# Patient Record
Sex: Male | Born: 1955 | Race: White | Hispanic: No | Marital: Single | Smoking: Current every day smoker
Health system: Southern US, Community
[De-identification: ages and names within clinical notes are randomized; demographics above are authoritative.]

## PROBLEM LIST (undated history)

## (undated) DIAGNOSIS — F411 Generalized anxiety disorder: Secondary | ICD-10-CM

## (undated) DIAGNOSIS — G8929 Other chronic pain: Secondary | ICD-10-CM

## (undated) DIAGNOSIS — N189 Chronic kidney disease, unspecified: Secondary | ICD-10-CM

## (undated) DIAGNOSIS — M545 Low back pain, unspecified: Secondary | ICD-10-CM

## (undated) DIAGNOSIS — I252 Old myocardial infarction: Secondary | ICD-10-CM

## (undated) DIAGNOSIS — I4891 Unspecified atrial fibrillation: Secondary | ICD-10-CM

## (undated) DIAGNOSIS — Z9981 Dependence on supplemental oxygen: Secondary | ICD-10-CM

## (undated) DIAGNOSIS — I639 Cerebral infarction, unspecified: Secondary | ICD-10-CM

## (undated) DIAGNOSIS — S61419A Laceration without foreign body of unspecified hand, initial encounter: Secondary | ICD-10-CM

## (undated) DIAGNOSIS — G40909 Epilepsy, unspecified, not intractable, without status epilepticus: Secondary | ICD-10-CM

## (undated) DIAGNOSIS — M1990 Unspecified osteoarthritis, unspecified site: Secondary | ICD-10-CM

## (undated) DIAGNOSIS — G4733 Obstructive sleep apnea (adult) (pediatric): Secondary | ICD-10-CM

## (undated) DIAGNOSIS — R7303 Prediabetes: Secondary | ICD-10-CM

## (undated) DIAGNOSIS — Z8673 Personal history of transient ischemic attack (TIA), and cerebral infarction without residual deficits: Secondary | ICD-10-CM

## (undated) DIAGNOSIS — R51 Headache: Secondary | ICD-10-CM

## (undated) DIAGNOSIS — I10 Essential (primary) hypertension: Secondary | ICD-10-CM

## (undated) DIAGNOSIS — R918 Other nonspecific abnormal finding of lung field: Secondary | ICD-10-CM

## (undated) DIAGNOSIS — R4182 Altered mental status, unspecified: Secondary | ICD-10-CM

## (undated) DIAGNOSIS — IMO0002 Reserved for concepts with insufficient information to code with codable children: Secondary | ICD-10-CM

## (undated) DIAGNOSIS — K7010 Alcoholic hepatitis without ascites: Secondary | ICD-10-CM

## (undated) DIAGNOSIS — B1921 Unspecified viral hepatitis C with hepatic coma: Secondary | ICD-10-CM

## (undated) DIAGNOSIS — K219 Gastro-esophageal reflux disease without esophagitis: Secondary | ICD-10-CM

## (undated) DIAGNOSIS — J45909 Unspecified asthma, uncomplicated: Secondary | ICD-10-CM

## (undated) DIAGNOSIS — H269 Unspecified cataract: Secondary | ICD-10-CM

## (undated) DIAGNOSIS — E440 Moderate protein-calorie malnutrition: Secondary | ICD-10-CM

## (undated) DIAGNOSIS — G43909 Migraine, unspecified, not intractable, without status migrainosus: Secondary | ICD-10-CM

## (undated) DIAGNOSIS — I8290 Acute embolism and thrombosis of unspecified vein: Secondary | ICD-10-CM

## (undated) DIAGNOSIS — R011 Cardiac murmur, unspecified: Secondary | ICD-10-CM

## (undated) DIAGNOSIS — R519 Headache, unspecified: Secondary | ICD-10-CM

## (undated) DIAGNOSIS — I8500 Esophageal varices without bleeding: Secondary | ICD-10-CM

## (undated) DIAGNOSIS — K7460 Unspecified cirrhosis of liver: Secondary | ICD-10-CM

## (undated) HISTORY — DX: Unspecified cataract: H26.9

## (undated) HISTORY — PX: PARACENTESIS: SHX844

## (undated) HISTORY — DX: Old myocardial infarction: I25.2

## (undated) HISTORY — DX: Personal history of transient ischemic attack (TIA), and cerebral infarction without residual deficits: Z86.73

## (undated) HISTORY — DX: Epilepsy, unspecified, not intractable, without status epilepticus: G40.909

## (undated) HISTORY — PX: HAND SURGERY: SHX662

## (undated) HISTORY — DX: Reserved for concepts with insufficient information to code with codable children: IMO0002

## (undated) HISTORY — PX: FOOT FRACTURE SURGERY: SHX645

## (undated) HISTORY — DX: Obstructive sleep apnea (adult) (pediatric): G47.33

## (undated) HISTORY — PX: CLAVICLE SURGERY: SHX598

## (undated) HISTORY — DX: Alcoholic hepatitis without ascites: K70.10

## (undated) HISTORY — DX: Unspecified atrial fibrillation: I48.91

## (undated) HISTORY — DX: Unspecified viral hepatitis C with hepatic coma: B19.21

## (undated) HISTORY — DX: Essential (primary) hypertension: I10

## (undated) HISTORY — DX: Altered mental status, unspecified: R41.82

## (undated) HISTORY — DX: Generalized anxiety disorder: F41.1

## (undated) HISTORY — DX: Moderate protein-calorie malnutrition: E44.0

---

## 1978-04-25 DIAGNOSIS — S61419A Laceration without foreign body of unspecified hand, initial encounter: Secondary | ICD-10-CM

## 1978-04-25 HISTORY — DX: Laceration without foreign body of unspecified hand, initial encounter: S61.419A

## 1978-12-25 HISTORY — PX: HAND SURGERY: SHX662

## 2006-12-24 ENCOUNTER — Ambulatory Visit: Payer: Self-pay | Admitting: Cardiology

## 2007-04-02 ENCOUNTER — Encounter: Admission: RE | Admit: 2007-04-02 | Discharge: 2007-04-02 | Payer: Self-pay | Admitting: Neurology

## 2007-05-28 ENCOUNTER — Ambulatory Visit: Payer: Self-pay | Admitting: Physical Medicine & Rehabilitation

## 2007-05-28 ENCOUNTER — Encounter
Admission: RE | Admit: 2007-05-28 | Discharge: 2007-08-26 | Payer: Self-pay | Admitting: Physical Medicine & Rehabilitation

## 2007-08-20 ENCOUNTER — Ambulatory Visit: Payer: Self-pay | Admitting: Physical Medicine & Rehabilitation

## 2007-09-13 ENCOUNTER — Encounter
Admission: RE | Admit: 2007-09-13 | Discharge: 2007-12-12 | Payer: Self-pay | Admitting: Physical Medicine & Rehabilitation

## 2007-10-19 ENCOUNTER — Ambulatory Visit: Payer: Self-pay | Admitting: Physical Medicine & Rehabilitation

## 2008-02-05 ENCOUNTER — Encounter
Admission: RE | Admit: 2008-02-05 | Discharge: 2008-02-06 | Payer: Self-pay | Admitting: Physical Medicine & Rehabilitation

## 2008-02-06 ENCOUNTER — Ambulatory Visit: Payer: Self-pay | Admitting: Physical Medicine & Rehabilitation

## 2008-05-02 ENCOUNTER — Encounter
Admission: RE | Admit: 2008-05-02 | Discharge: 2008-05-05 | Payer: Self-pay | Admitting: Physical Medicine & Rehabilitation

## 2008-05-05 ENCOUNTER — Ambulatory Visit: Payer: Self-pay | Admitting: Physical Medicine & Rehabilitation

## 2008-06-05 ENCOUNTER — Encounter
Admission: RE | Admit: 2008-06-05 | Discharge: 2008-06-09 | Payer: Self-pay | Admitting: Physical Medicine & Rehabilitation

## 2008-06-09 ENCOUNTER — Ambulatory Visit: Payer: Self-pay | Admitting: Physical Medicine & Rehabilitation

## 2008-11-24 ENCOUNTER — Ambulatory Visit (HOSPITAL_COMMUNITY): Admission: RE | Admit: 2008-11-24 | Discharge: 2008-11-24 | Payer: Self-pay | Admitting: Internal Medicine

## 2009-10-21 ENCOUNTER — Encounter: Admission: RE | Admit: 2009-10-21 | Discharge: 2009-10-21 | Payer: Self-pay | Admitting: Neurology
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
## 2010-09-07 NOTE — Assessment & Plan Note (Signed)
[name] returned to the clinic today for followup evaluation. I first  and last saw the patient in this office May 28, 2007 for evaluation  and treatment of chronic left upper extremity and low back pain. At that  time, we had decided to start him on Lyrica at 50 mg q. day, and he  reports that he has been using that with good relief. He continues to  use his Endocet, approximately five tablets per day and also gets  relief.   The patient reports that Dr. Betti Cruz, his psychiatrist, is planning to  start him on Seroquel and Cymbalta for sleep and depression  respectively. The patient does need a refill on his Lyrica and Endocet  in the office today.   MEDICATIONS:  1. Endocet one tablet 5 times per day p.r.n.  2. Lyrica 50 mg q. day.  3. Cymbalta q. day.  4. Seroquel q. day.   REVIEW OF SYSTEMS:  Noncontributory.   PHYSICAL EXAMINATION:  Reasonably well-appearing middle-aged adult male  in mild acute discomfort. Blood pressure is 143/81 with a pulse of 58,  respiratory rate 18, and O2 saturation 97% on room air. He has 4+ to 5-  /5 strength throughout the bilateral upper and lower extremities.  Cervical range of motion and lumbar range of motion were within  functional limits.   IMPRESSION:  1. Chronic left upper extremity/clavicle pain after prior clavicle      fracture June 2008 with chronic low back pain, nonsurgical.  2. Chronic bilateral feet pain.  3. Moderate to severe cervical spondylosis/degenerative disk disease.   In the office today we did refill the patient's Lyrica with refills  along with his Endocet each as of today June 27, 2007. We will plan on  seeing him in followup at the end of April 2009 with refills prior to  that appointment as necessary.           ______________________________  Ellwood Dense, M.D.     DC/MedQ  D:  06/27/2007 11:54:30  T:  06/27/2007 12:19:52  Job #:  [id number]

## 2010-09-07 NOTE — Assessment & Plan Note (Signed)
[name] returns to clinic today for followup evaluation.  He reports  that overall he is getting fair relief from his Percocet.  He has been  using it 5 times per day, but does report that he occasionally needs an  extra dose.  He is off his Ambien completely at this time.  Continues on  Lyrica, Cymbalta, and Seroquel.  The patient continues to have problems  regarding transportation on a regular basis.  Previously was allowed  coverage for transportation, but that has been discontinued.   REVIEW OF SYSTEMS:  Positive for wheezing, sleep apnea, shortness of  breath, coughing.   MEDICATIONS:  1. Percocet 10/325 one tablet 5 times per day p.r.n.  2. Lyrica 50 mg daily.  3. Cymbalta daily.  4. Seroquel daily.   PHYSICAL EXAMINATION:  Ill-appearing middle-aged adult male in mild  acute discomfort.  He reports the pain interfering with all activities  at the present time.  Blood pressure is 134/88 with pulse of 89,  respiratory rate 18, and O2 saturation 98% on room air.  He ambulates  with a single point cane.  He has 4+/5 strength throughout.   IMPRESSION:  1. Chronic left upper extremity/clavicle pain after prior clavicle      fracture, June 2008.  2. Chronic bilateral feet pain with moderate-to-severe cervical      spondylosis/degenerative disk disease.   In the office today, we did refill the patient's oxycodone and increased  it up to 6 per day on an as needed basis.  We will plan on seeing him in  followup in February 2010, when he needs his next refill, myself with  the nurse will see him on a monthly basis as transportation allow [place].           ______________________________  Ellwood Dense, M.D.     DC/MedQ  D:  05/05/2008 11:54:57  T:  05/06/2008 03:07:39  Job #:  [id number]

## 2010-09-07 NOTE — Assessment & Plan Note (Signed)
Darren Abbott returns to clinic today for followup evaluation.  He continues  to get good benefit from the Percocet used approximately 5 times per  day.  He does need a refill in the office today.  He also needs a refill  on his Lyrica.  His other medicines have stayed unchanged.   MEDICATIONS:  1. Percocet 10/325 one tablet 5 times per day p.r.n.  2. Lyrica 50 mg daily.  3. Cymbalta daily.  4. Seroquel daily.   REVIEW OF SYSTEMS:  Noncontributory.   PHYSICAL EXAMINATION:  A well-appearing middle-aged adult male in mild-  to-no acute discomfort.  Blood pressure 121/70 with a pulse of 85, respiratory rate 18, and O2  saturation 96% on room air.  He has 4+/5 strength with the bilateral upper and lower extremities.  Cervical range of motion was within functional limits.   IMPRESSION:  1. Chronic left upper extremity plus clavicle pain after prior      clavicle fracture in June 2008.  2. Chronic bilateral feet pain.  3. Moderate-to-severe cervical spondylosis/degenerative disk disease.   In the office today, we did refill the patient's Percocet and Lyrica  each as of today.  He will be able to get a refill on his Percocet  approximately on November 16, 2007, but he does need to call in for a refill  approximately 5 days prior to that.  We will plan on seeing him in  followup in this office in approximately 3 months' time with refills  prior to that appointment as necessary.           ______________________________  Ellwood Dense, M.D.     DC/MedQ  D:  10/19/2007 10:25:01  T:  10/20/2007 01:31:19  Job #:  [id number]
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
## 2010-09-07 NOTE — Group Therapy Note (Signed)
PURPOSE OF EVALUATION:  Evaluate and treat chronic left upper extremity  and low back pain.   HISTORY OF PRESENT ILLNESS:  Darren Abbott is a 55 year old right-sided male  referred to this office for evaluation of chronic left arm and low back  pain.  He is a very poor historian with very low education level, and  has a very poor understanding of treatment to date.  I have minimal  medical records that preceded the patient, and those were reviewed as  best possible with the patient in the office today.   It does appear that the patient had a fall at his home when he slipped  on grease while grilling outside, and that occurred October 13, 2006.  He  suffered a left clavicle fracture.  He subsequently had surgery by Dr.  Lorn Junes, an orthopedist in [place], approximately October 20, 2006.  He  reports that since that time, he has had pain involving his left  shoulder and arm.  He reports that it feels like the arm is on fire.  He also reports pain in his left jaw along with pain in his left arm,  and that pain is constant and persistent.  The patient's medical records  indicate that an orthopedic doctor told him that the bones were not  healing.  He was seen by Dr. Thad Ranger, a local neurologist on referral  from Dr. Charm Barges, the patient's primary care physician.  That appointment  occurred March 15, 2007.  Dr. Thad Ranger found no definite neurological  changes in the limb, but history was suspicious for chronic regional  pain syndrome.   April 02, 2007 the patient underwent three phase bone scan, which  showed increased activity through the left clavicle, probably his  bilateral ribs.  Left clavicle appearance was atypical of left clavicle  post plate screw fixation with questionable small bone fragments  possibly placed for fusion versus infection being present.   The patient underwent an MRI scan of his cervical spine April 04, 2007, which showed mild diffuse spondylosis/degenerative disk  disease C3-  T1 with posterior disk bulge/osteophyte complexes C3-4 and C7-T1.  There  was focal canal stenosis of approximately 33% with reduction in diameter  at T5-6 secondary to spondylosis and degenerative disk disease.  There  was mild/moderate neuroforaminal stenosis bilaterally at C5-6 placing  the exiting C6 nerve root at risk to the right and the exiting C7 nerve  root at risk to the right.   Patient reports that he has seen no other physicians for treatment other  than his primary care physician, Dr. Charm Barges, and Dr. Thad Ranger as noted  above.  The patient reports that when he did follow up with the  orthopedist in [place], the physician that he previously had seen was not  working at that office.  The patient himself told the physicians there  that he would seek care elsewhere.   Presently the patient reports that he has pain in his left clavicle  region even to light touch.  He complains of a burring pain of his left  arm, upper chest and left face.  He reports that pain is present all the  time.  He reports that there used to be pain with actually using his  left upper extremity, but now the pain is present even at rest.  He also  complains of bilateral foot pain secondary to traumatic injuries in the  past.  He is using Endocet 10/325 approximately 3 times per day and gets  relief for approximately 2 to 3 hours with each dose.  He reports that  he gained no relief from Lidoderm patch used in the past.  He has poor  memory regarding other medicines previously used, although he does bring  in a bottle of Neurontin 300 mg t.i.d., which he reports he is not  taking as it hurts his stomach.  He has not tried Lyrica as best he  can remember.  He does have approximately 21 tablets of Endocet  remaining from prior prescription from Dr. Charm Barges.   PAST MEDICAL HISTORY:  1. Headaches.  2. Anxiety.  3. Depression.  4. Chronic low back pain after fall in 2001 with neurosurgical       evaluation indicating no neurosurgical intervention.   FAMILY HISTORY:  Positive for diabetes mellitus and migraine.   ALLERGIES:  No known drug allergies.   SOCIAL HISTORY:  The patient is divorced and presently reports that he  is homeless, although he is living at a friend's home at least  temporarily until the friend's son is able to live there.  He is not  sure where he will live at that point.  He has an 8th grade education.  He reports that he can minimally read and do mathematics.  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  He reports that he smokes a half pack of cigarettes per day and reports  that he quit beer use in April of 2008.   MEDICATIONS:  1. Alprazolam 1 mg t.i.d.  2. Endocet 10/325 one tablet t.i.d. p.r.n.  3. Neurontin 300 mg 1 tablet t.i.d. (not taking at present).   PHYSICAL EXAMINATION:  Reasonably well appearing middle-aged adult male  in mild to moderate acute discomfort.  Height was 5 feet 9 inches.  Weight 168 pounds.  Blood pressure 119/79 with a pulse of 76.  Respiratory rate 18 and O2 saturation 97% on room air.  The patient ambulates without any assistive device, but complains of  pain in his back and his feet during ambulation.  Upper extremity range  of motion was full on the right side, but decreased to approximately 70  degrees of flexion and abduction in the left upper extremity.  He has  slightly prominent left clavicle compared to the right.  Sensation was  intact to light touch throughout the bilateral upper extremities.  He  has normal range of motion throughout the bilateral elbows, wrists and  hands.  Strength was 4+/5 in the right upper extremity and 4-/5 in the  left upper extremity.  Lower extremity exam showed 4-/5 strength in  flexion and extension, and ankle dorsiflexion with normal sensation.  Lumbar range of motion was slightly decreased in flexion and extension  with reasonable lateral  bending and rotation.  In the supine position,  straight leg raise was negative to 30 degrees with slight complaints of  tightness in his low back and hamstrings.  Range of motion was normal  bilaterally in the supine position.   IMPRESSION:  1. Chronic left upper extremity/clavicle pain after prior clavicle      fracture June 2008.  2. Chronic low back pain, nonsurgical.  3. Chronic bilateral feet pain.  4. Moderate to severe cervical spondylosis/degenerative disk disease.   In the office today, we did give the patient a new prescription for  Endocet 10/325 to be used 1 tablet 5 times per day p.r.n.  A total of  150  were prescribed as of June 01, 2007.  In the meantime, he will  use the present supply that he has up to a maximum of 5 tablets per day.  We also started him on Lyrica at 50 mg 1 tablet p.o. daily.  We will see  how he does on the Lyrica in addition to the [place], and then see him  in followup in approximately June 27, 2007.           ______________________________  Ellwood Dense, M.D.     DC/MedQ  D:  05/28/2007 13:24:28  T:  05/28/2007 17:32:12  Job #:  [id number]

## 2010-09-07 NOTE — Assessment & Plan Note (Signed)
Darren Abbott returns to clinic today for followup evaluation.  I last saw  the patient June 27, 2007.  At that time, we had him continue on Endocet  10/325 a maximum of 5 tablets per day.  He does have a slightly short  count in the office today.  Last script was for July 26, 2007.  He has a  supply of only approximately 5 tablets left, although he has not brought  in his bottle.  He reports that he has occasionally used an extra tablet  over the past month or so.   MEDICATIONS:  1. Percocet 10/325 one tablet 5 times per day p.r.n.  2. Lyrica 50 mg q. day.  3. Cymbalta q. day.  4. Seroquel q. day.   REVIEW OF SYSTEMS:  Noncontributory.   PHYSICAL EXAMINATION:  Reasonably well-appearing middle aged adult male  in mild acute discomfort.  Blood pressure 127/92 with a pulse of 94.  Pulse rate 22 and O2  saturation 97% on room air.  He is 4+/5 strength throughout the  bilateral upper and lower extremities.  Cervical range of motion was  within functional limits.   IMPRESSION:  1. Chronic left upper extremity/clavicle pain after prior clavicle      fracture June 2008.  2. Chronic bilateral feet pain.  3. Moderate to severe cervical spondylosis/degenerative disk disease.   In the office today, we did refill the patient's Percocet as of today  August 20, 2007.  I have warned him against over use of the medication.  He is allowed a maximum of 5 tablets per day and I have warned him  against over using the medication.  We will plan on seeing the patient  in follow up in approximately 2-3 months' time with refills prior to  that appointment as necessary.           ______________________________  Ellwood Dense, M.D.     DC/MedQ  D:  08/20/2007 14:38:46  T:  08/20/2007 15:32:29  Job #:  [id number]

## 2010-11-12 ENCOUNTER — Other Ambulatory Visit: Payer: Self-pay | Admitting: Diagnostic Neuroimaging

## 2010-11-12 DIAGNOSIS — R404 Transient alteration of awareness: Secondary | ICD-10-CM

## 2010-11-12 DIAGNOSIS — R569 Unspecified convulsions: Secondary | ICD-10-CM

## 2010-11-26 ENCOUNTER — Other Ambulatory Visit: Payer: Self-pay

## 2010-11-26 ENCOUNTER — Ambulatory Visit
Admission: RE | Admit: 2010-11-26 | Discharge: 2010-11-26 | Disposition: A | Discharge status: Home / Self Care | Payer: Medicaid Other | Source: Ambulatory Visit | Attending: Diagnostic Neuroimaging | Admitting: Diagnostic Neuroimaging

## 2010-11-26 DIAGNOSIS — R569 Unspecified convulsions: Secondary | ICD-10-CM

## 2010-11-26 DIAGNOSIS — R404 Transient alteration of awareness: Secondary | ICD-10-CM

## 2010-11-26 MED ORDER — GADOBENATE DIMEGLUMINE 529 MG/ML IV SOLN
15.0000 mL | Freq: Once | INTRAVENOUS | Status: AC | PRN
  Administered 2010-11-26: 15 mL via INTRAVENOUS

## 2011-04-26 DIAGNOSIS — G40909 Epilepsy, unspecified, not intractable, without status epilepticus: Secondary | ICD-10-CM

## 2011-04-26 HISTORY — DX: Epilepsy, unspecified, not intractable, without status epilepticus: G40.909

## 2011-05-30 ENCOUNTER — Encounter: Payer: Self-pay | Admitting: Cardiology

## 2011-05-31 ENCOUNTER — Encounter: Payer: Self-pay | Admitting: *Deleted

## 2011-05-31 ENCOUNTER — Other Ambulatory Visit: Payer: Self-pay | Admitting: Cardiology

## 2011-05-31 ENCOUNTER — Ambulatory Visit (INDEPENDENT_AMBULATORY_CARE_PROVIDER_SITE_OTHER): Payer: Medicaid Other | Admitting: Cardiology

## 2011-05-31 ENCOUNTER — Encounter: Payer: Self-pay | Admitting: Cardiology

## 2011-05-31 ENCOUNTER — Telehealth: Payer: Self-pay | Admitting: *Deleted

## 2011-05-31 DIAGNOSIS — R079 Chest pain, unspecified: Secondary | ICD-10-CM

## 2011-05-31 DIAGNOSIS — K7010 Alcoholic hepatitis without ascites: Secondary | ICD-10-CM

## 2011-05-31 DIAGNOSIS — G40909 Epilepsy, unspecified, not intractable, without status epilepticus: Secondary | ICD-10-CM

## 2011-05-31 DIAGNOSIS — I4891 Unspecified atrial fibrillation: Secondary | ICD-10-CM

## 2011-05-31 DIAGNOSIS — IMO0002 Reserved for concepts with insufficient information to code with codable children: Secondary | ICD-10-CM

## 2011-05-31 DIAGNOSIS — I10 Essential (primary) hypertension: Secondary | ICD-10-CM

## 2011-05-31 DIAGNOSIS — R4182 Altered mental status, unspecified: Secondary | ICD-10-CM

## 2011-05-31 DIAGNOSIS — I252 Old myocardial infarction: Secondary | ICD-10-CM

## 2011-05-31 DIAGNOSIS — F411 Generalized anxiety disorder: Secondary | ICD-10-CM

## 2011-05-31 DIAGNOSIS — Z8673 Personal history of transient ischemic attack (TIA), and cerebral infarction without residual deficits: Secondary | ICD-10-CM

## 2011-05-31 DIAGNOSIS — F17200 Nicotine dependence, unspecified, uncomplicated: Secondary | ICD-10-CM

## 2011-05-31 DIAGNOSIS — G4733 Obstructive sleep apnea (adult) (pediatric): Secondary | ICD-10-CM

## 2011-05-31 DIAGNOSIS — B1921 Unspecified viral hepatitis C with hepatic coma: Secondary | ICD-10-CM

## 2011-05-31 MED ORDER — METOPROLOL TARTRATE 50 MG PO TABS: ORAL_TABLET | ORAL | Status: DC

## 2011-05-31 NOTE — Patient Instructions (Addendum)
   Increase Metoprolol to 75mg  twice a day   Dobutamine Echo  If the results of your test are normal or stable, you will receive a letter.  If they are abnormal, the nurse will contact you by phone. Your physician wants you to follow up in: 6 months.  You will receive a reminder letter in the mail one-two months in advance.  If you don't receive a letter, please call our office to schedule the follow up appointment

## 2011-05-31 NOTE — Telephone Encounter (Signed)
Dobutamine Echo  Scheduled for 06-13-2011 @ [facility]

## 2011-05-31 NOTE — Telephone Encounter (Signed)
Pt has Medicaid only.  No precert required. °

## 2011-06-09 ENCOUNTER — Encounter: Payer: Self-pay | Admitting: Cardiology

## 2011-06-09 DIAGNOSIS — G40909 Epilepsy, unspecified, not intractable, without status epilepticus: Secondary | ICD-10-CM | POA: Insufficient documentation

## 2011-06-09 DIAGNOSIS — Z8673 Personal history of transient ischemic attack (TIA), and cerebral infarction without residual deficits: Secondary | ICD-10-CM | POA: Insufficient documentation

## 2011-06-09 DIAGNOSIS — K7010 Alcoholic hepatitis without ascites: Secondary | ICD-10-CM | POA: Insufficient documentation

## 2011-06-09 DIAGNOSIS — F411 Generalized anxiety disorder: Secondary | ICD-10-CM | POA: Insufficient documentation

## 2011-06-09 DIAGNOSIS — F17200 Nicotine dependence, unspecified, uncomplicated: Secondary | ICD-10-CM | POA: Insufficient documentation

## 2011-06-09 DIAGNOSIS — B1921 Unspecified viral hepatitis C with hepatic coma: Secondary | ICD-10-CM | POA: Insufficient documentation

## 2011-06-09 DIAGNOSIS — I4891 Unspecified atrial fibrillation: Secondary | ICD-10-CM | POA: Insufficient documentation

## 2011-06-09 DIAGNOSIS — I252 Old myocardial infarction: Secondary | ICD-10-CM | POA: Insufficient documentation

## 2011-06-09 DIAGNOSIS — G4733 Obstructive sleep apnea (adult) (pediatric): Secondary | ICD-10-CM | POA: Insufficient documentation

## 2011-06-09 DIAGNOSIS — I10 Essential (primary) hypertension: Secondary | ICD-10-CM | POA: Insufficient documentation

## 2011-06-09 DIAGNOSIS — R4182 Altered mental status, unspecified: Secondary | ICD-10-CM | POA: Insufficient documentation

## 2011-06-09 DIAGNOSIS — IMO0002 Reserved for concepts with insufficient information to code with codable children: Secondary | ICD-10-CM | POA: Insufficient documentation

## 2011-06-09 NOTE — Progress Notes (Signed)
Peyton Bottoms, MD, [facility] ABIM Board Certified in Adult Cardiovascular Medicine,Internal Medicine and Critical Care Medicine    CC: establish care per primary care physician in a patient with recent atrial fibrillation.  HPI:  The patient is a 56 year old male with multiple medical problems including a history of alcoholism and hepatitis C.  He has a seizure disorder.  Skin followed by [facility] and followed as an outpatient.he had a brief hospitalization for altered mental status and January 2013.  The patient also has a prior history of atrial fibrillation associated with palpitations chest pain and shortness of breath while he was at the beach in [place].  I have the EKG for review and his heart rate was 157 bpm with some nonspecific ST depression in leads V5 and V6. The patient does report significant shortness of breath both at rest and on exertion.  He also reports chest pain at variable times also reports diaphoresis.  Duration of chest pain is typically 30 min.  He described the chest pain as pressure and burning numbness.  Deep breath or coughing as well as activity will make her worse.  His EKG today demonstrates normal sinus rhythm. He is referred to the practice to establish cardiac care. He also has a history of COPD and apparently uses oxygen.  He unfortunately continues to smoke a pack a day. The patient has chronic pain syndrome from prior multiple traumas.  PMH: reviewed and listed in Problem List in Electronic Records (and see below) Past Medical History  Diagnosis Date  . Malnutrition of moderate degree   . 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 Altered mental status   . Acute alcoholic hepatitis     possible hepatic encephalopathy May 10, 2011  . Unspecified essential hypertension   . Unspecified epilepsy without mention of intractable epilepsy   . Obstructive sleep apnea (adult) (pediatric)   . Old myocardial infarction   . Anxiety state,  unspecified   . Transient ischemic attack (TIA), and cerebral infarction without residual deficits   . Tobacco use disorder   . Body mass index between 19-24, adult   . Atrial fibrillation     episode[place] [facility]   Past Surgical History  Procedure Date  . Left shoulder surgery     Allergies/SH/FHX : available in Electronic Records for review  No Known Allergies History   Social History  . Marital Status: Single    Spouse Name: N/A    Number of Children: N/A  . Years of Education: N/A   Occupational History  . Not on file.   Social History Main Topics  . Smoking status: Current Everyday Smoker -- 1.0 packs/day for 44 years    Types: Cigarettes  . Smokeless tobacco: Never Used   Comment: smokes 3 packs per day /now down to 1 PPD  . Alcohol Use: Yes     binges intermittently   . Drug Use: Not on file  . Sexually Active: Not on file   Other Topics Concern  . Not on file   Social History Narrative  . No narrative on file   No family history on file.  Medications: Current Outpatient Prescriptions  Medication Sig Dispense Refill  . acamprosate (CAMPRAL) 333 MG tablet Take 666 mg by mouth 3 (three) times daily.      Marland Kitchen ALPRAZolam (XANAX) 1 MG tablet Take 1 mg by mouth 3 (three) times daily.       . folic acid (FOLVITE) 1  MG tablet Take 1 mg by mouth daily.      . metoprolol (LOPRESSOR) 50 MG tablet Take 75mg  (1 1/2 tabs) twice a day  90 tablet  6  . mirtazapine (REMERON) 15 MG tablet Take 15 mg by mouth at bedtime.      . Multiple Vitamins-Minerals (CENTRUM SILVER PO) Take 1 tablet by mouth daily.      . naproxen sodium (ANAPROX) 220 MG tablet Take 220 mg by mouth as needed.      Marland Kitchen omeprazole (PRILOSEC) 20 MG capsule Take 20 mg by mouth daily.        ROS: No nausea or vomiting. No fever or chills.No melena or hematochezia.No bleeding.No claudication  Physical Exam: BP 120/78  Pulse 84  Ht 5\' 8"  (1.727 m)  Wt 167 lb (75.751 kg)  BMI 25.39 kg/m2  SpO2  98% General:well-nourished white male with some facial deformities from prior injury Neck:normal carotid upstroke and no carotid bruit.  No thyromegaly lung nodule of thyroid.  JVP is 5-6 cm Lungs:clear breath sounds bilaterally no wheezing. Cardiac:regular rate and rhythm with normal S1 and S2.  No pathological murmurs Vascular:no edema.normal distal pulses. Skin:warm and dry Physcologic:normal affect  12lead ZOX:WRUEAV sinus rhythm with no acute changes Limited bedside ECHO:N/A   Patient Active Problem List  Diagnoses  . Atrial fibrillationcurrently normal sinus rhythm  . Body mass index between 19-24, adult  . Tobacco use disorder  . Transient ischemic attack (TIA), and cerebral infarction without residual deficits  . Anxiety state, unspecified  . Old myocardial infarction  . Obstructive sleep apnea (adult) (pediatric)  . Unspecified epilepsy without mention of intractable epilepsy  . Unspecified essential hypertension  . Acute alcoholic hepatitis  . Altered mental status  . Unspecified viral hepatitis C with hepatic coma Chest pain    PLAN    The patient is currently normal sinus rhythm.  He is on beta blocker therapy.  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  The patient will be referred for dobutamine echocardiogram and if any abnormalities on the baseline images were also obtained a formal transthoracic echocardiogram.  The patient is not a good candidate for Coumadin given his history of falls, altered mental status and liver disease.   Further evaluation with cardiac catheterization may be necessary.  The patient has a grossly abnormal dobutamine stress echocardiogram.

## 2011-06-15 ENCOUNTER — Telehealth: Payer: Self-pay | Admitting: *Deleted

## 2011-06-15 NOTE — Telephone Encounter (Signed)
Patient walked into office requesting referral to Cardiologist with [facility] to be seen by 2/28.  Left message to return call.

## 2012-03-04 ENCOUNTER — Emergency Department (HOSPITAL_COMMUNITY)
Admission: EM | Admit: 2012-03-04 | Discharge: 2012-03-04 | Disposition: A | Discharge status: Home / Self Care | Payer: Medicaid Other | Attending: Emergency Medicine | Admitting: Emergency Medicine

## 2012-03-04 ENCOUNTER — Encounter (HOSPITAL_COMMUNITY): Payer: Self-pay | Admitting: Emergency Medicine

## 2012-03-04 DIAGNOSIS — F17200 Nicotine dependence, unspecified, uncomplicated: Secondary | ICD-10-CM | POA: Insufficient documentation

## 2012-03-04 DIAGNOSIS — Z8673 Personal history of transient ischemic attack (TIA), and cerebral infarction without residual deficits: Secondary | ICD-10-CM | POA: Insufficient documentation

## 2012-03-04 DIAGNOSIS — B1920 Unspecified viral hepatitis C without hepatic coma: Secondary | ICD-10-CM | POA: Insufficient documentation

## 2012-03-04 DIAGNOSIS — I252 Old myocardial infarction: Secondary | ICD-10-CM | POA: Insufficient documentation

## 2012-03-04 DIAGNOSIS — I4891 Unspecified atrial fibrillation: Secondary | ICD-10-CM | POA: Insufficient documentation

## 2012-03-04 DIAGNOSIS — F411 Generalized anxiety disorder: Secondary | ICD-10-CM | POA: Insufficient documentation

## 2012-03-04 DIAGNOSIS — I10 Essential (primary) hypertension: Secondary | ICD-10-CM | POA: Insufficient documentation

## 2012-03-04 DIAGNOSIS — K0889 Other specified disorders of teeth and supporting structures: Secondary | ICD-10-CM

## 2012-03-04 DIAGNOSIS — G4733 Obstructive sleep apnea (adult) (pediatric): Secondary | ICD-10-CM | POA: Insufficient documentation

## 2012-03-04 DIAGNOSIS — H6000 Abscess of external ear, unspecified ear: Secondary | ICD-10-CM

## 2012-03-04 DIAGNOSIS — K089 Disorder of teeth and supporting structures, unspecified: Secondary | ICD-10-CM | POA: Insufficient documentation

## 2012-03-04 DIAGNOSIS — H60399 Other infective otitis externa, unspecified ear: Secondary | ICD-10-CM | POA: Insufficient documentation

## 2012-03-04 MED ORDER — LIDOCAINE HCL (PF) 1 % IJ SOLN
5.0000 mL | Freq: Once | INTRAMUSCULAR | Status: AC
  Administered 2012-03-04: 5 mL via INTRADERMAL
  Filled 2012-03-04: qty 5

## 2012-03-04 MED ORDER — SULFAMETHOXAZOLE-TRIMETHOPRIM 800-160 MG PO TABS: 1.0000 | ORAL_TABLET | Freq: Two times a day (BID) | ORAL | Status: DC

## 2012-03-04 MED ORDER — HYDROCODONE-ACETAMINOPHEN 5-325 MG PO TABS: ORAL_TABLET | ORAL | Status: DC

## 2012-03-04 MED ORDER — OXYCODONE-ACETAMINOPHEN 5-325 MG PO TABS
1.0000 | ORAL_TABLET | Freq: Once | ORAL | Status: AC
  Administered 2012-03-04: 1 via ORAL
  Filled 2012-03-04: qty 1

## 2012-03-04 MED ORDER — SULFAMETHOXAZOLE-TMP DS 800-160 MG PO TABS
1.0000 | ORAL_TABLET | Freq: Once | ORAL | Status: AC
  Administered 2012-03-04: 1 via ORAL
  Filled 2012-03-04: qty 1

## 2012-03-04 NOTE — ED Notes (Signed)
Patient with c/o right dental pain and right ear pain. Patient with noted abscess behind right ear. H/o dental problems.

## 2012-03-04 NOTE — ED Provider Notes (Signed)
History     CSN: [id number]  Arrival date & time 03/04/12  4098   First MD Initiated Contact with Patient 03/04/12 1919      Chief Complaint  Patient presents with  . Otalgia  . Dental Pain    (Consider location/radiation/quality/duration/timing/severity/associated sxs/prior treatment) HPI Comments: Patient c/o localized swelling and pain to the posterior right ear.  He also c/o right sided lower dental pain for one week.  He was seen at another hospital and treated with antibiotic w/o relief.  He denies sore throat, rash, fever, vomiting, neck pain or difficulty swallowing.  Patient is a 56 y.o. male presenting with abscess. The history is provided by the patient.  Abscess  This is a new problem. The current episode started more than one week ago. The onset was gradual. The problem occurs continuously. The problem has been gradually worsening. The abscess is present on the right ear. The problem is mild. The abscess is characterized by painfulness and swelling. It is unknown what he was exposed to. Pertinent negatives include no decrease in physical activity, no fever, no vomiting, no congestion, no sore throat and no cough. Associated symptoms comments: Dental pain. His past medical history is significant for skin abscesses in family. There were no sick contacts. Recently, medical care has been given at another facility. Services received include medications given.    Past Medical History  Diagnosis Date  . Malnutrition of moderate degree   . 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 Altered mental status   . Acute alcoholic hepatitis     possible hepatic encephalopathy May 10, 2011  . Unspecified essential hypertension   . Unspecified epilepsy without mention of intractable epilepsy   . Obstructive sleep apnea (adult) (pediatric)   . Old myocardial infarction   . Anxiety state, unspecified   . Transient ischemic attack (TIA), and cerebral infarction without  residual deficits   . Tobacco use disorder   . Body mass index between 19-24, adult   . Atrial fibrillation     episode[place] [facility].    Past Surgical History  Procedure Date  . Left shoulder surgery     No family history on file.  History  Substance Use Topics  . Smoking status: Current Every Day Smoker -- 1.0 packs/day for 44 years    Types: Cigarettes  . Smokeless tobacco: Never Used     Comment: smokes 3 packs per day /now down to 1 PPD  . Alcohol Use: Yes     Comment: binges intermittently       Review of Systems  Constitutional: Negative for fever, chills, activity change and appetite change.  HENT: Positive for ear pain and dental problem. Negative for congestion, sore throat, facial swelling, trouble swallowing, neck pain, neck stiffness and ear discharge.   Respiratory: Negative for cough and shortness of breath.   Gastrointestinal: Negative for nausea and vomiting.  Musculoskeletal: Negative for joint swelling and arthralgias.  Skin: Positive for color change.       Abscess   Neurological: Negative for weakness, numbness and headaches.  Hematological: Negative for adenopathy.  All other systems reviewed and are negative.    Allergies  Review of patient's allergies indicates no known allergies.  Home Medications   Current Outpatient Rx  Name  Route  Sig  Dispense  Refill  . ALPRAZOLAM 1 MG PO TABS   Oral   Take 1 mg by mouth 3 (three) times daily.          Marland Kitchen  METOPROLOL TARTRATE 50 MG PO TABS      Take 75mg  (1 1/2 tabs) twice a day   90 tablet   6   . CENTRUM SILVER PO   Oral   Take 1 tablet by mouth daily.           BP 149/80  Pulse 71  Temp 97.8 F (36.6 C) (Oral)  Resp 18  Ht 5\' 7"  (1.702 m)  Wt 165 lb (74.844 kg)  BMI 25.84 kg/m2  Physical Exam  Nursing note and vitals reviewed. Constitutional: He is oriented to person, place, and time. He appears well-developed and well-nourished. No distress.  HENT:  Head: Normocephalic  and atraumatic. No trismus in the jaw.  Right Ear: Tympanic membrane and ear canal normal.  Left Ear: Tympanic membrane and ear canal normal.  Mouth/Throat: Uvula is midline, oropharynx is clear and moist and mucous membranes are normal. Dental caries present. No dental abscesses or uvula swelling.         ttp of the right lower premolars.  Dental decay is present.  Dime sized fluctuant mass to the posterior right ear.  No drainage, erythema or induration.  No cervical lymphadenopathy  Neck: Normal range of motion. Neck supple.  Cardiovascular: Normal rate, regular rhythm, normal heart sounds and intact distal pulses.   No murmur heard. Pulmonary/Chest: Effort normal and breath sounds normal. No respiratory distress.  Musculoskeletal: Normal range of motion.  Lymphadenopathy:    He has no cervical adenopathy.  Neurological: He is alert and oriented to person, place, and time. He exhibits normal muscle tone. Coordination normal.  Skin: Skin is warm and dry. No erythema.       See HENT exam    ED Course  Procedures (including critical care time)  Labs Reviewed - No data to display No results found.      MDM    INCISION AND DRAINAGE Performed by: Maxwell Caul. Consent: Verbal consent obtained. Risks and benefits: risks, benefits and alternatives were discussed Type: abscess  Body area: posterior right ear Anesthesia: local infiltration  Local anesthetic: lidocaine 1% w/o epinephrine  Anesthetic total: 2 ml  Complexity: complex Blunt dissection to break up loculations  Drainage: purulent  Drainage amount: moderate  Packing material: 1/4 in iodoform gauze  Patient tolerance: Patient tolerated the procedure well with no immediate complications.        Pt is feeling better after procedure.  Agrees to remove packing himself in 2 days or return here if sx's worsen.  Also agrees to f/u with dentist.    Prescribed: Bactrim DS norco #20   Bearl Talarico L. [place],  [place] 03/06/12 1338

## 2012-03-07 NOTE — ED Provider Notes (Signed)
Medical screening examination/treatment/procedure(s) were performed by non-physician practitioner and as supervising physician I was immediately available for consultation/collaboration.  Nolan Tuazon, MD 03/07/12 0704 

## 2013-02-01 ENCOUNTER — Ambulatory Visit: Payer: Self-pay | Admitting: Family Medicine

## 2013-02-04 ENCOUNTER — Encounter (INDEPENDENT_AMBULATORY_CARE_PROVIDER_SITE_OTHER): Payer: Self-pay

## 2013-02-04 ENCOUNTER — Encounter: Payer: Self-pay | Admitting: Family Medicine

## 2013-02-04 ENCOUNTER — Ambulatory Visit (INDEPENDENT_AMBULATORY_CARE_PROVIDER_SITE_OTHER): Payer: Medicaid Other | Admitting: Family Medicine

## 2013-02-04 VITALS — BP 144/79 | HR 88 | Temp 96.8°F | Ht 67.0 in | Wt 181.0 lb

## 2013-02-04 DIAGNOSIS — F329 Major depressive disorder, single episode, unspecified: Secondary | ICD-10-CM

## 2013-02-04 DIAGNOSIS — M25519 Pain in unspecified shoulder: Secondary | ICD-10-CM

## 2013-02-04 DIAGNOSIS — J342 Deviated nasal septum: Secondary | ICD-10-CM

## 2013-02-04 DIAGNOSIS — M549 Dorsalgia, unspecified: Secondary | ICD-10-CM

## 2013-02-04 DIAGNOSIS — M25512 Pain in left shoulder: Secondary | ICD-10-CM

## 2013-02-04 MED ORDER — METOPROLOL TARTRATE 50 MG PO TABS: ORAL_TABLET | ORAL | Status: DC

## 2013-02-04 MED ORDER — SERTRALINE HCL 50 MG PO TABS: 50.0000 mg | ORAL_TABLET | Freq: Every day | ORAL | Status: DC

## 2013-02-04 MED ORDER — ALPRAZOLAM 0.5 MG PO TABS: 0.5000 mg | ORAL_TABLET | Freq: Every evening | ORAL | Status: DC | PRN

## 2013-02-04 MED ORDER — HYDROCODONE-ACETAMINOPHEN 5-325 MG PO TABS: ORAL_TABLET | ORAL | Status: DC

## 2013-02-04 NOTE — Progress Notes (Signed)
  Subjective:    Patient ID: Darren Abbott, male    DOB: July 31, 1955, 57 y.o.   MRN: [id number]  HPI This 57 y.o. male presents for evaluation of complaints of deviated septum, back pain, left shoulder pain, And pain in legs.  He states he was involved in an accident and had to undergo some orthopedic surgery And he states he was then sent to pain management but he stopped seeing pain management 6 months ago. He is having severe pain and cannot sleep.  He has a lot of personal problems and family problems and  Is having some depression.  He states he wants a referral to get his nose fixed because it hurts and is hard to  Breathe out of.  He wants an orthopedic referral for his multiple orthopedic problems..  Review of Systems C/o multiple arthralgias, depression, and insomnia.   No chest pain, SOB, HA, dizziness, vision change, N/V, diarrhea, constipation, dysuria, urinary urgency or frequency or rash.  Objective:   Physical Exam Vital signs noted  Well developed well nourished male.  HEENT - Head atraumatic Normocephalic                Eyes - PERRLA, Conjuctiva - clear Sclera- Clear EOMI                Ears - EAC's Wnl TM's Wnl Gross Hearing WNL                Nose - Severely deviated septum.                Throat - oropharanx wnl Respiratory - Lungs CTA bilateral Cardiac - RRR S1 and S2 without murmur MS - Left scapula with deformity and TTP.       Assessment & Plan:  Back pain - Plan: metoprolol (LOPRESSOR) 50 MG tablet, HYDROcodone-acetaminophen (NORCO/VICODIN) 5-325 MG per tablet, Ambulatory referral to Orthopedic Surgery. He states he was supposed to get some surgery by one of the Orthopedic Surgeons in [place] but he states he left.  Left shoulder pain - Plan: metoprolol (LOPRESSOR) 50 MG tablet, HYDROcodone-acetaminophen (NORCO/VICODIN) 5-325 MG per tablet, Ambulatory referral to Orthopedic Surgery  Depression - Plan: sertraline (ZOLOFT) 50 MG tablet, ALPRAZolam (XANAX) 0.5  MG tablet  Deviated septum - Plan: Ambulatory referral to ENT  Follow up in 3 weeks for CPE labs and refill on meds if needed.  Deatra Canter FNP

## 2013-02-04 NOTE — Patient Instructions (Signed)
Shoulder Pain  The shoulder is the joint that connects your arms to your body. The bones that form the shoulder joint include the upper arm bone (humerus), the shoulder blade (scapula), and the collarbone (clavicle). The top of the humerus is shaped like a ball and fits into a rather flat socket on the scapula (glenoid cavity). A combination of muscles and strong, fibrous tissues that connect muscles to bones (tendons) support your shoulder joint and hold the ball in the socket. Small, fluid-filled sacs (bursae) are located in different areas of the joint. They act as cushions between the bones and the overlying soft tissues and help reduce friction between the gliding tendons and the bone as you move your arm. Your shoulder joint allows a wide range of motion in your arm. This range of motion allows you to do things like scratch your back or throw a ball. However, this range of motion also makes your shoulder more prone to pain from overuse and injury.  Causes of shoulder pain can originate from both injury and overuse and usually can be grouped in the following four categories:   Redness, swelling, and pain (inflammation) of the tendon (tendinitis) or the bursae (bursitis).   Instability, such as a dislocation of the joint.   Inflammation of the joint (arthritis).   Broken bone (fracture).  HOME CARE INSTRUCTIONS    Apply ice to the sore area.   Put ice in a plastic bag.   Place a towel between your skin and the bag.   Leave the ice on for 15-20 minutes, 3-4 times per day for the first 2 days.   Stop using cold packs if they do not help with the pain.   If you have a shoulder sling or immobilizer, wear it as long as your caregiver instructs. Only remove it to shower or bathe. Move your arm as little as possible, but keep your hand moving to prevent swelling.   Squeeze a soft ball or foam pad as much as possible to help prevent swelling.   Only take over-the-counter or prescription medicines for pain,  discomfort, or fever as directed by your caregiver.  SEEK MEDICAL CARE IF:    Your shoulder pain increases, or new pain develops in your arm, hand, or fingers.   Your hand or fingers become cold and numb.   Your pain is not relieved with medicines.  SEEK IMMEDIATE MEDICAL CARE IF:    Your arm, hand, or fingers are numb or tingling.   Your arm, hand, or fingers are significantly swollen or turn white or blue.  MAKE SURE YOU:    Understand these instructions.   Will watch your condition.   Will get help right away if you are not doing well or get worse.  Document Released: 01/19/2005 Document Revised: 01/04/2012 Document Reviewed: 03/26/2011  ExitCare Patient Information 2014 [employer].

## 2013-02-25 ENCOUNTER — Ambulatory Visit (INDEPENDENT_AMBULATORY_CARE_PROVIDER_SITE_OTHER): Payer: Medicaid Other | Admitting: Family Medicine

## 2013-02-25 ENCOUNTER — Encounter: Payer: Self-pay | Admitting: Family Medicine

## 2013-02-25 VITALS — BP 138/82 | HR 93 | Temp 97.5°F | Ht 67.0 in | Wt 181.0 lb

## 2013-02-25 DIAGNOSIS — Z0000 Encounter for general adult medical examination without abnormal findings: Secondary | ICD-10-CM

## 2013-02-25 DIAGNOSIS — F329 Major depressive disorder, single episode, unspecified: Secondary | ICD-10-CM

## 2013-02-25 DIAGNOSIS — M25512 Pain in left shoulder: Secondary | ICD-10-CM

## 2013-02-25 DIAGNOSIS — M549 Dorsalgia, unspecified: Secondary | ICD-10-CM

## 2013-02-25 DIAGNOSIS — M25519 Pain in unspecified shoulder: Secondary | ICD-10-CM

## 2013-02-25 DIAGNOSIS — F411 Generalized anxiety disorder: Secondary | ICD-10-CM

## 2013-02-25 LAB — POCT CBC
Granulocyte percent: 61.4 %G (ref 37–80)
HCT, POC: 50.8 % (ref 43.5–53.7)
Hemoglobin: 16.6 g/dL (ref 14.1–18.1)
Lymph, poc: 1.8 (ref 0.6–3.4)
MCH, POC: 29.2 pg (ref 27–31.2)
MCHC: 32.6 g/dL (ref 31.8–35.4)
MCV: 89.3 fL (ref 80–97)
MPV: 6.9 fL (ref 0–99.8)
POC Granulocyte: 3.6 (ref 2–6.9)
POC LYMPH PERCENT: 30.9 %L (ref 10–50)
Platelet Count, POC: 97 10*3/uL — AB (ref 142–424)
RBC: 5.7 M/uL (ref 4.69–6.13)
RDW, POC: 15.1 %
WBC: 5.9 10*3/uL (ref 4.6–10.2)

## 2013-02-25 MED ORDER — HYDROCODONE-ACETAMINOPHEN 5-325 MG PO TABS: ORAL_TABLET | ORAL | Status: DC

## 2013-02-25 MED ORDER — ALPRAZOLAM 0.5 MG PO TABS: 0.5000 mg | ORAL_TABLET | Freq: Every evening | ORAL | Status: DC | PRN

## 2013-02-25 NOTE — Patient Instructions (Signed)
Deviated Septum  Deviated septum is a shift of the nasal septum away from the midline. The nasal septum is a wall that divides the nasal cavity into halves. Normally, the nasal septum is straight and is located exactly in the middle of the nasal cavity. In many people, it is bent towards the left or right. Symptoms occur when there is a severe shift from the midline. Difficulty in breathing through the nose is the most common symptom. The crooked septum can block the drainage of the air-filled spaces within the bones of the face (sinuses) causing repeated sinus infection. Surgery can be done to correct the deviation. Surgery is usually not recommended in minors as the septum grows till 57 years of age.   SYMPTOMS   People with mild deviation of the nasal septum usually do not have any symptoms. Symptoms develop when the deviation is severe. The common symptoms are:   Blockage in one or both sides of the nose.   Nasal congestion or stuffy nose.   Frequent nosebleeds.   Frequent sinus infection.   Headache.   Facial pain.   Excess collection of mucus at the back of the throat or nose (postnasal drip).   Noisy breathing while sleeping.  Some people with mild deviation have symptoms only when they get common cold. The deviated septum causes frequent inflammation of the sinuses (sinusitis) by blocking the sinus.  DIAGNOSIS    During the first visit, your caregiver will ask about your symptoms.   Your caregiver will examine the appearance of your nose.   You will be asked about any previous injury that may have caused damage to your nose.   You will be asked about any previous nose surgeries.   Your caregiver will check the position of your nasal septum.   Your caregiver may use a flashlight and a device used to widen your nostril (nasal speculum).   Your caregiver may suggest other tests, such as a computerized X-ray scan (CT or CAT scan), if needed.  TREATMENT    Mild deviation of the septum does not need any treatment. Your caregiver may advise a surgery to correct the deviated septum (septoplasty), if the deviation is severe. This surgery is done through your nose. No bruising will be visible outside. Your caregiver may instruct you to stop taking aspirin and other blood thinning medicines. Listen to your caregiver regarding those medicines. You will be given local or general anesthesia. Your surgeon will remove or adjust the septum and place it in the correct position. The entire procedure takes about 1 to 2 hours. You will be given a nasal pack to control the bleeding. The patient typically keeps the nasal pack in until the first follow-up visit after surgery. Hospital admission is not needed for this. Your surgeon might also do a sinus surgery along with this surgery, if needed. Sinus surgery entails opening the ostia (openings) of the sinuses to allow better drainage. Once deviated septum has been fixed, larger openings allow for better drainage. This leads to decreased incidence in sinus infections.  HOME CARE INSTRUCTIONS    Follow your caregiver's advice after surgery.   Do not blow your nose.   Do not hold your breath.   Use ice packs to reduce pain.   For a few days, tighten your muscles while bearing down during bowel movement.   If bleeding occurs that exceeds nasal packing, and it does not stop with gentle pressure, call your caregiver.  MAKE SURE YOU:      Understand these instructions.   Will watch your condition.   Will get help right away if you are not doing well or get worse.  Document Released: 02/06/2007 Document Revised: 07/04/2011 Document Reviewed: 02/06/2007  [employer] Patient Information 2014 [employer].

## 2013-02-25 NOTE — Progress Notes (Signed)
  Subjective:    Patient ID: Darren Abbott, male    DOB: [birth year], 57 y.o.   MRN: [id number]  HPI  This 57 y.o. male presents for evaluation of chronic pain.  He has hx of shoulder injury and Is awaiting an ortho referral. He has deviated septum and is awaiting ENT referral.  He  Has hx of depression.  He states his father is dying.  He is needing refill on pain meds and  xanax.  Review of Systems C/o chronic pain and anxiety.   No chest pain, SOB, HA, dizziness, vision change, N/V, diarrhea, constipation, dysuria, urinary urgency or frequency, myalgias, arthralgias or rash.  Objective:   Physical Exam  Vital signs noted  Well developed well nourished male.  HEENT - Head atraumatic Normocephalic                Eyes - PERRLA, Conjuctiva - clear Sclera- Clear EOMI                Ears - EAC's Wnl TM's Wnl Gross Hearing WNL                Nose - Nares patent                 Throat - oropharanx wnl Respiratory - Lungs CTA bilateral Cardiac - RRR S1 and S2 without murmur GI - Abdomen soft Nontender and bowel sounds active x 4 Extremities - No edema. Neuro - Grossly intact.      Assessment & Plan:  Left shoulder pain - Plan: HYDROcodone-acetaminophen (NORCO/VICODIN) 5-325 MG per tablet  Back pain - Plan: HYDROcodone-acetaminophen (NORCO/VICODIN) 5-325 MG per tablet  Depression - Plan: ALPRAZolam (XANAX) 0.5 MG tablet  Anxiety state, unspecified - Plan: ALPRAZolam (XANAX) 0.5 MG tablet  Routine general medical examination at a health care facility - Plan: POCT CBC, CMP14+EGFR, Lipid panel, PSA, total and free, Thyroid Panel With TSH  Deatra Canter FNP

## 2013-02-26 LAB — CMP14+EGFR
ALT: 37 IU/L (ref 0–44)
AST: 84 IU/L — ABNORMAL HIGH (ref 0–40)
Albumin/Globulin Ratio: 1.2 (ref 1.1–2.5)
Albumin: 3.7 g/dL (ref 3.5–5.5)
Alkaline Phosphatase: 167 IU/L — ABNORMAL HIGH (ref 39–117)
BUN/Creatinine Ratio: 4 — ABNORMAL LOW (ref 9–20)
BUN: 3 mg/dL — ABNORMAL LOW (ref 6–24)
CO2: 23 mmol/L (ref 18–29)
Calcium: 8.6 mg/dL — ABNORMAL LOW (ref 8.7–10.2)
Chloride: 108 mmol/L (ref 97–108)
Creatinine, Ser: 0.72 mg/dL — ABNORMAL LOW (ref 0.76–1.27)
GFR calc Af Amer: 120 mL/min/{1.73_m2} (ref >59)
GFR calc non Af Amer: 104 mL/min/{1.73_m2} (ref >59)
Globulin, Total: 3.1 g/dL (ref 1.5–4.5)
Glucose: 118 mg/dL — ABNORMAL HIGH (ref 65–99)
Potassium: 3.9 mmol/L (ref 3.5–5.2)
Sodium: 146 mmol/L — ABNORMAL HIGH (ref 134–144)
Total Bilirubin: 1.6 mg/dL — ABNORMAL HIGH (ref 0.0–1.2)
Total Protein: 6.8 g/dL (ref 6.0–8.5)

## 2013-02-26 LAB — PSA, TOTAL AND FREE
PSA, Free Pct: 33.5 %
PSA, Free: 0.57 ng/mL
PSA: 1.7 ng/mL (ref 0.0–4.0)

## 2013-02-26 LAB — LIPID PANEL
Chol/HDL Ratio: 3.9 ratio units (ref 0.0–5.0)
Cholesterol, Total: 160 mg/dL (ref 100–199)
HDL: 41 mg/dL (ref >39)
LDL Calculated: 74 mg/dL (ref 0–99)
Triglycerides: 224 mg/dL — ABNORMAL HIGH (ref 0–149)
VLDL Cholesterol Cal: 45 mg/dL — ABNORMAL HIGH (ref 5–40)

## 2013-02-26 LAB — THYROID PANEL WITH TSH
Free Thyroxine Index: 1.6 (ref 1.2–4.9)
T3 Uptake Ratio: 25 % (ref 24–39)
T4, Total: 6.4 ug/dL (ref 4.5–12.0)
TSH: 0.529 u[IU]/mL (ref 0.450–4.500)

## 2013-02-27 ENCOUNTER — Telehealth: Payer: Self-pay | Admitting: Family Medicine

## 2013-03-13 ENCOUNTER — Other Ambulatory Visit: Payer: Self-pay

## 2013-03-13 DIAGNOSIS — J342 Deviated nasal septum: Secondary | ICD-10-CM

## 2013-03-25 HISTORY — PX: ESOPHAGOGASTRODUODENOSCOPY: SHX1529

## 2013-04-03 ENCOUNTER — Encounter: Payer: Self-pay | Admitting: Family Medicine

## 2013-04-03 ENCOUNTER — Encounter (INDEPENDENT_AMBULATORY_CARE_PROVIDER_SITE_OTHER): Payer: Self-pay

## 2013-04-03 ENCOUNTER — Ambulatory Visit (INDEPENDENT_AMBULATORY_CARE_PROVIDER_SITE_OTHER): Payer: Medicaid Other | Admitting: Family Medicine

## 2013-04-03 VITALS — BP 163/79 | HR 53 | Temp 97.1°F | Ht 67.0 in | Wt 168.8 lb

## 2013-04-03 DIAGNOSIS — K219 Gastro-esophageal reflux disease without esophagitis: Secondary | ICD-10-CM

## 2013-04-03 DIAGNOSIS — M549 Dorsalgia, unspecified: Secondary | ICD-10-CM

## 2013-04-03 DIAGNOSIS — F411 Generalized anxiety disorder: Secondary | ICD-10-CM

## 2013-04-03 DIAGNOSIS — F329 Major depressive disorder, single episode, unspecified: Secondary | ICD-10-CM

## 2013-04-03 DIAGNOSIS — M25519 Pain in unspecified shoulder: Secondary | ICD-10-CM

## 2013-04-03 DIAGNOSIS — M25512 Pain in left shoulder: Secondary | ICD-10-CM

## 2013-04-03 MED ORDER — ALPRAZOLAM 0.5 MG PO TABS: 0.5000 mg | ORAL_TABLET | Freq: Every evening | ORAL | Status: DC | PRN

## 2013-04-03 MED ORDER — HYDROCODONE-ACETAMINOPHEN 5-325 MG PO TABS: ORAL_TABLET | ORAL | Status: DC

## 2013-04-03 MED ORDER — SERTRALINE HCL 50 MG PO TABS: 50.0000 mg | ORAL_TABLET | Freq: Every day | ORAL | Status: DC

## 2013-04-03 MED ORDER — PANTOPRAZOLE SODIUM 40 MG PO TBEC: 40.0000 mg | DELAYED_RELEASE_TABLET | Freq: Every day | ORAL | Status: DC

## 2013-04-03 MED ORDER — METOPROLOL TARTRATE 50 MG PO TABS: ORAL_TABLET | ORAL | Status: DC

## 2013-04-04 NOTE — Progress Notes (Signed)
Subjective:    Patient ID: Darren Abbott, male    DOB: March 31, 1956, 57 y.o.   MRN: [id number]  HPI  This 57 y.o. male presents for evaluation of recent hospitalization.  He evidently collapsed and was Hospitalized in [place].  He has brought some paperwork from GI in [place].  The Report states he had bleeding varicies and was found to be in atrial fibrillation with RVR and was Then taken to the ED where he was hospitalized for GI bleed as well.  He evidently had some Banding surgery and once stable was discharged.  He has been rx'd protonix and metoprolol. He has hx of alcoholism and cirrhosis. He states he doesn't drink anymore but states he feels like He may because he is under a lot of stress.  He is very anxious and needs refills.  He brings in rx's from The hospital but states he cannot get them filled.  He states he is frustrated with his GI doctor and Wants another referral.  He states the GI who did his banding of his varicies was in [place] and he sees GI in [place].  He is rambling on about how his GI doctor yelled at him and he is upset.  He has been Referred to ENT for severe nasal deviation and he cannot remember to go.  Review of Systems C/o left shoulder pain and anxiety. No chest pain, SOB, HA, dizziness, vision change, N/V, diarrhea, constipation, dysuria, urinary urgency or frequency, myalgias, arthralgias or rash.     Objective:   Physical Exam Vital signs noted  Well developed well nourished male.  HEENT - Head atraumatic Normocephalic                Eyes - PERRLA, Conjuctiva - clear Sclera- Clear EOMI                Ears - EAC's Wnl TM's Wnl Gross Hearing WNL                Nose - Severe nasal deviation.                Throat - oropharanx wnl Respiratory - Lungs CTA bilateral Cardiac - RRR S1 and S2 without murmur GI - Abdomen soft Nontender and bowel sounds active x 4 Extremities - No edema. Neuro - Grossly intact. Psych - Very anxious and rambles        Assessment & Plan:  Depression - Plan: ALPRAZolam (XANAX) 0.5 MG tablet, sertraline (ZOLOFT) 50 MG tablet. He is very anxious and instructed patient to go to pharmacy and take xanax and follow up in one week. Discussed with his friend that he may need psychiatry if not better.  Anxiety state, unspecified - Plan: ALPRAZolam (XANAX) 0.5 MG tablet  Left shoulder pain - Plan: HYDROcodone-acetaminophen (NORCO/VICODIN) 5-325 MG per tablet  Back pain - Plan: HYDROcodone-acetaminophen (NORCO/VICODIN) 5-325 MG per tablet,   GERD (gastroesophageal reflux disease) - Plan: pantoprazole (PROTONIX) 40 MG tablet.  Alcoholism - Instructed patient to abstain  Esophageal varicies - Instructed him to take metoprolol and if anymore bleeding then go to ED.  Atrial Fibrillation - No records and will need chart from [facility].  May need cardiology referral. He has normal rate and rhythm today.  HCV - He needs to see GI  Folllow up in one week and see how he is doing on his antianxiety medicine.  Will discuss further  Referral and plans of care then.  He is stable for now  and recommend we address his severe  Anxiety today and then will cover his other many co-morbids  Deatra Canter FNP

## 2013-04-10 ENCOUNTER — Encounter: Payer: Self-pay | Admitting: Family Medicine

## 2013-04-10 ENCOUNTER — Ambulatory Visit (INDEPENDENT_AMBULATORY_CARE_PROVIDER_SITE_OTHER): Payer: Medicaid Other | Admitting: Family Medicine

## 2013-04-10 VITALS — BP 140/82 | HR 68 | Temp 97.2°F | Ht 67.0 in | Wt 169.0 lb

## 2013-04-10 DIAGNOSIS — I4891 Unspecified atrial fibrillation: Secondary | ICD-10-CM

## 2013-04-10 DIAGNOSIS — J342 Deviated nasal septum: Secondary | ICD-10-CM

## 2013-04-10 DIAGNOSIS — K7460 Unspecified cirrhosis of liver: Secondary | ICD-10-CM

## 2013-04-10 NOTE — Patient Instructions (Signed)
Atrial Fibrillation Atrial fibrillation is a type of irregular heart rhythm (arrhythmia). During atrial fibrillation, the upper chambers of the heart (atria) quiver continuously in a chaotic pattern. This causes an irregular and often rapid heart rate.  Atrial fibrillation is the result of the heart becoming overloaded with disorganized signals that tell it to beat. These signals are normally released one at a time by a part of the right atrium called the sinoatrial node. They then travel from the atria to the lower chambers of the heart (ventricles), causing the atria and ventricles to contract and pump blood as they pass. In atrial fibrillation, parts of the atria outside of the sinoatrial node also release these signals. This results in two problems. First, the atria receive so many signals that they do not have time to fully contract. Second, the ventricles, which can only receive one signal at a time, beat irregularly and out of rhythm with the atria.  There are three types of atrial fibrillation:   Paroxysmal Paroxysmal atrial fibrillation starts suddenly and stops on its own within a week.   Persistent Persistent atrial fibrillation lasts for more than a week. It may stop on its own or with treatment.   Permanent Permanent atrial fibrillation does not go away. Episodes of atrial fibrillation may lead to permanent atrial fibrillation.  Atrial fibrillation can prevent your heart from pumping blood normally. It increases your risk of stroke and can lead to heart failure.  CAUSES   Heart conditions, including a heart attack, heart failure, coronary artery disease, and heart valve conditions.   Inflammation of the sac that surrounds the heart (pericarditis).   Blockage of an artery in the lungs (pulmonary embolism).   Pneumonia or other infections.   Chronic lung disease.   Thyroid problems, especially if the thyroid is overactive (hyperthyroidism).   Caffeine, excessive alcohol  use, and use of some illegal drugs.   Use of some medications, including certain decongestants and diet pills.   Heart surgery.   Birth defects.  Sometimes, no cause can be found. When this happens, the atrial fibrillation is called lone atrial fibrillation. The risk of complications from atrial fibrillation increases if you have lone atrial fibrillation and you are age 57 years or older. RISK FACTORS  Heart failure.  Coronary artery disease  Diabetes mellitus.   High blood pressure (hypertension).   Obesity.   Other arrhythmias.   Increased age. SYMPTOMS   A feeling that your heart is beating rapidly or irregularly.   A feeling of discomfort or pain in your chest.   Shortness of breath.   Sudden lightheadedness or weakness.   Getting tired easily when exercising.   Urinating more often than normal (mainly when atrial fibrillation first begins).  In paroxysmal atrial fibrillation, symptoms may start and suddenly stop. DIAGNOSIS  Your caregiver may be able to detect atrial fibrillation when taking your pulse. Usually, testing is needed to diagnosis atrial fibrillation. Tests may include:   Electrocardiography. During this test, the electrical impulses of your heart are recorded while you are lying down.   Echocardiography. During echocardiography, sound waves are used to evaluate how blood flows through your heart.   Stress test. There is more than one type of stress test. If a stress test is needed, ask your caregiver about which type is best for you.   Chest X-ray exam.   Blood tests.   Computed tomography (CT).  TREATMENT   Treating any underlying conditions. For example, if you have an overactive  thyroid, treating the condition may correct atrial fibrillation.   Medication. Medications may be given to control a rapid heart rate or to prevent blood clots, heart failure, or a stroke.   Procedure to correct the rhythm of the  heart:  Electrical cardioversion. During electrical cardioversion, a controlled, low-energy shock is delivered to the heart through your skin. If you have chest pain, very low pressure blood pressure, or sudden heart failure, this procedure may need to be done as an emergency.  Catheter ablation. During this procedure, heart tissues that send the signals that cause atrial fibrillation are destroyed.  Maze or minimaze procedure. During this surgery, thin lines of heart tissue that carry the abnormal signals are destroyed. The maze procedure is an open-heart surgery. The minimaze procedure is a minimally invasive surgery. This means that small cuts are made to access the heart instead of a large opening.  Pulmonary venous isolation. During this surgery, tissue around the veins that carry blood from the lungs (pulmonary veins) is destroyed. This tissue is thought to carry the abnormal signals. HOME CARE INSTRUCTIONS   Take medications as directed by your caregiver.  Only take medications that your caregiver approves. Some medications can make atrial fibrillation worse or recur.  If blood thinners were prescribed by your caregiver, take them exactly as directed. Too much can cause bleeding. Too little and you will not have the needed protection against stroke and other problems.  Perform blood tests at home if directed by your caregiver.  Perform blood tests exactly as directed.   Quit smoking if you smoke.   Do not drink alcohol.   Do not drink caffeinated beverages such as coffee, soda, and some teas. You may drink decaffeinated coffee, soda, or tea.   Maintain a healthy weight. Do not use diet pills unless your caregiver approves. They may make heart problems worse.   Follow diet instructions as directed by your caregiver.   Exercise regularly as directed by your caregiver.   Keep all follow-up appointments. PREVENTION  The following substances can cause atrial fibrillation  to recur:   Caffeinated beverages.   Alcohol.   Certain medications, especially those used for breathing problems.   Certain herbs and herbal medications, such as those containing ephedra or ginseng.  Illegal drugs such as cocaine and amphetamines. Sometimes medications are given to prevent atrial fibrillation from recurring. Proper treatment of any underlying condition is also important in helping prevent recurrence.  SEEK MEDICAL CARE IF:  You notice a change in the rate, rhythm, or strength of your heartbeat.   You suddenly begin urinating more frequently.   You tire more easily when exerting yourself or exercising.  SEEK IMMEDIATE MEDICAL CARE IF:   You develop chest pain, abdominal pain, sweating, or weakness.  You feel sick to your stomach (nauseous).  You develop shortness of breath.  You suddenly develop swollen feet and ankles.  You feel dizzy.  You face or limbs feel numb or weak.  There is a change in your vision or speech. MAKE SURE YOU:   Understand these instructions.  Will watch your condition.  Will get help right away if you are not doing well or get worse. Document Released: 04/11/2005 Document Revised: 08/06/2012 Document Reviewed: 05/22/2012 [facility] Patient Information 2014 [place], [place].   Hepatitis C Hepatitis C is a viral infection of the liver. Infection may go undetected for months or years because symptoms may be absent or very mild. Chronic liver disease is the main danger of hepatitis  C. This may lead to scarring of the liver (cirrhosis), liver failure, and liver cancer. CAUSES  Hepatitis C is caused by the hepatitis C virus (HCV). Formerly, hepatitis C infections were most commonly transmitted through blood transfusions. In the early 1990s, routine testing of donated blood for hepatitis C and exclusion of blood that tests positive for HCV began. Now, HCV is most commonly transmitted from person to person through injection drug use,  sharing needles, or sex with an infected person. A caregiver may also get the infection from exposure to the blood of an infected patient by way of a cut or needle stick.  SYMPTOMS  Acute Phase Many cases of acute HCV infection are mild and cause few problems.Some people may not even realize they are sick.Symptoms in others may last a few weeks to several months and include:  Feeling very tired.  Loss of appetite.  Nausea.  Vomiting.  Abdominal pain.  Dark yellow urine.  Yellow skin and eyes (jaundice).  Itching of the skin. Chronic Phase  Between 50% to 85% of people who get HCV infection become "chronic carriers." They often have no symptoms, but the virus stays in their body.They may spread the virus to others and can get long-term liver disease.  Many people with chronic HCV infection remain healthy for many years. However, up to 1 in 5 chronically infected people may develop severe liver diseases including scarring of the liver (cirrhosis), liver failure, or liver cancer. DIAGNOSIS  Diagnosis of hepatitis C infection is made by testing blood for the presence of hepatitis C viral particles called RNA. Other tests may also be done to measure the status of current liver function, exclude other liver problems, or assess liver damage. TREATMENT  Treatment with many antiviral drugs is available and recommended for some patients with chronic HCV infection. Drug treatment is generally considered appropriate for patients who:  Are 90 years of age or older.  Have a positive test for HCV particles in the blood.  Have a liver tissue sample (biopsy) that shows chronic hepatitis and significant scarring (fibrosis).  Do not have signs of liver failure.  Have acceptable blood test results that confirm the wellness of other body organs.  Are willing to be treated and conform to treatment requirements.  Have no other circumstances that would prevent treatment from being recommended  (contraindications). All people who are offered and choose to receive drug treatment must understand that careful medical follow up for many months and even years is crucial in order to make successful care possible. The goal of drug treatment is to eliminate any evidence of HCV in the blood on a long-term basis. This is called a "sustained virologic response" or SVR. Achieving a SVR is associated with a decrease in the chance of life-threatening liver problems, need for a liver transplant, liver cancer rates, and liver-related complications. Successful treatment currently requires taking treatment drugs for at least 24 weeks and up to 72 weeks. An injected drug (interferon) given weekly and an oral antiviral medicine taken daily are usually prescribed. Side effects from these drugs are common and some may be very serious. Your response to treatment must be carefully monitored by both you and your caregiver throughout the entire treatment period. PREVENTION There is no vaccine for hepatitis C. The only way to prevent the disease is to reduce the risk of exposure to the virus.   Avoid sharing drug needles or personal items like toothbrushes, razors, and nail clippers with an infected person.  Healthcare workers need to avoid injuries and wear appropriate protective equipment such as gloves, gowns, and face masks when performing invasive medical or nursing procedures. HOME CARE INSTRUCTIONS  To avoid making your liver disease worse:  Strictly avoid drinking alcohol.  Carefully review all new prescriptions of medicines with your caregiver. Ask your caregiver which drugs you should avoid. The following drugs are toxic to the liver, and your caregiver may tell you to avoid them:  Isoniazid.  Methyldopa.  Acetaminophen.  Anabolic steroids (muscle-building drugs).  Erythromycin.  Oral contraceptives (birth control pills).  Check with your caregiver to make sure medicine you are currently taking  will not be harmful.  Periodic blood tests may be required. Follow your caregiver's advice about when you should have blood tests.  Avoid a sexual relationship until advised otherwise by your caregiver.  Avoid activities that could expose other people to your blood. Examples include sharing a toothbrush, nail clippers, razors, and needles.  Bed rest is not necessary, but it may make you feel better. Recovery time is not related to the amount of rest you receive.  This infection is contagious. Follow your caregiver's instructions in order to avoid spread of the infection. SEEK IMMEDIATE MEDICAL CARE IF:  You have increasing fatigue or weakness.  You have an oral temperature above 102 F (38.9 C), not controlled by medicine.  You develop loss of appetite, nausea, or vomiting.  You develop jaundice.  You develop easy bruising or bleeding.  You develop any severe problems as a result of your treatment. MAKE SURE YOU:   Understand these instructions.  Will watch your condition.  Will get help right away if you are not doing well or get worse. Document Released: 04/08/2000 Document Revised: 07/04/2011 Document Reviewed: 08/11/2010 [facility] Patient Information 2014 [place], [place].

## 2013-04-11 NOTE — Progress Notes (Signed)
   Subjective:    Patient ID: Darren Abbott, male    DOB: 06-13-1955, 57 y.o.   MRN: [id number]  HPI This 57 y.o. male presents for evaluation of follow up from recent visit.  He was seen last week For hospital follow up.  He was having a lot of stress and anxiety and was having difficulty. I asked him to follow up so I could go over his recent hospitalization.  He is much calmer and Is coherent.  He was hospitalized in [place] recently due to syncope and collapse and was taken To the ED where he was found to be in atrial fibrillation with RVR.  He was found to have bleeding Esophageal varicies, ETOH abuse, cirrhosis of the liver.  He had the varicies ablated and he Received IV PRBC's and converted to sinus rhythm and then was DC'd from hospital  A few days later.  He has hx of deviated septum and has been scheduled an appointment with ENT which he missed due to being hospitalized.  He is not seeing cardiology.  He has Been to see his local GI and states he refuses to go see him and wants a referral to see A new GI.   Review of Systems C/o anxiety   No chest pain, SOB, HA, dizziness, vision change, N/V, diarrhea, constipation, dysuria, urinary urgency or frequency, myalgias, arthralgias or rash.  Objective:   Physical Exam  Vital signs noted  Well developed well nourished male.  HEENT - Head atraumatic Normocephalic                Eyes - PERRLA, Conjuctiva - clear Sclera- Clear EOMI                Ears - EAC's Wnl TM's Wnl Gross Hearing WNL                Nose - Severly deviated septum                Throat - oropharanx wnl Respiratory - Lungs CTA bilateral Cardiac - RRR S1 and S2 without murmur GI - Abdomen soft Nontender and bowel sounds active x 4 Extremities - No edema. Neuro - Grossly intact.      Assessment & Plan:  Deviated septum - Plan: Ambulatory referral to ENT  Atrial fibrillation - Plan: Ambulatory referral to Cardiology  Cirrhosis - Plan: Ambulatory  referral to Gastroenterology  The patient has been provided a copy of the visit and the referrals made.  I have discussed with Him if he has any questions to follow up.  Deatra Canter FNP

## 2013-04-23 ENCOUNTER — Ambulatory Visit: Payer: Medicaid Other | Admitting: Cardiology

## 2013-04-23 NOTE — Progress Notes (Unsigned)
     Clinical Summary Darren Abbott is a 57 y.o.male   Past Medical History  Diagnosis Date  . Malnutrition of moderate degree   . 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 Altered mental status   . Acute alcoholic hepatitis     possible hepatic encephalopathy May 10, 2011  . Unspecified essential hypertension   . Unspecified epilepsy without mention of intractable epilepsy   . Obstructive sleep apnea (adult) (pediatric)   . Old myocardial infarction   . Anxiety state, unspecified   . Transient ischemic attack (TIA), and cerebral infarction without residual deficits(V12.54)   . Tobacco use disorder   . Body mass index between 19-24, adult   . Atrial fibrillation     episodeat [place].     No Known Allergies   Current Outpatient Prescriptions  Medication Sig Dispense Refill  . ALPRAZolam (XANAX) 0.5 MG tablet Take 1 tablet (0.5 mg total) by mouth at bedtime as needed for sleep.  30 tablet  3  . HYDROcodone-acetaminophen (NORCO/VICODIN) 5-325 MG per tablet Take one-two tabs po q 4-6 hrs prn pain  60 tablet  0  . metoprolol (LOPRESSOR) 50 MG tablet Take 75mg  (1 1/2 tabs) twice a day  30 tablet  11  . Multiple Vitamins-Minerals (CENTRUM SILVER PO) Take 1 tablet by mouth daily.      . pantoprazole (PROTONIX) 40 MG tablet Take 1 tablet (40 mg total) by mouth daily.  30 tablet  3  . sertraline (ZOLOFT) 50 MG tablet Take 1 tablet (50 mg total) by mouth daily.  30 tablet  3   No current facility-administered medications for this visit.     Past Surgical History  Procedure Laterality Date  . Left shoulder surgery       No Known Allergies    No family history on file.   Social History Darren Abbott reports that he has been smoking Cigarettes.  He has a 44 pack-year smoking history. He has never used smokeless tobacco. Darren Abbott reports that he drinks alcohol.   Review of Systems CONSTITUTIONAL: No weight loss, fever, chills, weakness or fatigue.  HEENT:  Eyes: No visual loss, blurred vision, double vision or yellow sclerae.No hearing loss, sneezing, congestion, runny nose or sore throat.  SKIN: No rash or itching.  CARDIOVASCULAR:  RESPIRATORY: No shortness of breath, cough or sputum.  GASTROINTESTINAL: No anorexia, nausea, vomiting or diarrhea. No abdominal pain or blood.  GENITOURINARY: No burning on urination, no polyuria NEUROLOGICAL: No headache, dizziness, syncope, paralysis, ataxia, numbness or tingling in the extremities. No change in bowel or bladder control.  MUSCULOSKELETAL: No muscle, back pain, joint pain or stiffness.  LYMPHATICS: No enlarged nodes. No history of splenectomy.  PSYCHIATRIC: No history of depression or anxiety.  ENDOCRINOLOGIC: No reports of sweating, cold or heat intolerance. No polyuria or polydipsia.  Marland Kitchen   Physical Examination There were no vitals filed for this visit. There were no vitals filed for this visit.  Gen: resting comfortably, no acute distress HEENT: no scleral icterus, pupils equal round and reactive, no palptable cervical adenopathy,  CV Resp: Clear to auscultation bilaterally GI: abdomen is soft, non-tender, non-distended, normal bowel sounds, no hepatosplenomegaly MSK: extremities are warm, no edema.  Skin: warm, no rash Neuro:  no focal deficits Psych: appropriate affect   Diagnostic Studies     Assessment and Plan        Antoine Poche, M.D., F.A.C.C.

## 2013-04-24 ENCOUNTER — Ambulatory Visit (INDEPENDENT_AMBULATORY_CARE_PROVIDER_SITE_OTHER): Payer: Medicaid Other | Admitting: Cardiology

## 2013-04-24 VITALS — BP 150/88 | HR 59 | Ht 68.0 in | Wt 167.8 lb

## 2013-04-24 DIAGNOSIS — I4891 Unspecified atrial fibrillation: Secondary | ICD-10-CM

## 2013-04-24 DIAGNOSIS — M549 Dorsalgia, unspecified: Secondary | ICD-10-CM

## 2013-04-24 MED ORDER — METOPROLOL TARTRATE 50 MG PO TABS: 50.0000 mg | ORAL_TABLET | Freq: Two times a day (BID) | ORAL | Status: DC

## 2013-04-24 NOTE — Patient Instructions (Signed)
Your physician recommends that you schedule a follow-up appointment in: 6 months with Dr. Wyline Mood. You should receive a letter in the mail in 4 months. If you do not receive this letter by May 2015 call our office to schedule this appointment.   Your physician has recommended you make the following change in your medication:  Decrease Metoprolol to 50 MG twice daily  Continue all other medications the same.

## 2013-04-24 NOTE — Progress Notes (Addendum)
Clinical Summary Darren Abbott is a 57 y.o.male former patient of Dr Darren Abbott, this is our first visit together. He was seen for the following medical problems.  1. Afib - not on coumadin due to history of falls, altered mental status, and liver disease.  - recent admission to [facility] with syncope and collapse, found to be in afib with RVR, also noted to have bleeding esophageal varices and cirrhosis of the liver. He was transfused and the varices were ablated.  - out of meds for 2.5 weeks, reports problems with his insurance - describes some occasoinal palpitations, no significant recent episodes despite being off metoprolol   Past Medical History  Diagnosis Date  . Malnutrition of moderate degree   . 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 Altered mental status   . Acute alcoholic hepatitis     possible hepatic encephalopathy May 10, 2011  . Unspecified essential hypertension   . Unspecified epilepsy without mention of intractable epilepsy   . Obstructive sleep apnea (adult) (pediatric)   . Old myocardial infarction   . Anxiety state, unspecified   . Transient ischemic attack (TIA), and cerebral infarction without residual deficits(V12.54)   . Tobacco use disorder   . Body mass index between 19-24, adult   . [place]rial [place]     [place]     No Known Allergies   Current Outpatient Prescriptions  Medication Sig Dispense Refill  . ALPRAZolam (XANAX) 0.5 MG tablet Take 1 tablet (0.5 mg total) by mouth at bedtime as needed for sleep.  30 tablet  3  . HYDROcodone-acetaminophen (NORCO/VICODIN) 5-325 MG per tablet Take one-two tabs po q 4-6 hrs prn pain  60 tablet  0  . metoprolol (LOPRESSOR) 50 MG tablet Take 75mg  (1 1/2 tabs) twice a day  30 tablet  11  . Multiple Vitamins-Minerals (CENTRUM SILVER PO) Take 1 tablet by mouth daily.      . pantoprazole (PROTONIX) 40 MG tablet Take 1 tablet (40 mg total) by mouth daily.  30 tablet  3  . sertraline  (ZOLOFT) 50 MG tablet Take 1 tablet (50 mg total) by mouth daily.  30 tablet  3   No current facility-administered medications for this visit.     Past Surgical History  Procedure Laterality Date  . Left shoulder surgery       No Known Allergies    No family history on file.   Social History Darren Abbott reports that he has been smoking Cigarettes.  He has a 44 pack-year smoking history. He has never used smokeless tobacco. Darren Abbott reports that he drinks alcohol.   Review of Systems CONSTITUTIONAL: No weight loss, fever, chills, weakness or fatigue.  HEENT: Eyes: No visual loss, blurred vision, double vision or yellow sclerae.No hearing loss, sneezing, congestion, runny nose or sore throat.  SKIN: No rash or itching.  CARDIOVASCULAR: per HPI RESPIRATORY: No shortness of breath, cough or sputum.  GASTROINTESTINAL: No anorexia, nausea, vomiting or diarrhea. No abdominal pain or blood.  GENITOURINARY: No burning on urination, no polyuria NEUROLOGICAL: No headache, dizziness, syncope, paralysis, ataxia, numbness or tingling in the extremities. No change in bowel or bladder control.  MUSCULOSKELETAL: No muscle, back pain, joint pain or stiffness.  LYMPHATICS: No enlarged nodes. No history of splenectomy.  PSYCHIATRIC: No history of depression or anxiety.  ENDOCRINOLOGIC: No reports of sweating, cold or heat intolerance. No polyuria or polydipsia.  Marland Kitchen   Physical Examination p 59 bp 150/88 Wt  167 lbs BMI 26 Gen: resting comfortably, no acute distress HEENT: +scleral icterus  CV: RRR, no m/r/g, no JVD Resp: Clear to auscultation bilaterally GI: abdomen is soft, non-tender, non-distended, normal bowel sounds, no hepatosplenomegaly MSK: extremities are warm, no edema.  Skin: jaundiced Neuro:  no focal deficits Psych: appropriate affect  04/24/13 Clinic EKG Sinus rhythm 60  Assessment and Plan  1. Afib - no current symptoms despite being off metoprolol for 2 weeks -  rewrote a Rx for metoprolol, patient notified can be picked up at [facility] for $4. He is not sure he will be able to get it, but will take the prescription - he is a poor anticoagulation candidate due to frequent falls, EtOH abuse, liver cirrhosis, and recent GI variceal bleeding.    Follow up 6 months      Darren Abbott, M.D., F.A.C.C.

## 2013-05-06 ENCOUNTER — Other Ambulatory Visit: Payer: Self-pay | Admitting: Family Medicine

## 2013-05-06 ENCOUNTER — Telehealth: Payer: Self-pay | Admitting: Family Medicine

## 2013-05-06 DIAGNOSIS — M25512 Pain in left shoulder: Secondary | ICD-10-CM

## 2013-05-06 DIAGNOSIS — F329 Major depressive disorder, single episode, unspecified: Secondary | ICD-10-CM

## 2013-05-06 DIAGNOSIS — M549 Dorsalgia, unspecified: Secondary | ICD-10-CM

## 2013-05-06 DIAGNOSIS — F32A Depression, unspecified: Secondary | ICD-10-CM

## 2013-05-06 DIAGNOSIS — F411 Generalized anxiety disorder: Secondary | ICD-10-CM

## 2013-05-06 MED ORDER — ALPRAZOLAM 0.5 MG PO TABS: 0.5000 mg | ORAL_TABLET | Freq: Every evening | ORAL | Status: DC | PRN

## 2013-05-06 MED ORDER — HYDROCODONE-ACETAMINOPHEN 5-325 MG PO TABS: ORAL_TABLET | ORAL | Status: DC

## 2013-05-13 ENCOUNTER — Ambulatory Visit: Payer: Medicaid Other | Admitting: Family Medicine

## 2013-06-12 ENCOUNTER — Ambulatory Visit (INDEPENDENT_AMBULATORY_CARE_PROVIDER_SITE_OTHER): Payer: Medicaid Other | Admitting: Family Medicine

## 2013-06-12 ENCOUNTER — Encounter: Payer: Self-pay | Admitting: Family Medicine

## 2013-06-12 VITALS — BP 143/84 | HR 83 | Temp 97.2°F | Ht 67.0 in | Wt 164.0 lb

## 2013-06-12 DIAGNOSIS — M549 Dorsalgia, unspecified: Secondary | ICD-10-CM

## 2013-06-12 DIAGNOSIS — F329 Major depressive disorder, single episode, unspecified: Secondary | ICD-10-CM

## 2013-06-12 DIAGNOSIS — M25519 Pain in unspecified shoulder: Secondary | ICD-10-CM

## 2013-06-12 DIAGNOSIS — F32A Depression, unspecified: Secondary | ICD-10-CM

## 2013-06-12 DIAGNOSIS — F3289 Other specified depressive episodes: Secondary | ICD-10-CM

## 2013-06-12 DIAGNOSIS — F411 Generalized anxiety disorder: Secondary | ICD-10-CM

## 2013-06-12 DIAGNOSIS — K219 Gastro-esophageal reflux disease without esophagitis: Secondary | ICD-10-CM

## 2013-06-12 DIAGNOSIS — M25512 Pain in left shoulder: Secondary | ICD-10-CM

## 2013-06-12 MED ORDER — PANTOPRAZOLE SODIUM 40 MG PO TBEC: 40.0000 mg | DELAYED_RELEASE_TABLET | Freq: Every day | ORAL | Status: DC

## 2013-06-12 MED ORDER — SERTRALINE HCL 50 MG PO TABS: 50.0000 mg | ORAL_TABLET | Freq: Every day | ORAL | Status: DC

## 2013-06-12 MED ORDER — ALPRAZOLAM 0.5 MG PO TABS: 0.5000 mg | ORAL_TABLET | Freq: Every evening | ORAL | Status: DC | PRN

## 2013-06-12 MED ORDER — METOPROLOL TARTRATE 50 MG PO TABS: 50.0000 mg | ORAL_TABLET | Freq: Two times a day (BID) | ORAL | Status: DC

## 2013-06-12 MED ORDER — HYDROCODONE-ACETAMINOPHEN 5-325 MG PO TABS: ORAL_TABLET | ORAL | Status: DC
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
## 2013-06-18 ENCOUNTER — Telehealth: Payer: Self-pay | Admitting: Family Medicine

## 2013-06-19 ENCOUNTER — Other Ambulatory Visit: Payer: Self-pay | Admitting: Family Medicine

## 2013-06-19 ENCOUNTER — Telehealth: Payer: Self-pay

## 2013-06-19 DIAGNOSIS — J342 Deviated nasal septum: Secondary | ICD-10-CM

## 2013-06-19 NOTE — Telephone Encounter (Signed)
Pt wants an appointment for an ENT referral

## 2013-07-01 ENCOUNTER — Telehealth: Payer: Self-pay | Admitting: Family Medicine

## 2013-07-10 ENCOUNTER — Ambulatory Visit: Payer: Medicaid Other | Admitting: Family Medicine

## 2013-07-17 ENCOUNTER — Telehealth: Payer: Self-pay | Admitting: Cardiology

## 2013-07-17 NOTE — Telephone Encounter (Signed)
Fax[facility] ov [facility] Dr. Pollyann Kennedyosen informing that pt is not on blood thinner.

## 2013-07-19 ENCOUNTER — Telehealth: Payer: Self-pay | Admitting: Cardiology

## 2013-07-23 ENCOUNTER — Telehealth: Payer: Self-pay | Admitting: Cardiology

## 2013-07-23 NOTE — Telephone Encounter (Signed)
I have attempted to call pt at both listed numbers. No answer at both numbers.    [facility] and left a VM for Jasmine DecemberSharon informing her that pt needed to be seen before cardiac clearance is given to pt. Asked that she call me back and if she had another number for pt.

## 2013-07-23 NOTE — Telephone Encounter (Signed)
LM on [phone] for pt to return call.

## 2013-07-24 NOTE — Telephone Encounter (Signed)
Patient informed. 

## 2013-07-24 NOTE — Telephone Encounter (Signed)
Patient returned call

## 2013-07-30 ENCOUNTER — Encounter: Payer: Self-pay | Admitting: Cardiology

## 2013-07-30 ENCOUNTER — Encounter: Payer: Medicaid Other | Admitting: Cardiology

## 2013-07-30 NOTE — Progress Notes (Signed)
    ERROR. No show 

## 2013-08-06 ENCOUNTER — Ambulatory Visit (INDEPENDENT_AMBULATORY_CARE_PROVIDER_SITE_OTHER): Payer: Medicaid Other | Admitting: Cardiology

## 2013-08-06 ENCOUNTER — Encounter: Payer: Self-pay | Admitting: Cardiology

## 2013-08-06 VITALS — BP 121/81 | HR 73 | Ht 73.0 in | Wt 162.1 lb

## 2013-08-06 DIAGNOSIS — Z01810 Encounter for preprocedural cardiovascular examination: Secondary | ICD-10-CM

## 2013-08-06 DIAGNOSIS — I4891 Unspecified atrial fibrillation: Secondary | ICD-10-CM

## 2013-08-06 NOTE — Progress Notes (Signed)
Clinical Summary Darren Abbott is a 58 y.o.male seen today for follow up of the following medical problems.  .  1. Afib  - not on coumadin due to history of falls, altered mental status, liver disease with varices.  - [facility], found to be in afib with RVR, also noted to have bleeding esophageal varices and cirrhosis of the liver. He was transfused and the varices were ablated.  - denies significant palpitations recently, compliant with metoprolol.   2. Preoperative evaluation - being considered for ENT surgery under general anesthesia.  - walks up a flight of stairs at home daily on a regular basis without limitation. Does heavy yard work like clearing trees and dragging them for pick up without symptoms.  - denies any chest pain, SOB. Can get occas LE edema.   Past Medical History  Diagnosis Date  . Malnutrition of moderate degree   . 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 Altered mental status   . Acute alcoholic hepatitis     possible hepatic encephalopathy May 10, 2011  . Unspecified essential hypertension   . Unspecified epilepsy without mention of intractable epilepsy   . Obstructive sleep apnea (adult) (pediatric)   . Old myocardial infarction   . Anxiety state, unspecified   . Transient ischemic attack (TIA), and cerebral infarction without residual deficits(V12.54)   . Tobacco use disorder   . Body mass index between 19-24, adult   . [place]     [place].     No Known Allergies   Current Outpatient Prescriptions  Medication Sig Dispense Refill  . ALPRAZolam (XANAX) 0.5 MG tablet Take 1 tablet (0.5 mg total) by mouth at bedtime as needed for sleep.  30 tablet  3  . HYDROcodone-acetaminophen (NORCO/VICODIN) 5-325 MG per tablet Take one-two tabs po q 4-6 hrs prn pain  60 tablet  0  . metoprolol (LOPRESSOR) 50 MG tablet Take 1 tablet (50 mg total) by mouth 2 (two) times daily.  60  tablet  11  . Multiple Vitamins-Minerals (CENTRUM SILVER PO) Take 1 tablet by mouth daily.      . pantoprazole (PROTONIX) 40 MG tablet Take 1 tablet (40 mg total) by mouth daily.  30 tablet  3  . sertraline (ZOLOFT) 50 MG tablet Take 1 tablet (50 mg total) by mouth daily.  30 tablet  3   No current facility-administered medications for this visit.     Past Surgical History  Procedure Laterality Date  . Left shoulder surgery       No Known Allergies    No family history on file.   Social History Darren Abbott reports that he has been smoking Cigarettes.  He has a 44 pack-year smoking history. He has never used smokeless tobacco. Darren Abbott reports that he drinks alcohol.   Review of Systems CONSTITUTIONAL: No weight loss, fever, chills, weakness or fatigue.  HEENT: Eyes: No visual loss, blurred vision, double vision or yellow sclerae.No hearing loss, sneezing, congestion, runny nose or sore throat.  SKIN: No rash or itching.  CARDIOVASCULAR: per HPI RESPIRATORY: No shortness of breath, cough or sputum.  GASTROINTESTINAL: No anorexia, nausea, vomiting or diarrhea. No abdominal pain or blood.  GENITOURINARY: No burning on urination, no polyuria NEUROLOGICAL: No headache, dizziness, syncope, paralysis, ataxia, numbness or tingling in the extremities. No change in bowel or bladder control.  MUSCULOSKELETAL: No muscle, back pain, joint pain or stiffness.  LYMPHATICS:  No enlarged nodes. No history of splenectomy.  PSYCHIATRIC: No history of depression or anxiety.  ENDOCRINOLOGIC: No reports of sweating, cold or heat intolerance. No polyuria or polydipsia.  Marland Kitchen.   Physical Examination p 73 bp 121/81 Wt 162 lbs BMI 21 Gen: resting comfortably, no acute distress HEENT: no scleral icterus, pupils equal round and reactive, no palptable cervical adenopathy,  CV: RRR, no m/r/g, no JVD, no carotid bruits Resp: Clear to auscultation bilaterally GI: abdomen is soft, non-tender, non-distended,  normal bowel sounds, no hepatosplenomegaly MSK: extremities are warm, no edema.  Skin: warm, no rash Neuro:  no focal deficits Psych: appropriate affect     Assessment and Plan  1. Afib - rate controlled with metoprolol, no current symptoms - not on anticoag due to hx of liver disease with varices and prior bleeding, he reports he was also told previously not to take ASA  2. Preoperative evaluation - he is being considered for an intermediate risk ENT procedure under general anesthesia. He has no active acute cardiac conditions. He tolerates >4METs on a daily basis without any limitations. Recommend proceeding with surgery as planned without any further cardiac testing or interventions. Continue beta blocker in the perioperative period.       Antoine PocheJonathan F. Zahria Ding, M.D., F.A.C.C.

## 2013-08-06 NOTE — Patient Instructions (Signed)
Your physician recommends that you schedule a follow-up appointment in: 1 year with Dr. Branch. You should receive a letter in the mail in 10 months. If you do not receive this letter by February 2016 call our office to schedule this appointment.   Your physician recommends that you continue on your current medications as directed. Please refer to the Current Medication list given to you today.   
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
## 2013-08-23 ENCOUNTER — Encounter (HOSPITAL_COMMUNITY)
Admission: RE | Admit: 2013-08-23 | Discharge: 2013-08-23 | Disposition: A | Discharge status: Home / Self Care | Payer: Medicaid Other | Source: Ambulatory Visit | Attending: Otolaryngology | Admitting: Otolaryngology

## 2013-08-23 ENCOUNTER — Encounter (HOSPITAL_COMMUNITY)
Admission: RE | Admit: 2013-08-23 | Discharge: 2013-08-23 | Disposition: A | Discharge status: Home / Self Care | Payer: Medicaid Other | Source: Ambulatory Visit | Attending: Anesthesiology | Admitting: Anesthesiology

## 2013-08-23 ENCOUNTER — Encounter (HOSPITAL_COMMUNITY): Payer: Self-pay

## 2013-08-23 DIAGNOSIS — Z01818 Encounter for other preprocedural examination: Secondary | ICD-10-CM | POA: Insufficient documentation

## 2013-08-23 DIAGNOSIS — Z01812 Encounter for preprocedural laboratory examination: Secondary | ICD-10-CM | POA: Insufficient documentation

## 2013-08-23 DIAGNOSIS — Z01810 Encounter for preprocedural cardiovascular examination: Secondary | ICD-10-CM | POA: Insufficient documentation

## 2013-08-23 DIAGNOSIS — I10 Essential (primary) hypertension: Secondary | ICD-10-CM | POA: Insufficient documentation

## 2013-08-23 HISTORY — DX: Cardiac murmur, unspecified: R01.1

## 2013-08-23 HISTORY — DX: Laceration without foreign body of unspecified hand, initial encounter: S61.419A

## 2013-08-23 HISTORY — DX: Gastro-esophageal reflux disease without esophagitis: K21.9

## 2013-08-23 HISTORY — DX: Unspecified osteoarthritis, unspecified site: M19.90

## 2013-08-23 LAB — COMPREHENSIVE METABOLIC PANEL
ALT: 33 U/L (ref 0–53)
AST: 46 U/L — ABNORMAL HIGH (ref 0–37)
Albumin: 2.8 g/dL — ABNORMAL LOW (ref 3.5–5.2)
Alkaline Phosphatase: 126 U/L — ABNORMAL HIGH (ref 39–117)
BUN: 7 mg/dL (ref 6–23)
CHLORIDE: 108 meq/L (ref 96–112)
CO2: 27 mEq/L (ref 19–32)
Calcium: 8.8 mg/dL (ref 8.4–10.5)
Creatinine, Ser: 0.71 mg/dL (ref 0.50–1.35)
GFR calc Af Amer: >90 mL/min (ref >90)
GFR calc non Af Amer: >90 mL/min (ref >90)
Glucose, Bld: 73 mg/dL (ref 70–99)
POTASSIUM: 4.4 meq/L (ref 3.7–5.3)
SODIUM: 144 meq/L (ref 137–147)
TOTAL PROTEIN: 6.6 g/dL (ref 6.0–8.3)
Total Bilirubin: 2.5 mg/dL — ABNORMAL HIGH (ref 0.3–1.2)

## 2013-08-23 LAB — CBC
HCT: 42.9 % (ref 39.0–52.0)
HEMOGLOBIN: 14.7 g/dL (ref 13.0–17.0)
MCH: 30.4 pg (ref 26.0–34.0)
MCHC: 34.3 g/dL (ref 30.0–36.0)
MCV: 88.8 fL (ref 78.0–100.0)
PLATELETS: 72 10*3/uL — AB (ref 150–400)
RBC: 4.83 MIL/uL (ref 4.22–5.81)
RDW: 14.1 % (ref 11.5–15.5)
WBC: 5.9 10*3/uL (ref 4.0–10.5)

## 2013-08-23 LAB — PROTIME-INR
INR: 1.27 (ref 0.00–1.49)
Prothrombin Time: 15.6 seconds — ABNORMAL HIGH (ref 11.6–15.2)

## 2013-08-23 LAB — APTT: aPTT: 36 seconds (ref 24–37)

## 2013-08-23 NOTE — Pre-Procedure Instructions (Addendum)
Darren SchilderHerbert Abbott  08/23/2013   Your procedure is scheduled on:  08/30/13  Report to [facility] short stay admitting at 700 AM.  Call this number if you have problems the morning of surgery: [phone]   Remember:   Do not eat food or drink liquids after midnight.   Take these medicines the morning of surgery with A SIP OF WATER: pain med, xanax if needed,  Metoprolol, protonix, zoloft        Take all meds as ordered until day of surgery except as instructed below or per dr            Despina AriasSTOP all herbel meds, nsaids (aleve,naproxen,advil,ibuprofen) 5 days prior to surgery (08/25/13) including vitamins ,aspirin   Do not wear jewelry, make-up or nail polish.  Do not wear lotions, powders, or perfumes. You may wear deodorant.  Do not shave 48 hours prior to surgery. Men may shave face and neck.  Do not bring valuables to the hospital.  [facility] not responsible                  for any belongings or valuables.               Contacts, dentures or bridgework may not be worn into surgery.  Leave suitcase in the car. After surgery it may be brought to your room.  For patients admitted to the hospital, discharge time is determined by your                treatment team.               Patients discharged the day of surgery will not be allowed to drive  home.  Name and phone number of your driver:   Special Instructions:  Special Instructions: [facility] - Preparing for Surgery  Before surgery, you can play an important role.  Because skin is not sterile, your skin needs to be as free of germs as possible.  You can reduce the number of germs on you skin by washing with CHG (chlorahexidine gluconate) soap before surgery.  CHG is an antiseptic cleaner which kills germs and bonds with the skin to continue killing germs even after washing.  Please DO NOT use if you have an allergy to CHG or antibacterial soaps.  If your skin becomes reddened/irritated stop using the CHG and inform your nurse when you  arrive at Short Stay.  Do not shave (including legs and underarms) for at least 48 hours prior to the first CHG shower.  You may shave your face.  Please follow these instructions carefully:   1.  Shower with CHG Soap the night before surgery and the morning of Surgery.  2.  If you choose to wash your hair, wash your hair first as usual with your normal shampoo.  3.  After you shampoo, rinse your hair and body thoroughly to remove the Shampoo.  4.  Use CHG as you would any other liquid soap.  You can apply chg directly  to the skin and wash gently with scrungie or a clean washcloth.  5.  Apply the CHG Soap to your body ONLY FROM THE NECK DOWN.  Do not use on open wounds or open sores.  Avoid contact with your eyes ears, mouth and genitals (private parts).  Wash genitals (private parts)       with your normal soap.  6.  Wash thoroughly, paying special attention to the area where your surgery will be  performed.  7.  Thoroughly rinse your body with warm water from the neck down.  8.  DO NOT shower/wash with your normal soap after using and rinsing off the CHG Soap.  9.  Pat yourself dry with a clean towel.            10.  Wear clean pajamas.            11.  Place clean sheets on your bed the night of your first shower and do not sleep with pets.  Day of Surgery  Do not apply any lotions/deodorants the morning of surgery.  Please wear clean clothes to the hospital/surgery center.   Please read over the following fact sheets that you were given: Pain Booklet, Coughing and Deep Breathing and Surgical Site Infection Prevention
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
## 2013-08-29 MED ORDER — CEFAZOLIN SODIUM-DEXTROSE 2-3 GM-% IV SOLR
2.0000 g | INTRAVENOUS | Status: AC
  Administered 2013-08-30: 2 g via INTRAVENOUS
  Filled 2013-08-29: qty 50

## 2013-08-29 MED ORDER — OXYMETAZOLINE HCL 0.05 % NA SOLN
2.0000 | NASAL | Status: AC
  Administered 2013-08-30 (×3): 2 via NASAL
  Filled 2013-08-29 (×2): qty 15

## 2013-08-30 ENCOUNTER — Encounter (HOSPITAL_COMMUNITY): Payer: Self-pay | Admitting: Surgery

## 2013-08-30 ENCOUNTER — Encounter (HOSPITAL_COMMUNITY): Payer: Medicaid Other | Admitting: Vascular Surgery

## 2013-08-30 ENCOUNTER — Observation Stay (HOSPITAL_COMMUNITY)
Admission: RE | Admit: 2013-08-30 | Discharge: 2013-08-31 | Disposition: A | Discharge status: Home / Self Care | Payer: Medicaid Other | Source: Ambulatory Visit | Attending: Otolaryngology | Admitting: Otolaryngology

## 2013-08-30 ENCOUNTER — Ambulatory Visit (HOSPITAL_COMMUNITY): Payer: Medicaid Other | Admitting: Anesthesiology

## 2013-08-30 ENCOUNTER — Encounter (HOSPITAL_COMMUNITY)
Admission: RE | Disposition: A | Discharge status: Home / Self Care | Payer: Self-pay | Source: Ambulatory Visit | Attending: Otolaryngology

## 2013-08-30 DIAGNOSIS — J4489 Other specified chronic obstructive pulmonary disease: Secondary | ICD-10-CM | POA: Insufficient documentation

## 2013-08-30 DIAGNOSIS — F17200 Nicotine dependence, unspecified, uncomplicated: Secondary | ICD-10-CM | POA: Insufficient documentation

## 2013-08-30 DIAGNOSIS — I252 Old myocardial infarction: Secondary | ICD-10-CM | POA: Insufficient documentation

## 2013-08-30 DIAGNOSIS — I10 Essential (primary) hypertension: Secondary | ICD-10-CM | POA: Insufficient documentation

## 2013-08-30 DIAGNOSIS — J342 Deviated nasal septum: Secondary | ICD-10-CM | POA: Diagnosis present

## 2013-08-30 DIAGNOSIS — S0292XS Unspecified fracture of facial bones, sequela: Secondary | ICD-10-CM

## 2013-08-30 DIAGNOSIS — G43909 Migraine, unspecified, not intractable, without status migrainosus: Secondary | ICD-10-CM | POA: Insufficient documentation

## 2013-08-30 DIAGNOSIS — I2510 Atherosclerotic heart disease of native coronary artery without angina pectoris: Secondary | ICD-10-CM | POA: Insufficient documentation

## 2013-08-30 DIAGNOSIS — Z8673 Personal history of transient ischemic attack (TIA), and cerebral infarction without residual deficits: Secondary | ICD-10-CM | POA: Insufficient documentation

## 2013-08-30 DIAGNOSIS — M950 Acquired deformity of nose: Principal | ICD-10-CM | POA: Insufficient documentation

## 2013-08-30 DIAGNOSIS — H9190 Unspecified hearing loss, unspecified ear: Secondary | ICD-10-CM | POA: Insufficient documentation

## 2013-08-30 DIAGNOSIS — K219 Gastro-esophageal reflux disease without esophagitis: Secondary | ICD-10-CM | POA: Insufficient documentation

## 2013-08-30 DIAGNOSIS — S0291XS Unspecified fracture of skull, sequela: Secondary | ICD-10-CM | POA: Insufficient documentation

## 2013-08-30 DIAGNOSIS — R569 Unspecified convulsions: Secondary | ICD-10-CM | POA: Insufficient documentation

## 2013-08-30 DIAGNOSIS — G4733 Obstructive sleep apnea (adult) (pediatric): Secondary | ICD-10-CM | POA: Insufficient documentation

## 2013-08-30 DIAGNOSIS — B1920 Unspecified viral hepatitis C without hepatic coma: Secondary | ICD-10-CM | POA: Insufficient documentation

## 2013-08-30 DIAGNOSIS — F1010 Alcohol abuse, uncomplicated: Secondary | ICD-10-CM | POA: Insufficient documentation

## 2013-08-30 DIAGNOSIS — J449 Chronic obstructive pulmonary disease, unspecified: Secondary | ICD-10-CM | POA: Insufficient documentation

## 2013-08-30 DIAGNOSIS — Z9889 Other specified postprocedural states: Secondary | ICD-10-CM

## 2013-08-30 HISTORY — DX: Migraine, unspecified, not intractable, without status migrainosus: G43.909

## 2013-08-30 HISTORY — DX: Headache: R51

## 2013-08-30 HISTORY — DX: Other chronic pain: G89.29

## 2013-08-30 HISTORY — DX: Low back pain: M54.5

## 2013-08-30 HISTORY — DX: Prediabetes: R73.03

## 2013-08-30 HISTORY — DX: Dependence on supplemental oxygen: Z99.81

## 2013-08-30 HISTORY — DX: Chronic kidney disease, unspecified: N18.9

## 2013-08-30 HISTORY — PX: RHINOPLASTY: SUR1284

## 2013-08-30 HISTORY — DX: Headache, unspecified: R51.9

## 2013-08-30 HISTORY — PX: RHINOPLASTY: SHX2354

## 2013-08-30 HISTORY — PX: SEPTOPLASTY: SHX2393

## 2013-08-30 HISTORY — PX: NASAL SEPTUM SURGERY: SHX37

## 2013-08-30 HISTORY — DX: Cerebral infarction, unspecified: I63.9

## 2013-08-30 HISTORY — DX: Low back pain, unspecified: M54.50

## 2013-08-30 HISTORY — DX: Unspecified asthma, uncomplicated: J45.909

## 2013-08-30 SURGERY — SEPTOPLASTY, NOSE
Anesthesia: General | Site: Nose

## 2013-08-30 MED ORDER — LIDOCAINE-EPINEPHRINE 1 %-1:100000 IJ SOLN
INTRAMUSCULAR | Status: DC | PRN
  Administered 2013-08-30: 20 mL via INTRADERMAL

## 2013-08-30 MED ORDER — HYDROCODONE-ACETAMINOPHEN 7.5-325 MG PO TABS: 1.0000 | ORAL_TABLET | Freq: Four times a day (QID) | ORAL | Status: DC | PRN

## 2013-08-30 MED ORDER — PROPOFOL 10 MG/ML IV BOLUS
INTRAVENOUS | Status: DC | PRN
  Administered 2013-08-30: 160 mg via INTRAVENOUS

## 2013-08-30 MED ORDER — LIDOCAINE HCL (CARDIAC) 20 MG/ML IV SOLN
INTRAVENOUS | Status: DC | PRN
Start: 2013-08-30 — End: 2013-08-30
  Administered 2013-08-30: 40 mg via INTRAVENOUS

## 2013-08-30 MED ORDER — HYDROCODONE-ACETAMINOPHEN 5-325 MG PO TABS
2.0000 | ORAL_TABLET | Freq: Four times a day (QID) | ORAL | Status: DC | PRN
  Administered 2013-08-30 – 2013-08-31 (×2): 2 via ORAL
  Filled 2013-08-30 (×2): qty 2

## 2013-08-30 MED ORDER — ALPRAZOLAM 0.5 MG PO TABS
0.5000 mg | ORAL_TABLET | Freq: Every evening | ORAL | Status: DC | PRN
  Administered 2013-08-30: 0.5 mg via ORAL
  Filled 2013-08-30: qty 1

## 2013-08-30 MED ORDER — GLYCOPYRROLATE 0.2 MG/ML IJ SOLN
INTRAMUSCULAR | Status: AC
  Filled 2013-08-30: qty 1

## 2013-08-30 MED ORDER — PROMETHAZINE HCL 25 MG PO TABS: 25.0000 mg | ORAL_TABLET | Freq: Four times a day (QID) | ORAL | Status: DC | PRN

## 2013-08-30 MED ORDER — NEOSTIGMINE METHYLSULFATE 10 MG/10ML IV SOLN
INTRAVENOUS | Status: DC | PRN
  Administered 2013-08-30: 4 mg via INTRAVENOUS

## 2013-08-30 MED ORDER — HYDROMORPHONE HCL PF 1 MG/ML IJ SOLN
0.2500 mg | INTRAMUSCULAR | Status: DC | PRN
  Administered 2013-08-30: 0.25 mg via INTRAVENOUS
  Administered 2013-08-30: 0.5 mg via INTRAVENOUS

## 2013-08-30 MED ORDER — OXYCODONE HCL 5 MG PO TABS: 5.0000 mg | ORAL_TABLET | Freq: Once | ORAL | Status: DC | PRN

## 2013-08-30 MED ORDER — FENTANYL CITRATE 0.05 MG/ML IJ SOLN
INTRAMUSCULAR | Status: AC
  Filled 2013-08-30: qty 5

## 2013-08-30 MED ORDER — CENTRUM SILVER PO TABS: ORAL_TABLET | Freq: Every day | ORAL | Status: DC

## 2013-08-30 MED ORDER — ONDANSETRON HCL 4 MG/2ML IJ SOLN
INTRAMUSCULAR | Status: AC
  Filled 2013-08-30: qty 2

## 2013-08-30 MED ORDER — SERTRALINE HCL 50 MG PO TABS
50.0000 mg | ORAL_TABLET | Freq: Every day | ORAL | Status: DC
  Administered 2013-08-30 – 2013-08-31 (×2): 50 mg via ORAL
  Filled 2013-08-30 (×2): qty 1

## 2013-08-30 MED ORDER — PROPOFOL 10 MG/ML IV BOLUS
INTRAVENOUS | Status: AC
  Filled 2013-08-30: qty 20

## 2013-08-30 MED ORDER — DEXAMETHASONE SODIUM PHOSPHATE 4 MG/ML IJ SOLN
INTRAMUSCULAR | Status: AC
  Filled 2013-08-30: qty 1

## 2013-08-30 MED ORDER — 0.9 % SODIUM CHLORIDE (POUR BTL) OPTIME
TOPICAL | Status: DC | PRN
  Administered 2013-08-30: 1000 mL

## 2013-08-30 MED ORDER — OXYCODONE HCL 5 MG/5ML PO SOLN: 5.0000 mg | Freq: Once | ORAL | Status: DC | PRN

## 2013-08-30 MED ORDER — METOCLOPRAMIDE HCL 5 MG/ML IJ SOLN: 10.0000 mg | Freq: Once | INTRAMUSCULAR | Status: DC | PRN

## 2013-08-30 MED ORDER — DEXAMETHASONE SODIUM PHOSPHATE 4 MG/ML IJ SOLN
INTRAMUSCULAR | Status: DC | PRN
  Administered 2013-08-30: 4 mg via INTRAVENOUS

## 2013-08-30 MED ORDER — LIDOCAINE HCL (CARDIAC) 20 MG/ML IV SOLN
INTRAVENOUS | Status: AC
  Filled 2013-08-30: qty 5

## 2013-08-30 MED ORDER — BACITRACIN ZINC 500 UNIT/GM EX OINT
TOPICAL_OINTMENT | CUTANEOUS | Status: AC
  Filled 2013-08-30: qty 15

## 2013-08-30 MED ORDER — DEXTROSE-NACL 5-0.9 % IV SOLN
INTRAVENOUS | Status: DC
  Administered 2013-08-30: 14:00:00 via INTRAVENOUS

## 2013-08-30 MED ORDER — ADULT MULTIVITAMIN W/MINERALS CH
1.0000 | ORAL_TABLET | Freq: Every day | ORAL | Status: DC
  Administered 2013-08-30 – 2013-08-31 (×2): 1 via ORAL
  Filled 2013-08-30 (×2): qty 1

## 2013-08-30 MED ORDER — ROCURONIUM BROMIDE 100 MG/10ML IV SOLN
INTRAVENOUS | Status: DC | PRN
  Administered 2013-08-30: 40 mg via INTRAVENOUS

## 2013-08-30 MED ORDER — NEOSTIGMINE METHYLSULFATE 10 MG/10ML IV SOLN
INTRAVENOUS | Status: AC
  Filled 2013-08-30: qty 1

## 2013-08-30 MED ORDER — CEPHALEXIN 250 MG/5ML PO SUSR
500.0000 mg | Freq: Two times a day (BID) | ORAL | Status: DC
  Administered 2013-08-30 – 2013-08-31 (×3): 500 mg via ORAL
  Filled 2013-08-30 (×4): qty 10

## 2013-08-30 MED ORDER — LIDOCAINE-EPINEPHRINE 1 %-1:100000 IJ SOLN
INTRAMUSCULAR | Status: AC
  Filled 2013-08-30: qty 1

## 2013-08-30 MED ORDER — PROMETHAZINE HCL 25 MG RE SUPP: 25.0000 mg | Freq: Four times a day (QID) | RECTAL | Status: DC | PRN

## 2013-08-30 MED ORDER — OXYMETAZOLINE HCL 0.05 % NA SOLN
NASAL | Status: AC
Start: 2013-08-30 — End: 2013-08-30
  Filled 2013-08-30: qty 15

## 2013-08-30 MED ORDER — ROCURONIUM BROMIDE 50 MG/5ML IV SOLN
INTRAVENOUS | Status: AC
  Filled 2013-08-30: qty 1

## 2013-08-30 MED ORDER — HYDROMORPHONE HCL PF 1 MG/ML IJ SOLN
INTRAMUSCULAR | Status: AC
  Filled 2013-08-30: qty 1

## 2013-08-30 MED ORDER — LACTATED RINGERS IV SOLN
INTRAVENOUS | Status: DC | PRN
  Administered 2013-08-30: 08:00:00 via INTRAVENOUS

## 2013-08-30 MED ORDER — OXYMETAZOLINE HCL 0.05 % NA SOLN
NASAL | Status: DC | PRN
  Administered 2013-08-30: 1 via NASAL

## 2013-08-30 MED ORDER — MIDAZOLAM HCL 2 MG/2ML IJ SOLN
INTRAMUSCULAR | Status: AC
  Filled 2013-08-30: qty 2

## 2013-08-30 MED ORDER — PHENYLEPHRINE HCL 10 MG/ML IJ SOLN
10.0000 mg | INTRAVENOUS | Status: DC | PRN
  Administered 2013-08-30: 20 ug/min via INTRAVENOUS

## 2013-08-30 MED ORDER — FENTANYL CITRATE 0.05 MG/ML IJ SOLN
INTRAMUSCULAR | Status: DC | PRN
  Administered 2013-08-30: 50 ug via INTRAVENOUS

## 2013-08-30 MED ORDER — ARTIFICIAL TEARS OP OINT
TOPICAL_OINTMENT | OPHTHALMIC | Status: DC | PRN
Start: 2013-08-30 — End: 2013-08-30
  Administered 2013-08-30: 1 via OPHTHALMIC

## 2013-08-30 MED ORDER — MIDAZOLAM HCL 5 MG/5ML IJ SOLN
INTRAMUSCULAR | Status: DC | PRN
Start: 2013-08-30 — End: 2013-08-30
  Administered 2013-08-30: 1 mg via INTRAVENOUS

## 2013-08-30 MED ORDER — GLYCOPYRROLATE 0.2 MG/ML IJ SOLN
INTRAMUSCULAR | Status: DC | PRN
  Administered 2013-08-30: 0.6 mg via INTRAVENOUS

## 2013-08-30 MED ORDER — GLYCOPYRROLATE 0.2 MG/ML IJ SOLN
INTRAMUSCULAR | Status: AC
  Filled 2013-08-30: qty 2

## 2013-08-30 MED ORDER — METOPROLOL TARTRATE 50 MG PO TABS
50.0000 mg | ORAL_TABLET | Freq: Two times a day (BID) | ORAL | Status: DC
  Administered 2013-08-30 – 2013-08-31 (×2): 50 mg via ORAL
  Filled 2013-08-30 (×3): qty 1

## 2013-08-30 MED ORDER — BACITRACIN ZINC 500 UNIT/GM EX OINT
TOPICAL_OINTMENT | CUTANEOUS | Status: DC | PRN
  Administered 2013-08-30: 1 via TOPICAL

## 2013-08-30 MED ORDER — CEPHALEXIN 500 MG PO CAPS: 500.0000 mg | ORAL_CAPSULE | Freq: Three times a day (TID) | ORAL | Status: DC

## 2013-08-30 MED ORDER — ONDANSETRON HCL 4 MG/2ML IJ SOLN
INTRAMUSCULAR | Status: DC | PRN
  Administered 2013-08-30: 4 mg via INTRAVENOUS

## 2013-08-30 MED ORDER — PANTOPRAZOLE SODIUM 40 MG PO TBEC
40.0000 mg | DELAYED_RELEASE_TABLET | Freq: Every day | ORAL | Status: DC
  Administered 2013-08-30 – 2013-08-31 (×2): 40 mg via ORAL
  Filled 2013-08-30 (×2): qty 1

## 2013-08-30 MED ORDER — PHENYLEPHRINE HCL 10 MG/ML IJ SOLN
INTRAMUSCULAR | Status: DC | PRN
  Administered 2013-08-30 (×2): 80 ug via INTRAVENOUS

## 2013-08-30 MED ORDER — LACTATED RINGERS IV SOLN
INTRAVENOUS | Status: DC
  Administered 2013-08-30: 08:00:00 via INTRAVENOUS

## 2013-08-30 SURGICAL SUPPLY — 34 items
ATTRACTOMAT 16X20 MAGNETIC DRP (DRAPES) IMPLANT
BENZOIN TINCTURE PRP APPL 2/3 (GAUZE/BANDAGES/DRESSINGS) ×3 IMPLANT
CANISTER SUCTION 2500CC (MISCELLANEOUS) ×3 IMPLANT
CLOSURE WOUND 1/2 X4 (GAUZE/BANDAGES/DRESSINGS) ×1
COAGULATOR SUCT 6 FR SWTCH (ELECTROSURGICAL) ×1
COAGULATOR SUCT SWTCH 10FR 6 (ELECTROSURGICAL) ×2 IMPLANT
CRADLE DONUT ADULT HEAD (MISCELLANEOUS) ×3 IMPLANT
DRESSING TELFA 8X3 (GAUZE/BANDAGES/DRESSINGS) ×3 IMPLANT
DRSG TELFA 3X8 NADH (GAUZE/BANDAGES/DRESSINGS) ×3 IMPLANT
ELECT REM PT RETURN 9FT ADLT (ELECTROSURGICAL) ×3
ELECTRODE REM PT RTRN 9FT ADLT (ELECTROSURGICAL) ×1 IMPLANT
GAUZE SPONGE 2X2 8PLY STRL LF (GAUZE/BANDAGES/DRESSINGS) ×1 IMPLANT
GLOVE BIOGEL PI IND STRL 6.5 (GLOVE) ×1 IMPLANT
GLOVE BIOGEL PI INDICATOR 6.5 (GLOVE) ×2
GLOVE ECLIPSE 7.5 STRL STRAW (GLOVE) ×3 IMPLANT
GLOVE SURG SS PI 6.5 STRL IVOR (GLOVE) ×3 IMPLANT
GOWN STRL REUS W/ TWL LRG LVL3 (GOWN DISPOSABLE) ×2 IMPLANT
GOWN STRL REUS W/TWL LRG LVL3 (GOWN DISPOSABLE) ×4
KIT BASIN OR (CUSTOM PROCEDURE TRAY) ×3 IMPLANT
KIT ROOM TURNOVER OR (KITS) ×3 IMPLANT
NEEDLE 27GAX1X1/2 (NEEDLE) ×3 IMPLANT
NS IRRIG 1000ML POUR BTL (IV SOLUTION) ×3 IMPLANT
PAD ARMBOARD 7.5X6 YLW CONV (MISCELLANEOUS) ×6 IMPLANT
PATTIES SURGICAL .5 X3 (DISPOSABLE) ×6 IMPLANT
SPLINT NASAL THERMO PLAST (MISCELLANEOUS) ×3 IMPLANT
SPONGE GAUZE 2X2 STER 10/PKG (GAUZE/BANDAGES/DRESSINGS) ×2
STRIP CLOSURE SKIN 1/2X4 (GAUZE/BANDAGES/DRESSINGS) ×2 IMPLANT
SUT CHROMIC 3 0 PS 2 (SUTURE) ×3 IMPLANT
SUT ETHILON 3 0 PS 1 (SUTURE) IMPLANT
SUT PLAIN 4 0 ~~LOC~~ 1 (SUTURE) ×3 IMPLANT
TOWEL OR 17X24 6PK STRL BLUE (TOWEL DISPOSABLE) ×3 IMPLANT
TOWEL OR 17X26 10 PK STRL BLUE (TOWEL DISPOSABLE) ×3 IMPLANT
TRAY ENT MC OR (CUSTOM PROCEDURE TRAY) ×3 IMPLANT
WATER STERILE IRR 1000ML POUR (IV SOLUTION) ×3 IMPLANT

## 2013-08-30 NOTE — Op Note (Signed)
OPERATIVE REPORT  DATE OF SURGERY: 08/30/2013  PATIENT:  Darren Abbott,  58 y.o. male  PRE-OPERATIVE DIAGNOSIS:  Deviated septum  POST-OPERATIVE DIAGNOSIS:  Deviated septum  PROCEDURE:  Procedure(s): SEPTOPLASTY RHINOPLASTY  SURGEON:  Susy FrizzleJefry H Tijuan Dantes, MD  ASSISTANTS: none   ANESTHESIA:   General   EBL:  200 ml  DRAINS: None   LOCAL MEDICATIONS USED:  1% Xylocaine with epinephrine  SPECIMEN:  none  COUNTS:  Correct  PROCEDURE DETAILS: The patient was taken to the operating room and placed on the operating table in the supine position. Following induction of general endotracheal anesthesia the nose was prepped and draped in a standard fashion. Afrin spray was used preoperatively. 1% Xylocaine with epinephrine was infiltrated into the septum the columella in the intercartilaginous area as well as the lateral piriform aperture bilaterally. Afrin pledges were placed bilaterally. A left hemitransfixion incision is used to approach the septal cartilage. Mucoperichondrial flap was developed posteriorly down the left side. There is a severe fracture and flexion of the septum to the right side. The bony cartilaginous junction was divided and the flap was developed down the right side posteriorly. The ethmoid plate was resected. Portion of the vomer and quadrangular cartilage posteriorly were also resected. A piece of the right maxillary crest was taken off with a Takahashi forceps. An intercartilaginous incision was created on the left side, as well as bilateral perform aperture incisions. A straight guarded osteotome was used to create a medial osteotomy. Bilateral lateral curved osteotomies were then performed. The entire nasal complex was freed up and was reduced. A severe nasal dorsal deflection was then brought back to midline position. The quadrangle cartilage followed the nasal bones there some residual curvature toward the right. Airways were dramatically improved bilaterally. No further  action was taken on the cartilage for fear of risking collapse. The mucosal incisions were reapproximated with interrupted 3-0 chromic suture. 4 plain gut was used to quilt the septal flaps. Nasal cavities were packed with rolled up Telfa coated with bacitracin ointment. The nasal dorsum was dressed with benzoin Steri-Strips and Thermoplast splint. The pharynx was suctioned of blood and secretions. Oral gastric tube was used to aspirate the contents of the stomach.    PATIENT DISPOSITION:  To PACU, stable

## 2013-08-30 NOTE — Telephone Encounter (Signed)
done

## 2013-08-30 NOTE — Anesthesia Preprocedure Evaluation (Signed)
Anesthesia Evaluation  Patient identified by MRN, date of birth, ID band Patient awake    Reviewed: Allergy & Precautions, H&P , NPO status , Patient's Chart, lab work & pertinent test results, reviewed documented beta blocker date and time   Airway Mallampati: II TM Distance: >3 FB Neck ROM: full    Dental   Pulmonary sleep apnea , COPDCurrent Smoker,  breath sounds clear to auscultation        Cardiovascular hypertension, On Medications and On Home Beta Blockers + CAD and + Past MI + Valvular Problems/Murmurs Rhythm:regular     Neuro/Psych Seizures -,  PSYCHIATRIC DISORDERS negative psych ROS   GI/Hepatic GERD-  Medicated,(+)   Esophageal Varices  substance abuse  alcohol use, Hepatitis -, C  Endo/Other  negative endocrine ROS  Renal/GU negative Renal ROS  negative genitourinary   Musculoskeletal   Abdominal   Peds  Hematology negative hematology ROS (+)   Anesthesia Other Findings See surgeon's H&P   Reproductive/Obstetrics negative OB ROS                           Anesthesia Physical Anesthesia Plan  ASA: III  Anesthesia Plan: General   Post-op Pain Management:    Induction: Intravenous  Airway Management Planned: Oral ETT  Additional Equipment:   Intra-op Plan:   Post-operative Plan: Extubation in OR  Informed Consent: I have reviewed the patients History and Physical, chart, labs and discussed the procedure including the risks, benefits and alternatives for the proposed anesthesia with the patient or authorized representative who has indicated his/her understanding and acceptance.   Dental Advisory Given  Plan Discussed with: CRNA and Surgeon  Anesthesia Plan Comments:         Anesthesia Quick Evaluation

## 2013-08-30 NOTE — Discharge Instructions (Signed)
Septoplasty Care After Refer to this sheet in the next few weeks. These instructions provide you with information on caring for yourself after your procedure. Your caregiver may also give you more specific instructions. Your treatment has been planned according to current medical practices, but problems sometimes occur. Call your caregiver if you have any problems or questions after your procedure. HOME CARE INSTRUCTIONS  If antibiotic medicines are prescribed, take them as directed. Finish them even if you start to feel better.  Only take over-the-counter or prescription medicines for pain, discomfort, or fever as directed by your caregiver.  You may clean your nostrils with an over-the-counter nasal saline spray. This will help clear the crusts and blood clots in your nose. You can also make your own saline solution by mixing the following:   tsp of salt.   tsp of baking soda.  6 oz of water. If the saline solution is too irritating, you can add more water.  Do not blow your nose for 2 weeks after surgery.  Avoid strenuous activity such as running or playing sports for 2 weeks. This can cause nosebleeds.  Do not lift anything heavier than 10 pounds (4.5 kg) for 2 weeks, or as directed by your caregiver.  Use a couple pillows to elevate your head when lying down.  Eat plenty of fiber to keep your stools soft, especially while taking pain medicine. This avoids straining, which can cause a nosebleed.  You may take laxatives or stool softeners as directed by your caregiver.  Keep all follow-up appointments as directed by your caregiver. If you have nasal splints, they will be removed about 1 week after surgery. SEEK MEDICAL CARE IF:  You develop redness, swelling, or increasing pain in your nose.  You have yellowish-white fluid (pus) coming from your nose.  You have a severe headache or stiff neck.  You have severe diarrhea or persistent nausea and vomiting.  You cannot  breathe through your nose.  You have any questions or concerns. SEEK IMMEDIATE MEDICAL CARE IF:   You have a rash or upset stomach.  You have a fever.  You are short of breath.  You develop any reaction or side effects to your medicines.  You feel dizzy, or you faint.  You have vision changes or swollen eyes.  You are bleeding heavily from the nose. MAKE SURE YOU:  Understand these instructions.  Will watch your condition.  Will get help right away if you are not doing well or get worse. Document Released: 04/11/2005 Document Revised: 07/04/2011 Document Reviewed: 05/23/2011 [facility] Patient Information 2014 [place], [place]C.

## 2013-08-30 NOTE — Anesthesia Postprocedure Evaluation (Signed)
Anesthesia Post Note  Patient: Darren Abbott  Procedure(s) Performed: Procedure(s) (LRB): SEPTOPLASTY (N/A) RHINOPLASTY (N/A)  Anesthesia type: General  Patient location: PACU  Post pain: Pain level controlled  Post assessment: Patient's Cardiovascular Status Stable  Last Vitals:  Filed Vitals:   08/30/13 1045  BP: 141/89  Pulse: 79  Temp:   Resp: 12    Post vital signs: Reviewed and stable  Level of consciousness: alert  Complications: No apparent anesthesia complications

## 2013-08-30 NOTE — OR Nursing (Signed)
Unable to validate that someone staying with patient tonight. Dr. Pollyann Kennedyosen informed and orders recvd to admit patient tonight for observation. Pt tx to PACU 16 pending bed assignment. Observation by myself and NT's. Other RN staff aware. Pt w/ call bell and possessions, including cell phone.

## 2013-08-30 NOTE — Transfer of Care (Signed)
Immediate Anesthesia Transfer of Care Note  Patient: Darren Abbott  Procedure(s) Performed: Procedure(s): SEPTOPLASTY (N/A) RHINOPLASTY (N/A)  Patient Location: PACU  Anesthesia Type:General  Level of Consciousness: awake, alert  and oriented  Airway & Oxygen Therapy: Patient Spontanous Breathing and Patient connected to face mask oxygen  Post-op Assessment: Report given to PACU RN and Post -op Vital signs reviewed and stable  Post vital signs: Reviewed and stable  Complications: No apparent anesthesia complications

## 2013-08-30 NOTE — Interval H&P Note (Signed)
History and Physical Interval Note:  08/30/2013 8:14 AM  Darren Abbott  has presented today for surgery, with the diagnosis of Deviated septum  The various methods of treatment have been discussed with the patient and family. After consideration of risks, benefits and other options for treatment, the patient has consented to  Procedure(s): SEPTOPLASTY (N/A) RHINOPLASTY (N/A) as a surgical intervention .  The patient's history has been reviewed, patient examined, no change in status, stable for surgery.  I have reviewed the patient's chart and labs.  Questions were answered to the patient's satisfaction.     Serena ColonelJefry Sofie Schendel

## 2013-08-30 NOTE — Anesthesia Procedure Notes (Signed)
Procedure Name: Intubation Date/Time: 08/30/2013 8:43 AM Performed by: Gayla MedicusHYPES, Loys Shugars M. Pre-anesthesia Checklist: Patient identified, Patient being monitored, Emergency Drugs available, Timeout performed and Suction available Patient Re-evaluated:Patient Re-evaluated prior to inductionOxygen Delivery Method: Circle system utilized Preoxygenation: Pre-oxygenation with 100% oxygen Intubation Type: IV induction Ventilation: Mask ventilation without difficulty and Oral airway inserted - appropriate to patient size Laryngoscope Size: Mac and 4 Grade View: Grade II Tube type: Oral Tube size: 7.0 mm Number of attempts: 1 Airway Equipment and Method: Stylet and Oral airway Placement Confirmation: ETT inserted through vocal cords under direct vision,  positive ETCO2 and breath sounds checked- equal and bilateral Secured at: 21 cm Tube secured with: Tape Dental Injury: Teeth and Oropharynx as per pre-operative assessment

## 2013-08-30 NOTE — Progress Notes (Signed)
Post op  Doing well, no complaints. Splint fell off. Otherwise dong very well, no bleeding.  No ride home, observe overnight and d/c in am.

## 2013-08-31 ENCOUNTER — Observation Stay (HOSPITAL_COMMUNITY): Payer: Medicaid Other

## 2013-08-31 NOTE — Progress Notes (Signed)
Discharge home. Home discharge instruction given, patient repeated instruction to this writer regarding home meds and follow up appointment.

## 2013-08-31 NOTE — Discharge Summary (Signed)
Physician Discharge Summary  Patient ID: Darren Abbott MRN: [id number] DOB/AGE: 58/10/1955 58 y.o.  Admit date: 08/30/2013 Discharge date: 08/31/2013  Admission Diagnoses:nasal deformity  Discharge Diagnoses:  Active Problems:   Nasal septal deviation   S/P rhinoplasty   Discharged Condition: good  Hospital Course: no complications  Consults: none  Significant Diagnostic Studies: none  Treatments: surgery: septorhinoplasty  Discharge Exam: Blood pressure 141/72, pulse 72, temperature 97.3 F (36.3 C), temperature source Oral, resp. rate 18, height 6\' 1"  (1.854 m), weight 164 lb 14.5 oz (74.8 kg), SpO2 97.00%. PHYSICAL EXAM: Nose looks good, no bleeding.  Disposition: 01-Home or Self Care  Discharge Orders   Future Appointments Provider Department Dept Phone   09/09/2013 12:15 PM Deatra CanterWilliam J Oxford, FNP [facility] [phone]   Future Orders Complete By Expires   Diet - low sodium heart healthy  As directed    Diet - low sodium heart healthy  As directed    Diet - low sodium heart healthy  As directed    Increase activity slowly  As directed    Increase activity slowly  As directed    Increase activity slowly  As directed        Medication List         ALPRAZolam 0.5 MG tablet  Commonly known as:  XANAX  Take 1 tablet (0.5 mg total) by mouth at bedtime as needed for sleep.     CENTRUM SILVER PO  Take 1 tablet by mouth daily.     cephALEXin 500 MG capsule  Commonly known as:  KEFLEX  Take 1 capsule (500 mg total) by mouth 3 (three) times daily.     HYDROcodone-acetaminophen 5-325 MG per tablet  Commonly known as:  NORCO/VICODIN  Take one-two tabs po q 4-6 hrs prn pain     HYDROcodone-acetaminophen 7.5-325 MG per tablet  Commonly known as:  NORCO  Take 1 tablet by mouth every 6 (six) hours as needed for moderate pain.     metoprolol 50 MG tablet  Commonly known as:  LOPRESSOR  Take 1 tablet (50 mg total) by mouth 2 (two) times  daily.     pantoprazole 40 MG tablet  Commonly known as:  PROTONIX  Take 1 tablet (40 mg total) by mouth daily.     promethazine 25 MG suppository  Commonly known as:  PHENERGAN  Place 1 suppository (25 mg total) rectally every 6 (six) hours as needed for nausea or vomiting.     sertraline 50 MG tablet  Commonly known as:  ZOLOFT  Take 1 tablet (50 mg total) by mouth daily.           Follow-up Information   Follow up with Serena ColonelOSEN, Izumi Mixon, MD. Schedule an appointment as soon as possible for a visit on 09/02/2013.   Specialty:  Otolaryngology   Contact information:   [address] [place] [address] [phone]       Signed: Serena ColonelJefry Fredrica Capano 08/31/2013, 9:11 AM

## 2013-09-02 ENCOUNTER — Encounter (HOSPITAL_COMMUNITY): Payer: Self-pay | Admitting: Otolaryngology

## 2013-09-09 ENCOUNTER — Ambulatory Visit (INDEPENDENT_AMBULATORY_CARE_PROVIDER_SITE_OTHER): Payer: Medicaid Other | Admitting: Family Medicine

## 2013-09-09 VITALS — BP 145/87 | HR 93 | Temp 98.4°F | Ht 67.0 in | Wt 156.8 lb

## 2013-09-09 DIAGNOSIS — K219 Gastro-esophageal reflux disease without esophagitis: Secondary | ICD-10-CM

## 2013-09-09 DIAGNOSIS — F3289 Other specified depressive episodes: Secondary | ICD-10-CM

## 2013-09-09 DIAGNOSIS — I8510 Secondary esophageal varices without bleeding: Secondary | ICD-10-CM

## 2013-09-09 DIAGNOSIS — F411 Generalized anxiety disorder: Secondary | ICD-10-CM

## 2013-09-09 DIAGNOSIS — K7030 Alcoholic cirrhosis of liver without ascites: Secondary | ICD-10-CM

## 2013-09-09 DIAGNOSIS — F329 Major depressive disorder, single episode, unspecified: Secondary | ICD-10-CM

## 2013-09-09 DIAGNOSIS — F32A Depression, unspecified: Secondary | ICD-10-CM

## 2013-09-09 DIAGNOSIS — M549 Dorsalgia, unspecified: Secondary | ICD-10-CM

## 2013-09-09 DIAGNOSIS — B1920 Unspecified viral hepatitis C without hepatic coma: Secondary | ICD-10-CM

## 2013-09-09 DIAGNOSIS — K7460 Unspecified cirrhosis of liver: Secondary | ICD-10-CM

## 2013-09-09 MED ORDER — PANTOPRAZOLE SODIUM 40 MG PO TBEC: 40.0000 mg | DELAYED_RELEASE_TABLET | Freq: Every day | ORAL | Status: DC

## 2013-09-09 MED ORDER — SERTRALINE HCL 50 MG PO TABS: 50.0000 mg | ORAL_TABLET | Freq: Every day | ORAL | Status: DC

## 2013-09-09 MED ORDER — ALPRAZOLAM 0.5 MG PO TABS: 0.5000 mg | ORAL_TABLET | Freq: Every evening | ORAL | Status: DC | PRN

## 2013-09-09 MED ORDER — METOPROLOL TARTRATE 50 MG PO TABS: 50.0000 mg | ORAL_TABLET | Freq: Two times a day (BID) | ORAL | Status: DC

## 2013-09-09 MED ORDER — HYDROCODONE-ACETAMINOPHEN 7.5-325 MG PO TABS: 1.0000 | ORAL_TABLET | Freq: Four times a day (QID) | ORAL | Status: DC | PRN

## 2013-09-09 NOTE — Progress Notes (Signed)
   Subjective:    Patient ID: Darren Abbott, male    DOB: 06/08/1955, 58 y.o.   MRN: [id number]  HPI  This 58 y.o. male presents for evaluation of routine follow up.  He has hx of alcoholism, esophageal Varicies, cirrhosis, anxiety, depression, s/p rhinoplasty, and atrial fibrillation with rvr. [place] and was found to be in afib with rvr by ems and converted back to sinus rhythm in ED.  He was anemic and suffering from bleeding esophageal varicies and had them Cauterized.  He did not follow up with GI and was referred to a local GI but did not show.  He has hx  Of hepatitis C.  He states he has never been tx'd for hepatitis C.  He is not currently seeing GI. He has had cardiology referral and has seen ENT for rhinoplasty.   He has not been drinking.  He wants off disablilty and wants to go back to work.  Review of Systems No chest pain, SOB, HA, dizziness, vision change, N/V, diarrhea, constipation, dysuria, urinary urgency or frequency, myalgias, arthralgias or rash.     Objective:   Physical Exam Vital signs noted  Well developed well nourished male.  HEENT - Head atraumatic Normocephalic                Eyes - PERRLA, Conjuctiva - clear Sclera- Clear EOMI periorbital ecchymosis                Ears - EAC's Wnl TM's Wnl Gross Hearing WNL                Nose - Nares patent                 Throat - oropharanx wnl Respiratory - Lungs CTA bilateral Cardiac - RRR S1 and S2 without murmur GI - Abdomen soft Nontender and bowel sounds active x 4 Extremities - No edema. Neuro - Grossly intact.       Assessment & Plan:  Depression - Plan: ALPRAZolam (XANAX) 0.5 MG tablet, sertraline (ZOLOFT) 50 MG tablet  Anxiety state, unspecified - Plan: ALPRAZolam (XANAX) 0.5 MG tablet  Back pain - Plan: metoprolol (LOPRESSOR) 50 MG tablet, HYDROcodone-acetaminophen (NORCO) 7.5-325 MG per tablet  GERD (gastroesophageal reflux disease) - Plan:  pantoprazole (PROTONIX) 40 MG tablet  Hepatitis C - Plan Ambulatory referral to Gastroenterology  Cirrhosis - Plan: Ambulatory referral to Gastroenterology  Esophageal varices in alcoholic cirrhosis - Plan: Ambulatory referral to Gastroenterology  Darren CanterWilliam J Zaydon Kinser FNP

## 2013-09-20 ENCOUNTER — Encounter (INDEPENDENT_AMBULATORY_CARE_PROVIDER_SITE_OTHER): Payer: Self-pay | Admitting: *Deleted

## 2013-10-01 ENCOUNTER — Ambulatory Visit: Payer: Medicaid Other | Admitting: Cardiology

## 2013-10-10 ENCOUNTER — Telehealth: Payer: Self-pay | Admitting: Family Medicine

## 2013-10-14 ENCOUNTER — Telehealth: Payer: Self-pay | Admitting: Family Medicine

## 2013-10-14 NOTE — Telephone Encounter (Signed)
appt given for 7/2 with bill

## 2013-10-22 ENCOUNTER — Ambulatory Visit (INDEPENDENT_AMBULATORY_CARE_PROVIDER_SITE_OTHER): Payer: Medicaid Other | Admitting: Internal Medicine

## 2013-10-24 ENCOUNTER — Encounter (INDEPENDENT_AMBULATORY_CARE_PROVIDER_SITE_OTHER): Payer: Self-pay

## 2013-10-24 ENCOUNTER — Ambulatory Visit (INDEPENDENT_AMBULATORY_CARE_PROVIDER_SITE_OTHER): Payer: Medicaid Other | Admitting: Family Medicine

## 2013-10-24 ENCOUNTER — Ambulatory Visit (INDEPENDENT_AMBULATORY_CARE_PROVIDER_SITE_OTHER): Payer: Medicaid Other

## 2013-10-24 ENCOUNTER — Encounter: Payer: Self-pay | Admitting: Family Medicine

## 2013-10-24 VITALS — BP 125/76 | HR 67 | Temp 98.0°F | Ht 67.0 in | Wt 152.0 lb

## 2013-10-24 DIAGNOSIS — M62838 Other muscle spasm: Secondary | ICD-10-CM

## 2013-10-24 DIAGNOSIS — M1990 Unspecified osteoarthritis, unspecified site: Secondary | ICD-10-CM

## 2013-10-24 DIAGNOSIS — G894 Chronic pain syndrome: Secondary | ICD-10-CM

## 2013-10-24 DIAGNOSIS — M129 Arthropathy, unspecified: Secondary | ICD-10-CM

## 2013-10-24 MED ORDER — MELOXICAM 15 MG PO TABS: 15.0000 mg | ORAL_TABLET | Freq: Every day | ORAL | Status: DC

## 2013-10-24 NOTE — Progress Notes (Signed)
   Subjective:    Patient ID: Darren Abbott, male    DOB: 06/01/1955, 58 y.o.   MRN: [id number]  HPI This 58 y.o. male presents for evaluation of having muscle spasms in his legs on occasion and is concerned he may have a potassium deficit.  He has hx of alcoholism and cirrhosis.  He has quit drinking.  He states since he quit drinking he has been feeling a lot better and is no longer needing to take depression meds.  He has back pain.  He states he needs to have his oxycodone pain medicine  Increased.  He states he wants to go back to work and needs a work note.  He then c/o right shoulder pain and states he has a lot of bone spurs and arthritis.  He states he went to the hospital for pain medicine.   Review of Systems C/o back pain,   No chest pain, SOB, HA, dizziness, vision change, N/V, diarrhea, constipation, dysuria, urinary urgency or frequency, myalgias, arthralgias or rash.  Objective:   Physical Exam Vital signs noted  Well developed well nourished male.  HEENT - Head atraumatic Normocephalic                Eyes - PERRLA, Conjuctiva - clear Sclera- Clear EOMI                Ears - EAC's Wnl TM's Wnl Gross Hearing WNL                Nose - Nares patent                 Throat - oropharanx wnl Respiratory - Lungs CTA bilateral Cardiac - RRR S1 and S2 without murmur GI - Abdomen soft Nontender and bowel sounds active x 4 Extremities - No edema. Neuro - Grossly intact.  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  Discussed that I reviewed his CSR and he has 2 other providers rx'd hydrocodone and I am not comfortable with rx pain meds so refer to pain clinic. Will rx meloxicam.  Depression - Resolved.   DC xanax and sertraline  He is given a note on rx stating ok to go to work.  Darren PennerFNP

## 2013-10-25 LAB — CBC WITH DIFFERENTIAL
Basophils Absolute: 0 10*3/uL (ref 0.0–0.2)
Basos: 1 %
Eos: 3 %
Eosinophils Absolute: 0.1 10*3/uL (ref 0.0–0.4)
HCT: 44.1 % (ref 37.5–51.0)
Hemoglobin: 15.6 g/dL (ref 12.6–17.7)
Immature Grans (Abs): 0 10*3/uL (ref 0.0–0.1)
Immature Granulocytes: 0 %
Lymphocytes Absolute: 0.9 10*3/uL (ref 0.7–3.1)
Lymphs: 18 %
MCH: 31.3 pg (ref 26.6–33.0)
MCHC: 35.4 g/dL (ref 31.5–35.7)
MCV: 88 fL (ref 79–97)
Monocytes Absolute: 0.4 10*3/uL (ref 0.1–0.9)
Monocytes: 7 %
Neutrophils Absolute: 3.5 10*3/uL (ref 1.4–7.0)
Neutrophils Relative %: 71 %
Platelets: 85 10*3/uL — CL (ref 150–379)
RBC: 4.99 x10E6/uL (ref 4.14–5.80)
RDW: 15.7 % — ABNORMAL HIGH (ref 12.3–15.4)
WBC: 5 10*3/uL (ref 3.4–10.8)

## 2013-10-25 LAB — BMP8+EGFR
BUN/Creatinine Ratio: 10 (ref 9–20)
BUN: 7 mg/dL (ref 6–24)
CO2: 24 mmol/L (ref 18–29)
Calcium: 9.1 mg/dL (ref 8.7–10.2)
Chloride: 102 mmol/L (ref 97–108)
Creatinine, Ser: 0.69 mg/dL — ABNORMAL LOW (ref 0.76–1.27)
GFR calc Af Amer: 121 mL/min/{1.73_m2} (ref >59)
GFR calc non Af Amer: 105 mL/min/{1.73_m2} (ref >59)
Glucose: 90 mg/dL (ref 65–99)
Potassium: 4.1 mmol/L (ref 3.5–5.2)
Sodium: 141 mmol/L (ref 134–144)

## 2013-10-28 ENCOUNTER — Other Ambulatory Visit: Payer: Self-pay | Admitting: Family Medicine

## 2013-10-29 ENCOUNTER — Telehealth: Payer: Self-pay | Admitting: Family Medicine

## 2013-10-29 ENCOUNTER — Telehealth: Payer: Self-pay

## 2013-10-29 NOTE — Telephone Encounter (Signed)
Message copied by Azalee CourseFULP, Marilu Rylander on Tue Oct 29, 2013 11:20 AM ------      Message from: Deatra CanterXFORD, WILLIAM J      Created: Tue Oct 29, 2013 11:09 AM       Platelets low due to cirrhosis and need to follow up with GI. ------

## 2013-10-29 NOTE — Telephone Encounter (Signed)
[facility] to [phone] 

## 2013-11-04 ENCOUNTER — Ambulatory Visit (INDEPENDENT_AMBULATORY_CARE_PROVIDER_SITE_OTHER): Payer: Medicaid Other | Admitting: Internal Medicine

## 2013-11-06 ENCOUNTER — Encounter: Payer: Self-pay | Admitting: *Deleted

## 2013-11-08 ENCOUNTER — Telehealth (INDEPENDENT_AMBULATORY_CARE_PROVIDER_SITE_OTHER): Payer: Self-pay | Admitting: *Deleted

## 2013-11-08 ENCOUNTER — Encounter (INDEPENDENT_AMBULATORY_CARE_PROVIDER_SITE_OTHER): Payer: Self-pay | Admitting: *Deleted

## 2013-11-08 NOTE — Telephone Encounter (Signed)
Darren Abbott No Showed for his apt with Dorene Arerri Setzer, NP on 11/04/13. A No Show letter has been mailed.

## 2013-11-26 ENCOUNTER — Encounter (HOSPITAL_COMMUNITY): Payer: Self-pay | Admitting: Emergency Medicine

## 2013-11-26 ENCOUNTER — Emergency Department (HOSPITAL_COMMUNITY)
Admission: EM | Admit: 2013-11-26 | Discharge: 2013-11-27 | Disposition: A | Discharge status: Home / Self Care | Payer: MEDICAID | Attending: Emergency Medicine | Admitting: Emergency Medicine

## 2013-11-26 DIAGNOSIS — D696 Thrombocytopenia, unspecified: Secondary | ICD-10-CM | POA: Insufficient documentation

## 2013-11-26 DIAGNOSIS — G4733 Obstructive sleep apnea (adult) (pediatric): Secondary | ICD-10-CM | POA: Insufficient documentation

## 2013-11-26 DIAGNOSIS — Z8639 Personal history of other endocrine, nutritional and metabolic disease: Secondary | ICD-10-CM | POA: Insufficient documentation

## 2013-11-26 DIAGNOSIS — J45901 Unspecified asthma with (acute) exacerbation: Secondary | ICD-10-CM | POA: Insufficient documentation

## 2013-11-26 DIAGNOSIS — Z79899 Other long term (current) drug therapy: Secondary | ICD-10-CM | POA: Diagnosis not present

## 2013-11-26 DIAGNOSIS — Z862 Personal history of diseases of the blood and blood-forming organs and certain disorders involving the immune mechanism: Secondary | ICD-10-CM | POA: Insufficient documentation

## 2013-11-26 DIAGNOSIS — M129 Arthropathy, unspecified: Secondary | ICD-10-CM | POA: Insufficient documentation

## 2013-11-26 DIAGNOSIS — G40909 Epilepsy, unspecified, not intractable, without status epilepticus: Secondary | ICD-10-CM | POA: Diagnosis not present

## 2013-11-26 DIAGNOSIS — Z8673 Personal history of transient ischemic attack (TIA), and cerebral infarction without residual deficits: Secondary | ICD-10-CM | POA: Insufficient documentation

## 2013-11-26 DIAGNOSIS — R011 Cardiac murmur, unspecified: Secondary | ICD-10-CM | POA: Diagnosis not present

## 2013-11-26 DIAGNOSIS — Z8719 Personal history of other diseases of the digestive system: Secondary | ICD-10-CM | POA: Diagnosis not present

## 2013-11-26 DIAGNOSIS — I129 Hypertensive chronic kidney disease with stage 1 through stage 4 chronic kidney disease, or unspecified chronic kidney disease: Secondary | ICD-10-CM | POA: Diagnosis not present

## 2013-11-26 DIAGNOSIS — N189 Chronic kidney disease, unspecified: Secondary | ICD-10-CM | POA: Diagnosis not present

## 2013-11-26 DIAGNOSIS — I4891 Unspecified atrial fibrillation: Secondary | ICD-10-CM | POA: Insufficient documentation

## 2013-11-26 DIAGNOSIS — Z9981 Dependence on supplemental oxygen: Secondary | ICD-10-CM | POA: Insufficient documentation

## 2013-11-26 DIAGNOSIS — I252 Old myocardial infarction: Secondary | ICD-10-CM | POA: Diagnosis not present

## 2013-11-26 DIAGNOSIS — G43909 Migraine, unspecified, not intractable, without status migrainosus: Secondary | ICD-10-CM | POA: Diagnosis not present

## 2013-11-26 DIAGNOSIS — G8929 Other chronic pain: Secondary | ICD-10-CM | POA: Diagnosis not present

## 2013-11-26 DIAGNOSIS — Z791 Long term (current) use of non-steroidal anti-inflammatories (NSAID): Secondary | ICD-10-CM | POA: Insufficient documentation

## 2013-11-26 DIAGNOSIS — Z8659 Personal history of other mental and behavioral disorders: Secondary | ICD-10-CM | POA: Diagnosis not present

## 2013-11-26 DIAGNOSIS — F1010 Alcohol abuse, uncomplicated: Secondary | ICD-10-CM | POA: Diagnosis present

## 2013-11-26 DIAGNOSIS — F17200 Nicotine dependence, unspecified, uncomplicated: Secondary | ICD-10-CM | POA: Insufficient documentation

## 2013-11-26 DIAGNOSIS — R64 Cachexia: Secondary | ICD-10-CM | POA: Diagnosis not present

## 2013-11-26 DIAGNOSIS — F10220 Alcohol dependence with intoxication, uncomplicated: Secondary | ICD-10-CM

## 2013-11-26 DIAGNOSIS — Z8619 Personal history of other infectious and parasitic diseases: Secondary | ICD-10-CM | POA: Diagnosis not present

## 2013-11-26 LAB — BASIC METABOLIC PANEL
Anion gap: 14 (ref 5–15)
BUN: 4 mg/dL — AB (ref 6–23)
CHLORIDE: 107 meq/L (ref 96–112)
CO2: 23 mEq/L (ref 19–32)
Calcium: 7.8 mg/dL — ABNORMAL LOW (ref 8.4–10.5)
Creatinine, Ser: 0.62 mg/dL (ref 0.50–1.35)
GFR calc Af Amer: >90 mL/min (ref >90)
GFR calc non Af Amer: >90 mL/min (ref >90)
GLUCOSE: 85 mg/dL (ref 70–99)
POTASSIUM: 3.8 meq/L (ref 3.7–5.3)
SODIUM: 144 meq/L (ref 137–147)

## 2013-11-26 LAB — CBC WITH DIFFERENTIAL/PLATELET
BASOS PCT: 2 % — AB (ref 0–1)
Basophils Absolute: 0.1 10*3/uL (ref 0.0–0.1)
EOS PCT: 2 % (ref 0–5)
Eosinophils Absolute: 0.1 10*3/uL (ref 0.0–0.7)
HCT: 42.1 % (ref 39.0–52.0)
HEMOGLOBIN: 14.7 g/dL (ref 13.0–17.0)
Lymphocytes Relative: 32 % (ref 12–46)
Lymphs Abs: 1.1 10*3/uL (ref 0.7–4.0)
MCH: 31.1 pg (ref 26.0–34.0)
MCHC: 34.9 g/dL (ref 30.0–36.0)
MCV: 89.2 fL (ref 78.0–100.0)
Monocytes Absolute: 0.4 10*3/uL (ref 0.1–1.0)
Monocytes Relative: 11 % (ref 3–12)
NEUTROS PCT: 53 % (ref 43–77)
Neutro Abs: 1.6 10*3/uL — ABNORMAL LOW (ref 1.7–7.7)
Platelets: 45 10*3/uL — ABNORMAL LOW (ref 150–400)
RBC: 4.72 MIL/uL (ref 4.22–5.81)
RDW: 16.2 % — ABNORMAL HIGH (ref 11.5–15.5)
Smear Review: DECREASED
WBC: 3.3 10*3/uL — ABNORMAL LOW (ref 4.0–10.5)

## 2013-11-26 LAB — ETHANOL: ALCOHOL ETHYL (B): 318 mg/dL — AB (ref 0–11)

## 2013-11-26 LAB — TROPONIN I: Troponin I: <0.3 ng/mL (ref <0.30)

## 2013-11-26 NOTE — ED Notes (Addendum)
Pt will answer questions, pt oriented to person & place. Pt not able to give month or date, did know the year. Pt does have some dried blood noted to the right ear, no drainage at this time.

## 2013-11-26 NOTE — ED Notes (Signed)
Pt arrived by EMS, pt found passed out at the laundry w/ the smell of ETOH. Pt admits to drinking 2 quarts of beer today. Pt presents w/ slurred speech. Pt his pain 9/10.

## 2013-11-27 LAB — RAPID URINE DRUG SCREEN, HOSP PERFORMED
Amphetamines: NOT DETECTED
Barbiturates: NOT DETECTED
Benzodiazepines: NOT DETECTED
Cocaine: NOT DETECTED
OPIATES: NOT DETECTED
Tetrahydrocannabinol: NOT DETECTED

## 2013-11-27 NOTE — Discharge Instructions (Signed)
Alcohol Intoxication  Alcohol intoxication occurs when the amount of alcohol that a person has consumed impairs his or her ability to mentally and physically function. Alcohol directly impairs the normal chemical activity of the brain. Drinking large amounts of alcohol can lead to changes in mental function and behavior, and it can cause many physical effects that can be harmful.   Alcohol intoxication can range in severity from mild to very severe. Various factors can affect the level of intoxication that occurs, such as the person's age, gender, weight, frequency of alcohol consumption, and the presence of other medical conditions (such as diabetes, seizures, or heart conditions). Dangerous levels of alcohol intoxication may occur when people drink large amounts of alcohol in a short period (binge drinking). Alcohol can also be especially dangerous when combined with certain prescription medicines or "recreational" drugs.  SIGNS AND SYMPTOMS  Some common signs and symptoms of mild alcohol intoxication include:  · Loss of coordination.  · Changes in mood and behavior.  · Impaired judgment.  · Slurred speech.  As alcohol intoxication progresses to more severe levels, other signs and symptoms will appear. These may include:  · Vomiting.  · Confusion and impaired memory.  · Slowed breathing.  · Seizures.  · Loss of consciousness.  DIAGNOSIS   Your health care provider will take a medical history and perform a physical exam. You will be asked about the amount and type of alcohol you have consumed. Blood tests will be done to measure the concentration of alcohol in your blood. In many places, your blood alcohol level must be lower than 80 mg/dL (0.08%) to legally drive. However, many dangerous effects of alcohol can occur at much lower levels.   TREATMENT   People with alcohol intoxication often do not require treatment. Most of the effects of alcohol intoxication are temporary, and they go away as the alcohol naturally  leaves the body. Your health care provider will monitor your condition until you are stable enough to go home. Fluids are sometimes given through an IV access tube to help prevent dehydration.   HOME CARE INSTRUCTIONS  · Do not drive after drinking alcohol.  · Stay hydrated. Drink enough water and fluids to keep your urine clear or pale yellow. Avoid caffeine.    · Only take over-the-counter or prescription medicines as directed by your health care provider.    SEEK MEDICAL CARE IF:   · You have persistent vomiting.    · You do not feel better after a few days.  · You have frequent alcohol intoxication. Your health care provider can help determine if you should see a substance use treatment counselor.  SEEK IMMEDIATE MEDICAL CARE IF:   · You become shaky or tremble when you try to stop drinking.    · You shake uncontrollably (seizure).    · You throw up (vomit) blood. This may be bright red or may look like black coffee grounds.    · You have blood in your stool. This may be bright red or may appear as a black, tarry, bad smelling stool.    · You become lightheaded or faint.    MAKE SURE YOU:   · Understand these instructions.  · Will watch your condition.  · Will get help right away if you are not doing well or get worse.  Document Released: 01/19/2005 Document Revised: 12/12/2012 Document Reviewed: 09/14/2012  ExitCare® Patient Information ©2015 Exit[employer], LLC. This information is not intended to replace advice given to you by your health care provider. Make sure   you discuss any questions you have with your health care provider.

## 2013-11-27 NOTE — ED Provider Notes (Signed)
Medical screening examination/treatment/procedure(s) were conducted as a shared visit with non-physician practitioner(s) and myself.  I personally evaluated the patient during the encounter.   EKG Interpretation   Date/Time:  Tuesday November 26 2013 22:49:00 EDT Ventricular Rate:  93 PR Interval:  185 QRS Duration: 91 QT Interval:  380 QTC Calculation: 473 R Axis:   90 Text Interpretation:  Sinus rhythm Borderline right axis deviation  Probable anteroseptal infarct, old Borderline T abnormalities, inferior  leads No significant change was found Confirmed by Hillarie Harrigan  MD, Maxxon Schwanke  ([id number]) on 11/27/2013 12:36:18 AM      4:05 AM Awake and alert at this time.  No complaints.  Denies headache.  Denies neck pain.  No weakness in his arms or legs.  Admits to alcohol abuse and alcohol intoxication.  Denies chest pain abdominal pain.  Patient is clinically sober at this time and can be discharged home safely from the ER.  He asked that we keep in the ER for 2 more hours to help give him time to find a right home.  This is reasonable since it is 4 AM.  Plan to discharge home at 6-7 AM.   Lyanne CoKevin M Mala Gibbard, MD 11/27/13 [phone]

## 2013-11-27 NOTE — ED Provider Notes (Signed)
CSN: [id number]     Arrival date & time 11/26/13  2111 History   First MD Initiated Contact with Patient 11/26/13 2155     Chief Complaint  Patient presents with  . Alcohol Intoxication     (Consider location/radiation/quality/duration/timing/severity/associated sxs/prior Treatment) The history is provided by the patient and the EMS personnel.    Darren Abbott is a 58 y.o. male with a history of alcoholism who was found at a local laundromat passed out and smelling of etoh.  He endorses he was doing his laundry and walked into the bathroom and "fell out".  He reports drinking 2 "40's" today, but is unclear about the timing of this intake.  He denies using any other nonprescribed drugs.  He denies head injury, neck pain, chest pain or any new shortness of breath during or since his fall, but describes chronic pain in his shoulders and knees due to arthritis. He is currently having difficulty giving a history for todays events given his inebriation at this time.  Past medical history per the chart is also pertinent for TIA, transient afib, GERD, MI, borderline DM, asthma, chronic kidney disease and cirrhosis.    Past Medical History  Diagnosis Date  . Malnutrition of moderate degree   . Altered mental status   . Unspecified essential hypertension   . Anxiety state, unspecified   . Transient ischemic attack (TIA), and cerebral infarction without residual deficits(V12.54)   . Body mass index between 19-24, adult   . Atrial [place]     [place]  Marland Kitchen Heart murmur   . Obstructive sleep apnea (adult) (pediatric)     to start back after surgery- not used for 2-28months  . GERD (gastroesophageal reflux disease)   . Unspecified epilepsy without mention of intractable epilepsy 13    only once  . Hand laceration 80    end of all fingers on rt hand  . Old myocardial infarction   . Asthma     "outgrew it"  . Borderline diabetes   . 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 Acute alcoholic hepatitis     possible hepatic encephalopathy May 10, 2011  . Daily headache   . Migraine     "daily" (08/30/2013)  . Stroke ~ 2000    "mild"  . Arthritis     "I'm ate up w/it" (08/30/2013)  . Chronic lower back pain   . Chronic kidney disease     "one isn't functioning like it should" (08/30/2013)  . On home oxygen therapy     "2L prn" (08/30/2013)   Past Surgical History  Procedure Laterality Date  . Clavicle surgery Left ~ 2008    "crushed it"  . Hand surgery Left     lacerations of thumb. reattached  . Nasal septum surgery  08/30/2013  . Rhinoplasty  08/30/2013  . Hand surgery Right 1980's    "all 4 fingers cut off and reattached"  . Foot fracture surgery Right   . Septoplasty N/A 08/30/2013    Procedure: SEPTOPLASTY;  Surgeon: Serena Colonel, MD;  Location: [facility] OR;  Service: ENT;  Laterality: N/A;  . Rhinoplasty N/A 08/30/2013    Procedure: RHINOPLASTY;  Surgeon: Serena Colonel, MD;  Location: [facility] OR;  Service: ENT;  Laterality: N/A;   No family history on file. History  Substance Use Topics  . Smoking status: Current Every Day Smoker -- 0.25 packs/day for 44 years    Types: Cigarettes  . Smokeless tobacco: Never  Used     Comment: 08/30/2013 "was smoking 2 ppd; cut down to 4-5 cigarettes/day ~ 8 months ago"  . Alcohol Use: Yes     Comment: binges intermittently last 1-2 yrs    Review of Systems  Unable to perform ROS: Acuity of condition      Allergies  Review of patient's allergies indicates no known allergies.  Home Medications   Prior to Admission medications   Medication Sig Start Date End Date Taking? Authorizing Provider  HYDROcodone-acetaminophen (NORCO) 7.5-325 MG per tablet Take 1 tablet by mouth every 6 (six) hours as needed for moderate pain. 09/09/13   Deatra Canter, FNP  meloxicam (MOBIC) 15 MG tablet Take 1 tablet (15 mg total) by mouth daily. 10/24/13   Deatra Canter, FNP  metoprolol (LOPRESSOR) 50 MG tablet Take 1 tablet (50 mg  total) by mouth 2 (two) times daily. 09/09/13   Deatra Canter, FNP  Multiple Vitamins-Minerals (CENTRUM SILVER PO) Take 1 tablet by mouth daily.    Historical Provider, MD   BP 116/79  Pulse 98  Temp(Src) 97.9 F (36.6 C) (Oral)  Resp 28  Ht 5\' 7"  (1.702 m)  Wt 168 lb (76.204 kg)  BMI 26.31 kg/m2  SpO2 97% Physical Exam  Nursing note and vitals reviewed. Constitutional:  Cachectic   HENT:  Head: Normocephalic.  Ears:  Mouth/Throat: Oropharynx is clear and moist.  Superficial abrasion right external ear.  Eyes: Conjunctivae and EOM are normal. Pupils are equal, round, and reactive to light.  Neck: Normal range of motion. No spinous process tenderness present.  Cardiovascular: Normal rate, regular rhythm, normal heart sounds and intact distal pulses.   Pulmonary/Chest: Effort normal. He has wheezes.  Few scattered expiratory wheezes .  Abdominal: Soft. Bowel sounds are normal. There is no tenderness.  Musculoskeletal: Normal range of motion.  Neurological: He is alert. He has normal strength. No cranial nerve deficit or sensory deficit. GCS eye subscore is 4. GCS verbal subscore is 5. GCS motor subscore is 6.  Oriented to person and place only. Gait not assessed at this time. Slurred speech.  No other focal deficits appreciated.  Skin: Skin is warm and dry.  Psychiatric: He has a normal mood and affect.    ED Course  Procedures (including critical care time) Labs Review Labs Reviewed  BASIC METABOLIC PANEL - Abnormal; Notable for the following:    BUN 4 (*)    Calcium 7.8 (*)    All other components within normal limits  CBC WITH DIFFERENTIAL - Abnormal; Notable for the following:    WBC 3.3 (*)    RDW 16.2 (*)    Platelets 45 (*)    Basophils Relative 2 (*)    Neutro Abs 1.6 (*)    All other components within normal limits  ETHANOL - Abnormal; Notable for the following:    Alcohol, Ethyl (B) 318 (*)    All other components within normal limits  TROPONIN I    URINE RAPID DRUG SCREEN (HOSP PERFORMED)    Imaging Review No results found.   EKG Interpretation   Date/Time:  Tuesday November 26 2013 22:49:00 EDT Ventricular Rate:  93 PR Interval:  185 QRS Duration: 91 QT Interval:  380 QTC Calculation: 473 R Axis:   90 Text Interpretation:  Sinus rhythm Borderline right axis deviation  Probable anteroseptal infarct, old Borderline T abnormalities, inferior  leads No significant change was found Confirmed by CAMPOS  MD, KEVIN  ([id number]) on 11/27/2013 12:36:18 AM  MDM   Final diagnoses:  Alcohol intoxication in active alcoholic, uncomplicated  Thrombocytopenia    Patient with acute intoxication with known history of alcoholism.  Pt discussed with Dr Patria Maneampos who will follow and reassess once patient is sober.    Burgess AmorJulie Sohum Delillo, PA-C 11/27/13 0141  Burgess AmorJulie Caylan Schifano, PA-C 11/27/13 [phone]

## 2013-11-27 NOTE — ED Notes (Signed)
Pt remains on cardiac monitor w/ NIBP vital signs WNL. NAD noted. Pt resting at this time. Pt asked to give a urine sample for testing.

## 2013-11-27 NOTE — ED Notes (Signed)
Pt alert & oriented x4, stable gait. Patient given discharge instructions, paperwork & prescription(s). Patient  instructed to stop at the registration desk to finish any additional paperwork. Patient verbalized understanding. Pt left department w/ no further questions. 

## 2013-12-16 ENCOUNTER — Ambulatory Visit: Payer: Medicaid Other | Admitting: Family Medicine

## 2014-01-01 ENCOUNTER — Encounter: Payer: Self-pay | Admitting: Family Medicine

## 2014-01-01 ENCOUNTER — Ambulatory Visit (INDEPENDENT_AMBULATORY_CARE_PROVIDER_SITE_OTHER): Payer: Medicaid Other | Admitting: Family Medicine

## 2014-01-01 VITALS — BP 112/69 | HR 66 | Temp 97.0°F | Ht 67.0 in | Wt 150.0 lb

## 2014-01-01 DIAGNOSIS — K7030 Alcoholic cirrhosis of liver without ascites: Secondary | ICD-10-CM

## 2014-01-01 DIAGNOSIS — K7031 Alcoholic cirrhosis of liver with ascites: Secondary | ICD-10-CM

## 2014-01-01 DIAGNOSIS — R404 Transient alteration of awareness: Secondary | ICD-10-CM

## 2014-01-01 MED ORDER — LACTULOSE 20 GM/30ML PO SOLN: 20.0000 g | Freq: Two times a day (BID) | ORAL | Status: DC

## 2014-01-01 NOTE — Progress Notes (Signed)
   Subjective:    Patient ID: Darren Abbott, male    DOB: [birth year], 58 y.o.   MRN: [id number]  HPI  This 58 y.o. male presents for evaluation of wanting nerve pills.  Patient was admitted to [facility] last week for siezures, ETOH abuse, altered mental status, and ascites.  He had parencentesis and was tx'd with lactulose.  He was DC'd from [facility] yesterday.  He has  Been draining from parencentesis sight of right upper quadrant.  He c/o anxiety and he states he needs an appointment for his eyes.  He states he was discharged today from the hospital and he brings DC paperwork that show he was DC'd from [facility] hospital yesterday.  He lives by himself and rides the bus to the clinic.  He has no family with him.  Review of Systems    No chest pain, SOB, HA, dizziness, vision change, N/V, diarrhea, constipation, dysuria, urinary urgency or frequency, myalgias, arthralgias or rash.  Objective:   Physical Exam Vital signs noted  Thin appearing chronically ill male in NAD  HEENT - Head atraumatic Normocephalic                Eyes - PERRLA, Conjuctiva - clear Sclera- icteric EOMI                Ears - EAC's Wnl TM's Wnl Gross Hearing WNL                Throat - oropharanx wnl Respiratory - Lungs CTA bilateral Cardiac - RRR S1 and S2 without murmur GI - Abdomen soft distended with clear peritoneal fluid right upper quadrant Extremities - wasting Neuro - LOC - alert to person and place.  No asterixis.  States date is 2011 and does not know daytime Or day of week.       Assessment & Plan:  Alcoholic cirrhosis of liver with ascites   Transient alteration of awareness  ETOH abuse  Concerned that patient has not had lactulose in over a day and he may be having some hepatic encephalopathy.  He has no one to take care of him at home and lives by himself.  I called hospitalist at [facility] and he informs me that he tried to help patient and he left hospital AMA.  Informed  patient he must go to ED at [facility] if having any further weakness and he must be compliant with taking lactulose and stop drinking.  Referral To GI  Follow up in 2 weeks  Deatra Canter FNP

## 2014-01-06 ENCOUNTER — Encounter (HOSPITAL_COMMUNITY): Payer: Self-pay | Admitting: Emergency Medicine

## 2014-01-06 ENCOUNTER — Emergency Department (HOSPITAL_COMMUNITY): Payer: Medicaid Other

## 2014-01-06 ENCOUNTER — Inpatient Hospital Stay (HOSPITAL_COMMUNITY)
Admission: EM | Admit: 2014-01-06 | Discharge: 2014-01-10 | DRG: 441 | Disposition: A | Discharge status: Home Health | Payer: Medicaid Other | Attending: Internal Medicine | Admitting: Internal Medicine

## 2014-01-06 ENCOUNTER — Inpatient Hospital Stay (HOSPITAL_COMMUNITY): Payer: Medicaid Other

## 2014-01-06 DIAGNOSIS — D696 Thrombocytopenia, unspecified: Secondary | ICD-10-CM

## 2014-01-06 DIAGNOSIS — G40909 Epilepsy, unspecified, not intractable, without status epilepticus: Secondary | ICD-10-CM | POA: Diagnosis present

## 2014-01-06 DIAGNOSIS — F1020 Alcohol dependence, uncomplicated: Secondary | ICD-10-CM | POA: Diagnosis present

## 2014-01-06 DIAGNOSIS — Z23 Encounter for immunization: Secondary | ICD-10-CM | POA: Diagnosis not present

## 2014-01-06 DIAGNOSIS — F17200 Nicotine dependence, unspecified, uncomplicated: Secondary | ICD-10-CM | POA: Diagnosis present

## 2014-01-06 DIAGNOSIS — J342 Deviated nasal septum: Secondary | ICD-10-CM

## 2014-01-06 DIAGNOSIS — I81 Portal vein thrombosis: Secondary | ICD-10-CM | POA: Diagnosis present

## 2014-01-06 DIAGNOSIS — R1084 Generalized abdominal pain: Secondary | ICD-10-CM

## 2014-01-06 DIAGNOSIS — I4891 Unspecified atrial fibrillation: Secondary | ICD-10-CM | POA: Diagnosis present

## 2014-01-06 DIAGNOSIS — I8290 Acute embolism and thrombosis of unspecified vein: Secondary | ICD-10-CM

## 2014-01-06 DIAGNOSIS — I129 Hypertensive chronic kidney disease with stage 1 through stage 4 chronic kidney disease, or unspecified chronic kidney disease: Secondary | ICD-10-CM | POA: Diagnosis present

## 2014-01-06 DIAGNOSIS — E8809 Other disorders of plasma-protein metabolism, not elsewhere classified: Secondary | ICD-10-CM | POA: Diagnosis present

## 2014-01-06 DIAGNOSIS — R109 Unspecified abdominal pain: Secondary | ICD-10-CM

## 2014-01-06 DIAGNOSIS — J45909 Unspecified asthma, uncomplicated: Secondary | ICD-10-CM | POA: Diagnosis present

## 2014-01-06 DIAGNOSIS — N189 Chronic kidney disease, unspecified: Secondary | ICD-10-CM | POA: Diagnosis present

## 2014-01-06 DIAGNOSIS — R188 Other ascites: Secondary | ICD-10-CM

## 2014-01-06 DIAGNOSIS — K652 Spontaneous bacterial peritonitis: Secondary | ICD-10-CM

## 2014-01-06 DIAGNOSIS — G4733 Obstructive sleep apnea (adult) (pediatric): Secondary | ICD-10-CM

## 2014-01-06 DIAGNOSIS — I10 Essential (primary) hypertension: Secondary | ICD-10-CM

## 2014-01-06 DIAGNOSIS — K7010 Alcoholic hepatitis without ascites: Secondary | ICD-10-CM

## 2014-01-06 DIAGNOSIS — B1920 Unspecified viral hepatitis C without hepatic coma: Secondary | ICD-10-CM

## 2014-01-06 DIAGNOSIS — I252 Old myocardial infarction: Secondary | ICD-10-CM

## 2014-01-06 DIAGNOSIS — R918 Other nonspecific abnormal finding of lung field: Secondary | ICD-10-CM

## 2014-01-06 DIAGNOSIS — G894 Chronic pain syndrome: Secondary | ICD-10-CM | POA: Diagnosis present

## 2014-01-06 DIAGNOSIS — Z9981 Dependence on supplemental oxygen: Secondary | ICD-10-CM | POA: Diagnosis not present

## 2014-01-06 DIAGNOSIS — K219 Gastro-esophageal reflux disease without esophagitis: Secondary | ICD-10-CM | POA: Diagnosis present

## 2014-01-06 DIAGNOSIS — I8500 Esophageal varices without bleeding: Secondary | ICD-10-CM

## 2014-01-06 DIAGNOSIS — M129 Arthropathy, unspecified: Secondary | ICD-10-CM | POA: Diagnosis present

## 2014-01-06 DIAGNOSIS — Z9889 Other specified postprocedural states: Secondary | ICD-10-CM

## 2014-01-06 DIAGNOSIS — R404 Transient alteration of awareness: Secondary | ICD-10-CM

## 2014-01-06 DIAGNOSIS — K7031 Alcoholic cirrhosis of liver with ascites: Secondary | ICD-10-CM

## 2014-01-06 DIAGNOSIS — K7689 Other specified diseases of liver: Secondary | ICD-10-CM | POA: Diagnosis not present

## 2014-01-06 DIAGNOSIS — B1921 Unspecified viral hepatitis C with hepatic coma: Secondary | ICD-10-CM

## 2014-01-06 DIAGNOSIS — Z8673 Personal history of transient ischemic attack (TIA), and cerebral infarction without residual deficits: Secondary | ICD-10-CM | POA: Diagnosis not present

## 2014-01-06 DIAGNOSIS — M549 Dorsalgia, unspecified: Secondary | ICD-10-CM

## 2014-01-06 DIAGNOSIS — F411 Generalized anxiety disorder: Secondary | ICD-10-CM

## 2014-01-06 DIAGNOSIS — K7460 Unspecified cirrhosis of liver: Secondary | ICD-10-CM | POA: Diagnosis not present

## 2014-01-06 DIAGNOSIS — K5289 Other specified noninfective gastroenteritis and colitis: Secondary | ICD-10-CM | POA: Diagnosis present

## 2014-01-06 DIAGNOSIS — K7030 Alcoholic cirrhosis of liver without ascites: Secondary | ICD-10-CM | POA: Diagnosis present

## 2014-01-06 DIAGNOSIS — B182 Chronic viral hepatitis C: Secondary | ICD-10-CM

## 2014-01-06 DIAGNOSIS — R1011 Right upper quadrant pain: Secondary | ICD-10-CM

## 2014-01-06 DIAGNOSIS — K769 Liver disease, unspecified: Secondary | ICD-10-CM

## 2014-01-06 HISTORY — DX: Unspecified cirrhosis of liver: K74.60

## 2014-01-06 HISTORY — DX: Acute embolism and thrombosis of unspecified vein: I82.90

## 2014-01-06 HISTORY — DX: Esophageal varices without bleeding: I85.00

## 2014-01-06 HISTORY — DX: Other nonspecific abnormal finding of lung field: R91.8

## 2014-01-06 LAB — ETHANOL: ALCOHOL ETHYL (B): 68 mg/dL — AB (ref 0–11)

## 2014-01-06 LAB — CBC WITH DIFFERENTIAL/PLATELET
Basophils Absolute: 0 10*3/uL (ref 0.0–0.1)
Basophils Relative: 1 % (ref 0–1)
EOS PCT: 5 % (ref 0–5)
Eosinophils Absolute: 0.2 10*3/uL (ref 0.0–0.7)
HCT: 41.2 % (ref 39.0–52.0)
HEMOGLOBIN: 14.6 g/dL (ref 13.0–17.0)
LYMPHS ABS: 1.1 10*3/uL (ref 0.7–4.0)
LYMPHS PCT: 25 % (ref 12–46)
MCH: 31.8 pg (ref 26.0–34.0)
MCHC: 35.4 g/dL (ref 30.0–36.0)
MCV: 89.8 fL (ref 78.0–100.0)
MONO ABS: 0.5 10*3/uL (ref 0.1–1.0)
MONOS PCT: 11 % (ref 3–12)
Neutro Abs: 2.5 10*3/uL (ref 1.7–7.7)
Neutrophils Relative %: 59 % (ref 43–77)
Platelets: 72 10*3/uL — ABNORMAL LOW (ref 150–400)
RBC: 4.59 MIL/uL (ref 4.22–5.81)
RDW: 14.2 % (ref 11.5–15.5)
WBC: 4.3 10*3/uL (ref 4.0–10.5)

## 2014-01-06 LAB — COMPREHENSIVE METABOLIC PANEL
ALT: 32 U/L (ref 0–53)
ANION GAP: 10 (ref 5–15)
AST: 71 U/L — ABNORMAL HIGH (ref 0–37)
Albumin: 2.7 g/dL — ABNORMAL LOW (ref 3.5–5.2)
Alkaline Phosphatase: 164 U/L — ABNORMAL HIGH (ref 39–117)
BUN: 4 mg/dL — AB (ref 6–23)
CALCIUM: 8.5 mg/dL (ref 8.4–10.5)
CO2: 27 meq/L (ref 19–32)
CREATININE: 0.62 mg/dL (ref 0.50–1.35)
Chloride: 106 mEq/L (ref 96–112)
GLUCOSE: 81 mg/dL (ref 70–99)
Potassium: 4.4 mEq/L (ref 3.7–5.3)
Sodium: 143 mEq/L (ref 137–147)
Total Bilirubin: 2.7 mg/dL — ABNORMAL HIGH (ref 0.3–1.2)
Total Protein: 7 g/dL (ref 6.0–8.3)

## 2014-01-06 LAB — LIPASE, BLOOD: Lipase: 71 U/L — ABNORMAL HIGH (ref 11–59)

## 2014-01-06 LAB — PROTIME-INR
INR: 1.39 (ref 0.00–1.49)
PROTHROMBIN TIME: 17.1 s — AB (ref 11.6–15.2)

## 2014-01-06 MED ORDER — MELOXICAM 15 MG PO TABS
15.0000 mg | ORAL_TABLET | Freq: Every day | ORAL | Status: DC
Start: 2014-01-06 — End: 2014-01-06

## 2014-01-06 MED ORDER — IOHEXOL 300 MG/ML  SOLN
100.0000 mL | Freq: Once | INTRAMUSCULAR | Status: AC | PRN
  Administered 2014-01-06: 100 mL via INTRAVENOUS

## 2014-01-06 MED ORDER — LACTULOSE 10 GM/15ML PO SOLN
20.0000 g | Freq: Two times a day (BID) | ORAL | Status: DC
Start: 2014-01-06 — End: 2014-01-10
  Administered 2014-01-06 – 2014-01-10 (×7): 20 g via ORAL
  Filled 2014-01-06 (×7): qty 30

## 2014-01-06 MED ORDER — ONDANSETRON HCL 4 MG PO TABS: 4.0000 mg | ORAL_TABLET | Freq: Four times a day (QID) | ORAL | Status: DC | PRN

## 2014-01-06 MED ORDER — SODIUM CHLORIDE 0.9 % IV BOLUS (SEPSIS)
250.0000 mL | Freq: Once | INTRAVENOUS | Status: AC
  Administered 2014-01-06: 250 mL via INTRAVENOUS

## 2014-01-06 MED ORDER — ONDANSETRON HCL 4 MG/2ML IJ SOLN: 4.0000 mg | Freq: Four times a day (QID) | INTRAMUSCULAR | Status: DC | PRN

## 2014-01-06 MED ORDER — CEFTRIAXONE SODIUM 1 G IJ SOLR
1.0000 g | Freq: Once | INTRAMUSCULAR | Status: AC
  Administered 2014-01-06: 1 g via INTRAVENOUS
  Filled 2014-01-06: qty 10

## 2014-01-06 MED ORDER — SODIUM CHLORIDE 0.9 % IV SOLN
INTRAVENOUS | Status: DC
  Administered 2014-01-06: 16:00:00 via INTRAVENOUS

## 2014-01-06 MED ORDER — DEXTROSE 5 % IV SOLN
1.0000 g | INTRAVENOUS | Status: DC
  Filled 2014-01-06: qty 10

## 2014-01-06 MED ORDER — ONDANSETRON HCL 4 MG PO TABS: 4.0000 mg | ORAL_TABLET | Freq: Three times a day (TID) | ORAL | Status: DC | PRN

## 2014-01-06 MED ORDER — METOPROLOL TARTRATE 50 MG PO TABS
50.0000 mg | ORAL_TABLET | Freq: Two times a day (BID) | ORAL | Status: DC
  Administered 2014-01-06: 50 mg via ORAL
  Filled 2014-01-06 (×2): qty 1

## 2014-01-06 MED ORDER — INFLUENZA VAC SPLIT QUAD 0.5 ML IM SUSY
0.5000 mL | PREFILLED_SYRINGE | INTRAMUSCULAR | Status: DC
  Filled 2014-01-06: qty 0.5

## 2014-01-06 MED ORDER — HYDROMORPHONE HCL PF 1 MG/ML IJ SOLN
0.5000 mg | INTRAMUSCULAR | Status: AC | PRN
  Administered 2014-01-06 – 2014-01-07 (×2): 0.5 mg via INTRAVENOUS
  Filled 2014-01-06 (×2): qty 1

## 2014-01-06 MED ORDER — ONDANSETRON HCL 4 MG/2ML IJ SOLN
4.0000 mg | Freq: Once | INTRAMUSCULAR | Status: AC
  Administered 2014-01-06: 4 mg via INTRAVENOUS
  Filled 2014-01-06: qty 2

## 2014-01-06 MED ORDER — ALPRAZOLAM 0.5 MG PO TABS
0.5000 mg | ORAL_TABLET | Freq: Every evening | ORAL | Status: DC | PRN
  Administered 2014-01-06 – 2014-01-09 (×4): 0.5 mg via ORAL
  Filled 2014-01-06 (×4): qty 1

## 2014-01-06 MED ORDER — SODIUM CHLORIDE 0.9 % IJ SOLN
3.0000 mL | Freq: Two times a day (BID) | INTRAMUSCULAR | Status: DC
  Administered 2014-01-06 – 2014-01-10 (×6): 3 mL via INTRAVENOUS

## 2014-01-06 MED ORDER — HYDROMORPHONE HCL PF 1 MG/ML IJ SOLN
0.5000 mg | Freq: Once | INTRAMUSCULAR | Status: AC
  Administered 2014-01-06: 0.5 mg via INTRAVENOUS
  Filled 2014-01-06: qty 1

## 2014-01-06 MED ORDER — LORAZEPAM 2 MG/ML IJ SOLN
1.0000 mg | Freq: Once | INTRAMUSCULAR | Status: AC
  Administered 2014-01-06: 1 mg via INTRAVENOUS
  Filled 2014-01-06: qty 1

## 2014-01-06 MED ORDER — VANCOMYCIN HCL IN DEXTROSE 1-5 GM/200ML-% IV SOLN
1000.0000 mg | Freq: Once | INTRAVENOUS | Status: AC
  Administered 2014-01-06: 1000 mg via INTRAVENOUS
  Filled 2014-01-06: qty 200

## 2014-01-06 NOTE — ED Notes (Signed)
Pt reports leaking from paracentesis site x 1.5 weeks.  Pt reports abd pain in RUQ, described as sharp and constant, rated 10/10.  Pt reports nausea and diarrhea, but no vomiting.  Pt reports hx of seizures but reports taking his medication.   NAD, respirations equal and unlabored, skin warm and dry.

## 2014-01-06 NOTE — ED Provider Notes (Signed)
CSN: [id number]     Arrival date & time 01/06/14  1346 History  This chart was scribed for Darren Mulders, MD by Milly Jakob, ED Scribe. The patient was seen in room APA12/APA12. Patient's care was started at 2:51 PM.    Chief Complaint  Patient presents with  . Abdominal Pain   The history is provided by the patient. No language interpreter was used.   HPI Comments: Darren Abbott is a 58 y.o. male who presents to the Emergency Department complaining of persistant fluid leakage out of his stomach after having fluid drained from his stomach at [facility] 1.5 weeks ago. He reports that he also had his stomach drained 3.5 weeks ago.  He reports abdominal pain at the site of the draininage in the RLQ and in he ULQ that radiates into his back. He reports a history of similar symptoms 4 months ago when his stomach became distended and he had fluid drained for the first time.  GI: Dr. Hessie Diener.  Past Medical History  Diagnosis Date  . Malnutrition of moderate degree   . Altered mental status   . Unspecified essential hypertension   . Anxiety state, unspecified   . Transient ischemic attack (TIA), and cerebral infarction without residual deficits(V12.54)   . Body mass index between 19-24, adult   . [place]rial [place]     [place].  Marland Kitchen Heart murmur   . Obstructive sleep apnea (adult) (pediatric)     to start back after surgery- not used for 2-43months  . GERD (gastroesophageal reflux disease)   . Unspecified epilepsy without mention of intractable epilepsy 13    only once  . Hand laceration 80    end of all fingers on rt hand  . Old myocardial infarction   . Asthma     "outgrew it"  . Borderline diabetes   . 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 Acute alcoholic hepatitis     possible hepatic encephalopathy May 10, 2011  . Daily headache   . Migraine     "daily" (08/30/2013)  . Stroke ~ 2000    "mild"  . Arthritis     "I'm ate up w/it" (08/30/2013)  .  Chronic lower back pain   . On home oxygen therapy     "2L prn" (08/30/2013)  . Chronic kidney disease     "one isn't functioning like it should" (08/30/2013)   Past Surgical History  Procedure Laterality Date  . Clavicle surgery Left ~ 2008    "crushed it"  . Hand surgery Left     lacerations of thumb. reattached  . Nasal septum surgery  08/30/2013  . Rhinoplasty  08/30/2013  . Hand surgery Right 1980's    "all 4 fingers cut off and reattached"  . Foot fracture surgery Right   . Septoplasty N/A 08/30/2013    Procedure: SEPTOPLASTY;  Surgeon: Serena Colonel, MD;  Location: [facility] OR;  Service: ENT;  Laterality: N/A;  . Rhinoplasty N/A 08/30/2013    Procedure: RHINOPLASTY;  Surgeon: Serena Colonel, MD;  Location: [facility] OR;  Service: ENT;  Laterality: N/A;   History reviewed. No pertinent family history. History  Substance Use Topics  . Smoking status: Current Every Day Smoker -- 0.25 packs/day for 44 years    Types: Cigarettes  . Smokeless tobacco: Never Used     Comment: 08/30/2013 "was smoking 2 ppd; cut down to 4-5 cigarettes/day ~ 8 months ago"  . Alcohol Use: Yes  Comment: binges intermittently last 1-2 yrs    Review of Systems  Constitutional: Negative for fever and chills.  HENT: Negative for rhinorrhea and sore throat.   Eyes: Negative for visual disturbance.  Respiratory: Positive for cough. Negative for shortness of breath.   Cardiovascular: Positive for leg swelling. Negative for chest pain.  Gastrointestinal: Positive for nausea, vomiting and diarrhea. Negative for abdominal pain.  Genitourinary: Negative for dysuria.  Musculoskeletal: Positive for back pain. Negative for neck pain.  Skin: Negative for rash.  Neurological: Positive for seizures and headaches. Negative for dizziness and light-headedness.       Cramps where his fingers draw up.  Hematological: Bruises/bleeds easily.  Psychiatric/Behavioral: Negative for confusion.   Allergies  Review of patient's allergies indicates  no known allergies.  Home Medications   Prior to Admission medications   Medication Sig Start Date End Date Taking? Authorizing Provider  ALPRAZolam Prudy Feeler) 0.5 MG tablet Take 0.5 mg by mouth at bedtime as needed for sleep.   Yes Historical Provider, MD  cephALEXin (KEFLEX) 500 MG capsule Take 500 mg by mouth 4 (four) times daily.   Yes Historical Provider, MD  Lactulose 20 GM/30ML SOLN Take 30 mLs (20 g total) by mouth 2 (two) times daily. 01/01/14  Yes Deatra Canter, FNP  meloxicam (MOBIC) 15 MG tablet Take 1 tablet (15 mg total) by mouth daily. 10/24/13  Yes Deatra Canter, FNP  metoprolol (LOPRESSOR) 50 MG tablet Take 1 tablet (50 mg total) by mouth 2 (two) times daily. 09/09/13  Yes Deatra Canter, FNP  Multiple Vitamins-Minerals (CENTRUM SILVER PO) Take 1 tablet by mouth daily.   Yes Historical Provider, MD  ondansetron (ZOFRAN) 4 MG tablet Take 4 mg by mouth every 8 (eight) hours as needed for nausea or vomiting.   Yes Historical Provider, MD   Triage Vitals: BP 104/58  Pulse 70  Temp(Src) 98.2 F (36.8 C) (Oral)  Resp 18  Ht  (1.854 m)  Wt 150 lb (68.04 kg)  BMI 19.79 kg/m2  SpO2 99% Physical Exam  Nursing note and vitals reviewed. Constitutional: He is oriented to person, place, and time. He appears well-developed and well-nourished. No distress.  HENT:  Head: Normocephalic and atraumatic.  Mouth/Throat: Oropharynx is clear and moist.  Eyes: Conjunctivae and EOM are normal. Scleral icterus (minor) is present.  Neck: Neck supple. No tracheal deviation present.  Cardiovascular: Normal rate, regular rhythm and normal heart sounds.   No murmur heard. Pulmonary/Chest: Effort normal. No respiratory distress.  Abdominal: Soft. There is no guarding.  At his mid clavicular line all the way down to his abdomen, 1 finger breadth below his ribs there is a small hole with no fluid around it. Generalized tenderness, greater on the right side.  Musculoskeletal: Normal range of  motion. He exhibits no edema.  Neurological: He is alert and oriented to person, place, and time.  Skin: Skin is warm and dry.  Psychiatric: He has a normal mood and affect. His behavior is normal.    ED Course  Procedures (including critical care time) DIAGNOSTIC STUDIES: Oxygen Saturation is 99% on room air, normal by my interpretation.    COORDINATION OF CARE: 3:07 PM-Discussed treatment plan which includes lab work with pt at bedside and pt agreed to plan.   Labs Review Labs Reviewed  CBC WITH DIFFERENTIAL - Abnormal; Notable for the following:    Platelets 72 (*)    All other components within normal limits  COMPREHENSIVE METABOLIC PANEL - Abnormal;  Notable for the following:    BUN 4 (*)    Albumin 2.7 (*)    AST 71 (*)    Alkaline Phosphatase 164 (*)    Total Bilirubin 2.7 (*)    All other components within normal limits  LIPASE, BLOOD - Abnormal; Notable for the following:    Lipase 71 (*)    All other components within normal limits  ETHANOL - Abnormal; Notable for the following:    Alcohol, Ethyl (B) 68 (*)    All other components within normal limits  PROTIME-INR - Abnormal; Notable for the following:    Prothrombin Time 17.1 (*)    All other components within normal limits  COMPREHENSIVE METABOLIC PANEL  CBC  PROTIME-INR   Results for orders placed during the hospital encounter of 01/06/14  CBC WITH DIFFERENTIAL      Result Value Ref Range   WBC 4.3  4.0 - 10.5 K/uL   RBC 4.59  4.22 - 5.81 MIL/uL   Hemoglobin 14.6  13.0 - 17.0 g/dL   HCT 53.6  64.4 - 03.4 %   MCV 89.8  78.0 - 100.0 fL   MCH 31.8  26.0 - 34.0 pg   MCHC 35.4  30.0 - 36.0 g/dL   RDW 74.2  59.5 - 63.8 %   Platelets 72 (*) 150 - 400 K/uL   Neutrophils Relative % 59  43 - 77 %   Neutro Abs 2.5  1.7 - 7.7 K/uL   Lymphocytes Relative 25  12 - 46 %   Lymphs Abs 1.1  0.7 - 4.0 K/uL   Monocytes Relative 11  3 - 12 %   Monocytes Absolute 0.5  0.1 - 1.0 K/uL   Eosinophils Relative 5  0 - 5 %    Eosinophils Absolute 0.2  0.0 - 0.7 K/uL   Basophils Relative 1  0 - 1 %   Basophils Absolute 0.0  0.0 - 0.1 K/uL  COMPREHENSIVE METABOLIC PANEL      Result Value Ref Range   Sodium 143  137 - 147 mEq/L   Potassium 4.4  3.7 - 5.3 mEq/L   Chloride 106  96 - 112 mEq/L   CO2 27  19 - 32 mEq/L   Glucose, Bld 81  70 - 99 mg/dL   BUN 4 (*) 6 - 23 mg/dL   Creatinine, Ser 7.56  0.50 - 1.35 mg/dL   Calcium 8.5  8.4 - 43.3 mg/dL   Total Protein 7.0  6.0 - 8.3 g/dL   Albumin 2.7 (*) 3.5 - 5.2 g/dL   AST 71 (*) 0 - 37 U/L   ALT 32  0 - 53 U/L   Alkaline Phosphatase 164 (*) 39 - 117 U/L   Total Bilirubin 2.7 (*) 0.3 - 1.2 mg/dL   GFR calc non Af Amer >90  >90 mL/min   GFR calc Af Amer >90  >90 mL/min   Anion gap 10  5 - 15  LIPASE, BLOOD      Result Value Ref Range   Lipase 71 (*) 11 - 59 U/L  ETHANOL      Result Value Ref Range   Alcohol, Ethyl (B) 68 (*) 0 - 11 mg/dL  PROTIME-INR      Result Value Ref Range   Prothrombin Time 17.1 (*) 11.6 - 15.2 seconds   INR 1.39  0.00 - 1.49     Imaging Review Dg Chest Port 1 View  01/06/2014   CLINICAL DATA:  Shortness of  breath, smoker, hypertension, abdominal pain  EXAM: PORTABLE CHEST - 1 VIEW  COMPARISON:  12/17/2013  FINDINGS: Left clavicular hardware. Bilateral AC joint degenerative change noted. Heart size is normal. Curvilinear left lower lobe atelectasis or scarring. Lungs are hypoaerated with crowding of the bronchovascular markings. No focal pulmonary opacity otherwise. No pleural effusion.  IMPRESSION: Low lung volumes bilaterally with crowding of the bronchovascular markings and left lower lobe atelectasis.   Electronically Signed   By: Christiana Pellant M.D.   On: 01/06/2014 15:39   US Abdomen Limited Ruq  01/06/2014   CLINICAL DATA:  Ascites.  Cirrhosis and hepatitis.  EXAM: LIMITED ABDOMEN ULTRASOUND FOR ASCITES  TECHNIQUE: Limited ultrasound survey for ascites was performed in all four abdominal quadrants.  COMPARISON:  Multiple exams,  including 12/31/2013 and 12/30/2013  FINDINGS: Small amount of ascites noted in the right upper quadrant, but not enough to provide a therapeutic benefit if drained. Accordingly, paracentesis was not performed.  IMPRESSION: 1. Small amount of ascites in the right upper quadrant, not judged to provide a significant therapeutic benefit if drained.   Electronically Signed   By: Herbie Baltimore M.D.   On: 01/06/2014 16:42     EKG Interpretation None      MDM   Final diagnoses:  Spontaneous bacterial peritonitis    The patient clinically with the possibility of a bacterial peritonitis. Patient with alcohol related to ascites and cirrhosis. Patient had the abdomen tapped for ascites fluid at [facility] one half weeks ago. Patient can drink fluids since then. Patient states he's had fever and chills feeling at home and has abdominal pain predominantly around the leaking site but some also to the left side of the abdomen. Patient without significant bowel distention. Consulted urology for possible of tapping and fluid analysis. Ultrasound to showed no significant fluid pocket for them to. Clinically patient may have peritonitis so he was started on Rocephin which is standard the antibiotic but since this is an open draining area through and vancomycin just in case Aureus was more likely to be a possible pathogen. Patient nontoxic no acute distress. Patient does still drink every day. Patient given Ativan. Discussed with hospitalist they will admit and continue antibiotics probably going to get a box overnight they can be discharged home.  The patient's white blood cell count is not elevated also on exam not concerned about significant abdominal pathology or acute surgical abdomen. Main concern would be the for some mild peritonitis.     I personally performed the services described in this documentation, which was scribed in my presence. The recorded information has been reviewed and is  accurate.      Darren Mulders, MD 01/07/14 [phone]

## 2014-01-06 NOTE — H&P (Signed)
[facility] History and Physical  Darren Abbott ZOX:[id number] DOB: 1955-07-27 DOA: 01/06/2014  Referring physician: ER PCP: No PCP Per Patient   Chief Complaint: Abdominal pain  HPI: Darren Abbott is a 58 y.o. male  This is a 58 year old man who presents with abdominal pain. He has had persistent fluid leakage out of his abdomen after having ascites drained at [facility] approximately 10 days ago. He apparently has alcoholic related liver problems and appears to have recurrent ascites. I can only presume that he probably has cirrhosis and has decompensation. Unfortunately, he continues to drink alcohol. He was due to see a gastroenterologist but has not seen one yet. He now comes in with abdominal pain. He has no fever, nausea or vomiting.   Review of Systems:  Constitutional:  No weight loss, night sweats, Fevers, chills, fatigue.  HEENT:  No headaches, Difficulty swallowing,Tooth/dental problems,Sore throat,  No sneezing, itching, ear ache, nasal congestion, post nasal drip,  Cardio-vascular:  No chest pain, Orthopnea, PND, swelling in lower extremities, anasarca, dizziness, palpitations   No shortness of breath with exertion or at rest. No excess mucus, no productive cough, No non-productive cough, No coughing up of blood.No change in color of mucus.No wheezing.No chest wall deformity  Skin:  no rash or lesions.  GU:  no dysuria, change in color of urine, no urgency or frequency. No flank pain.  Musculoskeletal:  No joint pain or swelling. No decreased range of motion. No back pain.  Psych:  No change in mood or affect. No depression or anxiety. No memory loss.   Past Medical History  Diagnosis Date  . Malnutrition of moderate degree   . Altered mental status   . Unspecified essential hypertension   . Anxiety state, unspecified   . Transient ischemic attack (TIA), and cerebral infarction without residual deficits(V12.54)   . Body mass index between 19-24,  adult   . [place]     [place]  Marland Kitchen Heart murmur   . Obstructive sleep apnea (adult) (pediatric)     to start back after surgery- not used for 2-48months  . GERD (gastroesophageal reflux disease)   . Unspecified epilepsy without mention of intractable epilepsy 13    only once  . Hand laceration 80    end of all fingers on rt hand  . Old myocardial infarction   . Asthma     "outgrew it"  . Borderline diabetes   . 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 Acute alcoholic hepatitis     possible hepatic encephalopathy May 10, 2011  . Daily headache   . Migraine     "daily" (08/30/2013)  . Stroke ~ 2000    "mild"  . Arthritis     "I'm ate up w/it" (08/30/2013)  . Chronic lower back pain   . On home oxygen therapy     "2L prn" (08/30/2013)  . Chronic kidney disease     "one isn't functioning like it should" (08/30/2013)   Past Surgical History  Procedure Laterality Date  . Clavicle surgery Left ~ 2008    "crushed it"  . Hand surgery Left     lacerations of thumb. reattached  . Nasal septum surgery  08/30/2013  . Rhinoplasty  08/30/2013  . Hand surgery Right 1980's    "all 4 fingers cut off and reattached"  . Foot fracture surgery Right   . Septoplasty N/A 08/30/2013    Procedure: SEPTOPLASTY;  Surgeon: Serena Colonel, MD;  Location: [facility] OR;  Service: ENT;  Laterality: N/A;  . Rhinoplasty N/A 08/30/2013    Procedure: RHINOPLASTY;  Surgeon: Serena Colonel, MD;  Location: [facility] OR;  Service: ENT;  Laterality: N/A;   Social History:  reports that he has been smoking Cigarettes.  He has a 11 pack-year smoking history. He has never used smokeless tobacco. He reports that he drinks alcohol. He reports that he does not use illicit drugs.  No Known Allergies  History reviewed. No pertinent family history.   Prior to Admission medications   Medication Sig Start Date End Date Taking? Authorizing Provider  ALPRAZolam Prudy Feeler) 0.5 MG tablet Take 0.5 mg by mouth at  bedtime as needed for sleep.   Yes Historical Provider, MD  cephALEXin (KEFLEX) 500 MG capsule Take 500 mg by mouth 4 (four) times daily.   Yes Historical Provider, MD  Lactulose 20 GM/30ML SOLN Take 30 mLs (20 g total) by mouth 2 (two) times daily. 01/01/14  Yes Deatra Canter, FNP  meloxicam (MOBIC) 15 MG tablet Take 1 tablet (15 mg total) by mouth daily. 10/24/13  Yes Deatra Canter, FNP  metoprolol (LOPRESSOR) 50 MG tablet Take 1 tablet (50 mg total) by mouth 2 (two) times daily. 09/09/13  Yes Deatra Canter, FNP  Multiple Vitamins-Minerals (CENTRUM SILVER PO) Take 1 tablet by mouth daily.   Yes Historical Provider, MD  ondansetron (ZOFRAN) 4 MG tablet Take 4 mg by mouth every 8 (eight) hours as needed for nausea or vomiting.   Yes Historical Provider, MD   Physical Exam: Filed Vitals:   01/06/14 1400 01/06/14 1726 01/06/14 1800  BP: 104/58 94/63 98/73   Pulse: 70 71 71  Temp: 98.2 F (36.8 C)    TempSrc: Oral    Resp: 18 16   Height:  (1.854 m)    Weight: 68.04 kg (150 lb)    SpO2: 99% 92% 94%    Wt Readings from Last 3 Encounters:  01/06/14 68.04 kg (150 lb)  01/01/14 68.04 kg (150 lb)  11/26/13 76.204 kg (168 lb)    General:  Appears calm and comfortable. Looks mildly jaundice. Eyes: PERRL, normal lids, irises & conjunctiva ENT: grossly normal hearing, lips & tongue Neck: no LAD, masses or thyromegaly Cardiovascular: RRR, no m/r/g. No LE edema. Telemetry: SR, no arrhythmias  Respiratory: CTA bilaterally, no w/r/r. Normal respiratory effort. Abdomen: soft, ntnd. Somewhat distended. There is a puncture site in the right upper quadrant without inflammation around it. Skin: no rash or induration seen on limited exam Musculoskeletal: grossly normal tone BUE/BLE Psychiatric: grossly normal mood and affect, speech fluent and appropriate Neurologic: grossly non-focal.alert and orientated. No evidence of hepatic encephalopathy.           Labs on Admission:  Basic  Metabolic Panel:  Recent Labs Lab 01/06/14 1518  NA 143  K 4.4  CL 106  CO2 27  GLUCOSE 81  BUN 4*  CREATININE 0.62  CALCIUM 8.5   Liver Function Tests:  Recent Labs Lab 01/06/14 1518  AST 71*  ALT 32  ALKPHOS 164*  BILITOT 2.7*  PROT 7.0  ALBUMIN 2.7*    Recent Labs Lab 01/06/14 1518  LIPASE 71*   No results found for this basename: AMMONIA,  in the last 168 hours CBC:  Recent Labs Lab 01/06/14 1518  WBC 4.3  NEUTROABS 2.5  HGB 14.6  HCT 41.2  MCV 89.8  PLT 72*   Cardiac Enzymes: No results found for this basename: CKTOTAL, CKMB, CKMBINDEX,  TROPONINI,  in the last 168 hours  BNP (last 3 results) No results found for this basename: PROBNP,  in the last 8760 hours CBG: No results found for this basename: GLUCAP,  in the last 168 hours  Radiological Exams on Admission: Dg Chest Port 1 View  01/06/2014   CLINICAL DATA:  Shortness of breath, smoker, hypertension, abdominal pain  EXAM: PORTABLE CHEST - 1 VIEW  COMPARISON:  12/17/2013  FINDINGS: Left clavicular hardware. Bilateral AC joint degenerative change noted. Heart size is normal. Curvilinear left lower lobe atelectasis or scarring. Lungs are hypoaerated with crowding of the bronchovascular markings. No focal pulmonary opacity otherwise. No pleural effusion.  IMPRESSION: Low lung volumes bilaterally with crowding of the bronchovascular markings and left lower lobe atelectasis.   Electronically Signed   By: Christiana Pellant M.D.   On: 01/06/2014 15:39   US Abdomen Limited Ruq  01/06/2014   CLINICAL DATA:  Ascites.  Cirrhosis and hepatitis.  EXAM: LIMITED ABDOMEN ULTRASOUND FOR ASCITES  TECHNIQUE: Limited ultrasound survey for ascites was performed in all four abdominal quadrants.  COMPARISON:  Multiple exams, including 12/31/2013 and 12/30/2013  FINDINGS: Small amount of ascites noted in the right upper quadrant, but not enough to provide a therapeutic benefit if drained. Accordingly, paracentesis was not  performed.  IMPRESSION: 1. Small amount of ascites in the right upper quadrant, not judged to provide a significant therapeutic benefit if drained.   Electronically Signed   By: Herbie Baltimore M.D.   On: 01/06/2014 16:42      Assessment/Plan   1. Abdominal pain, unclear etiology. White blood cell count is not elevated. Abdomen is soft nontender without any evidence of an acute abdomen clinically. Ultrasound does not show massive ascites and not enough to have any diagnostic paracentesis. 2. Ongoing alcoholism. No evidence of alcohol withdrawal. 3. Hypertension. 4. History of epilepsy.  Plan: 1. Admit to medical floor. 2. Empirical intravenous antibiotics. 3. CT scan of the abdomen for further evaluation of his abdominal pain. 4. Gastroenterology consultation.  Further recommendations will depend on patient's hospital progress.  Code Status: Full code.   DVT Prophylaxis: SCDs.  Family Communication: I discussed the plan with patient at the bedside.   Disposition Plan: Home when medically stable.   Time spent: 60 minutes.  Wilson Singer [facility] Pager [phone].  **Disclaimer: This note may have been dictated with voice recognition software. Similar sounding words can inadvertently be transcribed and this note may contain transcription errors which may not have been corrected upon publication of note.**

## 2014-01-06 NOTE — ED Notes (Addendum)
Clear liquid draining from rt abd, at site of paracentesis.    Procedure done 1.5 weeks ago at [facility].  Diarrhea. And frequent urination.  Says he had 2 beers today.

## 2014-01-07 ENCOUNTER — Encounter (HOSPITAL_COMMUNITY): Payer: Self-pay | Admitting: Gastroenterology

## 2014-01-07 DIAGNOSIS — B1920 Unspecified viral hepatitis C without hepatic coma: Secondary | ICD-10-CM | POA: Diagnosis present

## 2014-01-07 DIAGNOSIS — K7010 Alcoholic hepatitis without ascites: Secondary | ICD-10-CM

## 2014-01-07 DIAGNOSIS — I8290 Acute embolism and thrombosis of unspecified vein: Secondary | ICD-10-CM | POA: Diagnosis present

## 2014-01-07 LAB — CBC
HCT: 38.8 % — ABNORMAL LOW (ref 39.0–52.0)
Hemoglobin: 13.5 g/dL (ref 13.0–17.0)
MCH: 31.5 pg (ref 26.0–34.0)
MCHC: 34.8 g/dL (ref 30.0–36.0)
MCV: 90.4 fL (ref 78.0–100.0)
Platelets: 58 10*3/uL — ABNORMAL LOW (ref 150–400)
RBC: 4.29 MIL/uL (ref 4.22–5.81)
RDW: 14.3 % (ref 11.5–15.5)
WBC: 5.1 10*3/uL (ref 4.0–10.5)

## 2014-01-07 LAB — COMPREHENSIVE METABOLIC PANEL
ALT: 23 U/L (ref 0–53)
AST: 49 U/L — ABNORMAL HIGH (ref 0–37)
Albumin: 2.1 g/dL — ABNORMAL LOW (ref 3.5–5.2)
Alkaline Phosphatase: 134 U/L — ABNORMAL HIGH (ref 39–117)
Anion gap: 8 (ref 5–15)
BILIRUBIN TOTAL: 2.4 mg/dL — AB (ref 0.3–1.2)
BUN: 5 mg/dL — ABNORMAL LOW (ref 6–23)
CO2: 24 meq/L (ref 19–32)
Calcium: 7.6 mg/dL — ABNORMAL LOW (ref 8.4–10.5)
Chloride: 108 mEq/L (ref 96–112)
Creatinine, Ser: 0.69 mg/dL (ref 0.50–1.35)
GFR calc Af Amer: >90 mL/min (ref >90)
GFR calc non Af Amer: >90 mL/min (ref >90)
GLUCOSE: 102 mg/dL — AB (ref 70–99)
POTASSIUM: 4.1 meq/L (ref 3.7–5.3)
SODIUM: 140 meq/L (ref 137–147)
Total Protein: 5.5 g/dL — ABNORMAL LOW (ref 6.0–8.3)

## 2014-01-07 LAB — PROTIME-INR
INR: 1.53 — ABNORMAL HIGH (ref 0.00–1.49)
Prothrombin Time: 18.4 seconds — ABNORMAL HIGH (ref 11.6–15.2)

## 2014-01-07 MED ORDER — PANTOPRAZOLE SODIUM 40 MG PO TBEC
40.0000 mg | DELAYED_RELEASE_TABLET | Freq: Two times a day (BID) | ORAL | Status: DC
  Administered 2014-01-08 – 2014-01-10 (×5): 40 mg via ORAL
  Filled 2014-01-07 (×5): qty 1

## 2014-01-07 MED ORDER — VITAMIN B-1 100 MG PO TABS
100.0000 mg | ORAL_TABLET | Freq: Every day | ORAL | Status: DC
  Administered 2014-01-07 – 2014-01-10 (×3): 100 mg via ORAL
  Filled 2014-01-07 (×3): qty 1

## 2014-01-07 MED ORDER — FOLIC ACID 1 MG PO TABS
1.0000 mg | ORAL_TABLET | Freq: Every day | ORAL | Status: DC
  Administered 2014-01-07 – 2014-01-10 (×3): 1 mg via ORAL
  Filled 2014-01-07 (×3): qty 1

## 2014-01-07 MED ORDER — LORAZEPAM 1 MG PO TABS
1.0000 mg | ORAL_TABLET | Freq: Four times a day (QID) | ORAL | Status: AC | PRN
  Administered 2014-01-07 – 2014-01-10 (×2): 1 mg via ORAL
  Filled 2014-01-07 (×2): qty 1

## 2014-01-07 MED ORDER — METOPROLOL TARTRATE 25 MG PO TABS: 25.0000 mg | ORAL_TABLET | Freq: Two times a day (BID) | ORAL | Status: DC

## 2014-01-07 MED ORDER — THIAMINE HCL 100 MG/ML IJ SOLN: 100.0000 mg | Freq: Every day | INTRAMUSCULAR | Status: DC

## 2014-01-07 MED ORDER — ADULT MULTIVITAMIN W/MINERALS CH
1.0000 | ORAL_TABLET | Freq: Every day | ORAL | Status: DC
  Administered 2014-01-07 – 2014-01-10 (×3): 1 via ORAL
  Filled 2014-01-07 (×3): qty 1

## 2014-01-07 MED ORDER — LORAZEPAM 2 MG/ML IJ SOLN: 1.0000 mg | Freq: Four times a day (QID) | INTRAMUSCULAR | Status: AC | PRN

## 2014-01-07 MED ORDER — OXAZEPAM 15 MG PO CAPS
15.0000 mg | ORAL_CAPSULE | Freq: Three times a day (TID) | ORAL | Status: DC | PRN
  Administered 2014-01-08 – 2014-01-09 (×4): 15 mg via ORAL
  Filled 2014-01-07 (×4): qty 1

## 2014-01-07 MED ORDER — PANTOPRAZOLE SODIUM 40 MG PO TBEC
40.0000 mg | DELAYED_RELEASE_TABLET | Freq: Every day | ORAL | Status: DC
  Administered 2014-01-07: 40 mg via ORAL
  Filled 2014-01-07: qty 1

## 2014-01-07 MED ORDER — DEXTROSE 5 % IV SOLN
2.0000 g | Freq: Four times a day (QID) | INTRAVENOUS | Status: AC
  Administered 2014-01-07 – 2014-01-09 (×7): 2 g via INTRAVENOUS
  Filled 2014-01-07 (×8): qty 2

## 2014-01-07 NOTE — Progress Notes (Signed)
[facility] PROGRESS NOTE  Darren Abbott ZOX:[id number] DOB: July 07, 1955 DOA: 01/06/2014 PCP: No PCP Per Patient  Assessment/Plan: 1. Abdominal pain, unclear etiology. Some improvement this am.  White blood cell count is not elevated. Abdomen is soft nontender without any evidence of an acute abdomen clinically. Ultrasound does not show massive ascites and not enough to have any diagnostic paracentesis. Continue empiric antibiotics.  Evaluated by GI who opine pain has chronic component. Will continue pain meds.  2. Ongoing alcoholism. Will use CIWA protocol.  No evidence of alcohol withdrawal. 3. Hypertension. Some what soft today. Home meds include metoprolol 50 BID. Will decrease dose. monitor 4. History of epilepsy. No seizure activity. No meds noted.  5. Hep C: per GI note unknown genotype viral load. GI to follow 6. Colitis: question on CT. No diarrhea 7. 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  HPI/Subjective: Awake reports pain better  Objective: Filed Vitals:   01/07/14 0528  BP: 89/52  Pulse: 77  Temp: 99 F (37.2 C)  Resp: 13    Intake/Output Summary (Last 24 hours) at 01/07/14 1201 Last data filed at 01/07/14 1000  Gross per 24 hour  Intake      0 ml  Output    250 ml  Net   -250 ml   Filed Weights   01/06/14 1400 01/06/14 2028  Weight: 68.04 kg (150 lb) 71.668 kg (158 lb)    Exam:   General:  Appears comfortable  Cardiovascular: RRR No MGR No LE edema  Respiratory: normal effort BS clear bilaterally no wheeze  Abdomen: rounded but somewhat soft mild tenderness to palpation. +BS. Dressing right quadrant dry and intact  Musculoskeletal: no clubbing no cyanosis   Data Reviewed: Basic Metabolic Panel:  Recent Labs Lab 01/06/14 1518  01/07/14 0539  NA 143 140  K 4.4 4.1  CL 106 108  CO2 27 24  GLUCOSE 81 102*  BUN 4* 5*  CREATININE 0.62 0.69  CALCIUM 8.5 7.6*   Liver Function Tests:  Recent Labs Lab 01/06/14 1518 01/07/14 0539  AST 71* 49*  ALT 32 23  ALKPHOS 164* 134*  BILITOT 2.7* 2.4*  PROT 7.0 5.5*  ALBUMIN 2.7* 2.1*    Recent Labs Lab 01/06/14 1518  LIPASE 71*   No results found for this basename: AMMONIA,  in the last 168 hours CBC:  Recent Labs Lab 01/06/14 1518 01/07/14 0539  WBC 4.3 5.1  NEUTROABS 2.5  --   HGB 14.6 13.5  HCT 41.2 38.8*  MCV 89.8 90.4  PLT 72* 58*   Cardiac Enzymes: No results found for this basename: CKTOTAL, CKMB, CKMBINDEX, TROPONINI,  in the last 168 hours BNP (last 3 results) No results found for this basename: PROBNP,  in the last 8760 hours CBG: No results found for this basename: GLUCAP,  in the last 168 hours  No results found for this or any previous visit (from the past 240 hour(s)).   Studies: Ct Abdomen Pelvis W Contrast  01/06/2014   CLINICAL DATA:  Abdominal pain right upper quadrant. Paracentesis 1-1/2 weeks ago.  EXAM: CT ABDOMEN AND PELVIS WITH CONTRAST  TECHNIQUE: Multidetector CT imaging of the abdomen and pelvis was performed using the standard protocol following bolus administration of intravenous contrast.  CONTRAST:  OMNIPAQUE IOHEXOL 300  MG/ML  SOLN  COMPARISON:  12/30/2013, 11/18/2013 and 09/14/2008  FINDINGS: Lung bases are unremarkable.  Abdominal images demonstrate a small liver with nodular contour or unchanged. There is mild splenomegaly. Findings are compatible with cirrhosis. There is no change in chronic thrombus over the junction of the splenic vein to portal vein and throughout the main portal vein and left portal vein. There is moderate ascites with slight interval improvement. Gallbladder is contracted. The pancreas and adrenal glands are within normal. There are a few small nonspecific lymph nodes over the upper abdomen  and periaortic region without significant change. Kidneys normal in size without hydronephrosis or nephrolithiasis. There is a sub cm hypodensity over the upper pole of the right kidney unchanged and likely a cyst, but too small to characterize. There is calcified plaque over the abdominal aorta and iliac arteries. Appendix is within normal.  There is mild prominence of several proximal jejunal loops without significant change. There is diverticulosis of the colon. There is wall thickening of the ascending colon which may be related to patient's liver failure and hypoproteinemia, although cannot exclude an acute colitis.  Pelvic images demonstrate a normal rectum, bladder and prostate. Stable L3 and L4 compression fractures. Degenerative changes of the spine and hips.  IMPRESSION: Continued evidence of cirrhosis with portal venous hypertension and moderate ascites. Ascites is slightly improved compared to the previous exam. 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  Mild wall thickening of the ascending colon which may be secondary to patient's cirrhosis with hypoproteinemia, although cannot exclude an acute colitis.  Chronic prominence of the proximal jejunal loops.  Stable the L3 and L4 compression fractures.   Electronically Signed   By: Elberta Fortis M.D.   On: 01/06/2014 20:05   Dg Chest Port 1 View  01/06/2014   CLINICAL DATA:  Shortness of breath, smoker, hypertension, abdominal pain  EXAM: PORTABLE CHEST - 1 VIEW  COMPARISON:  12/17/2013  FINDINGS: Left clavicular hardware. Bilateral AC joint degenerative change noted. Heart size is normal. Curvilinear left lower lobe atelectasis or scarring. Lungs are hypoaerated with crowding of the bronchovascular markings. No focal pulmonary opacity otherwise. No pleural effusion.  IMPRESSION: Low lung volumes bilaterally with crowding of the bronchovascular markings and left lower lobe  atelectasis.   Electronically Signed   By: Christiana Pellant M.D.   On: 01/06/2014 15:39   US Abdomen Limited Ruq  01/06/2014   CLINICAL DATA:  Ascites.  Cirrhosis and hepatitis.  EXAM: LIMITED ABDOMEN ULTRASOUND FOR ASCITES  TECHNIQUE: Limited ultrasound survey for ascites was performed in all four abdominal quadrants.  COMPARISON:  Multiple exams, including 12/31/2013 and 12/30/2013  FINDINGS: Small amount of ascites noted in the right upper quadrant, but not enough to provide a therapeutic benefit if drained. Accordingly, paracentesis was not performed.  IMPRESSION: 1. Small amount of ascites in the right upper quadrant, not judged to provide a significant therapeutic benefit if drained.   Electronically Signed   By: Herbie Baltimore M.D.   On: 01/06/2014 16:42    Scheduled Meds: . cefTRIAXone (ROCEPHIN)  IV  1 g Intravenous Q24H  . Influenza vac split quadrivalent PF  0.5 mL Intramuscular Tomorrow-1000  . lactulose  20 g Oral BID  . metoprolol  25 mg Oral BID  . pantoprazole  40 mg Oral Daily  . sodium chloride  3 mL Intravenous Q12H   Continuous Infusions: . sodium chloride 50 mL/hr at  01/07/14 1610    Active Problems:   Atrial fibrillation   Unspecified epilepsy without mention of intractable epilepsy   Unspecified essential hypertension   Abdominal pain   Ascites   Hepatitis C   Thrombus    Time spent: 35 minutes    [facility]  [facility] Pager [phone]. If 7PM-7AM, please contact night-coverage at [email or web address], password [facility] 01/07/2014, 12:01 PM  LOS: 1 day

## 2014-01-07 NOTE — Progress Notes (Signed)
Patient seen and examined. Above note reviewed.  He has been admitted with abdominal pain. Patient has history of alcoholic/hep C cirrhosis. He recently had a paracentesis done at [facility] approximately 10 days ago. Reports abdominal pain since that time. Does not have any fever or leukocytosis. He's been followed by gastroenterology. Currently on intravenous antibiotics for possible SBP. He is on Ativan per CIWA protocol. Continue current treatments  Sriman Tally

## 2014-01-07 NOTE — Consult Note (Signed)
Referring Provider: Dr. Karilyn Cota Primary Care Physician:  Nils Pyle, FNP Primary Gastroenterologist:  Dr. Darrick Penna   Date of Admission: 01/06/14 Date of Consultation: 01/07/14  Reason for Consultation:  Cirrhosis  HPI:  Darren Abbott is a 58 year old male with history of cirrhosis, ETOH abuse, Hepatitis C with unknown genotype and viral load (treatment naive). Per admission documentation, appears he was seen at [facility] recently with paracentesis. I do not have outside records to review at this time. Poor historian. States 2 weeks of abdominal pain located at multiple sites to include epigastric, LLQ, RUQ. Also complaining of right flank pain. Admits to hurting "all over all the time". No fever, chills. Loose stools X 3 yesterday otherwise without diarrhea. No rectal bleeding, N/V. Notes reflux exacerbation for last several days. No PPI as outpatient. No melena, hematochezia.   Last colonoscopy at [facility] several years ago per patient. Last EGD in [place] possibly several months ago. States he had some type of esophageal surgery recently in [place] 4-5 months ago. Binge drinks. Presented to [facility] due to leakage from paracentesis site, abdominal pain.    Past Medical History  Diagnosis Date  . Malnutrition of moderate degree   . Altered mental status   . Unspecified essential hypertension   . Anxiety state, unspecified   . Transient ischemic attack (TIA), and cerebral infarction without residual deficits(V12.54)   . Body mass index between 19-24, adult   . Atrial fibrillation     episode at [place].  Marland Kitchen Heart murmur   . Obstructive sleep apnea (adult) (pediatric)     to start back after surgery- not used for 2-42months  . GERD (gastroesophageal reflux disease)   . Unspecified epilepsy without mention of intractable epilepsy 13    only once  . Hand laceration 80    end of all fingers on rt hand  . Old myocardial infarction   . Asthma     "outgrew it"  . Borderline  diabetes   . 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 Acute alcoholic hepatitis     possible hepatic encephalopathy May 10, 2011  . Daily headache   . Migraine     "daily" (08/30/2013)  . Stroke ~ 2000    "mild"  . Arthritis     "I'm ate up w/it" (08/30/2013)  . Chronic lower back pain   . On home oxygen therapy     "2L prn" (08/30/2013)  . Chronic kidney disease     "one isn't functioning like it should" (08/30/2013)    Past Surgical History  Procedure Laterality Date  . Clavicle surgery Left ~ 2008    "crushed it"  . Hand surgery Left     lacerations of thumb. reattached  . Nasal septum surgery  08/30/2013  . Rhinoplasty  08/30/2013  . Hand surgery Right 1980's    "all 4 fingers cut off and reattached"  . Foot fracture surgery Right   . Septoplasty N/A 08/30/2013    Procedure: SEPTOPLASTY;  Surgeon: Serena Colonel, MD;  Location: [facility] OR;  Service: ENT;  Laterality: N/A;  . Rhinoplasty N/A 08/30/2013    Procedure: RHINOPLASTY;  Surgeon: Serena Colonel, MD;  Location: [facility] OR;  Service: ENT;  Laterality: N/A;    Prior to Admission medications   Medication Sig Start Date End Date Taking? Authorizing Provider  ALPRAZolam Prudy Feeler) 0.5 MG tablet Take 0.5 mg by mouth at bedtime as needed for sleep.   Yes Historical  Provider, MD  cephALEXin (KEFLEX) 500 MG capsule Take 500 mg by mouth 4 (four) times daily.   Yes Historical Provider, MD  Lactulose 20 GM/30ML SOLN Take 30 mLs (20 g total) by mouth 2 (two) times daily. 01/01/14  Yes Deatra Canter, FNP  meloxicam (MOBIC) 15 MG tablet Take 1 tablet (15 mg total) by mouth daily. 10/24/13  Yes Deatra Canter, FNP  metoprolol (LOPRESSOR) 50 MG tablet Take 1 tablet (50 mg total) by mouth 2 (two) times daily. 09/09/13  Yes Deatra Canter, FNP  Multiple Vitamins-Minerals (CENTRUM SILVER PO) Take 1 tablet by mouth daily.   Yes Historical Provider, MD  ondansetron (ZOFRAN) 4 MG tablet Take 4 mg by mouth every 8 (eight) hours as  needed for nausea or vomiting.   Yes Historical Provider, MD    Current Facility-Administered Medications  Medication Dose Route Frequency Provider Last Rate Last Dose  . 0.9 %  sodium chloride infusion   Intravenous Continuous Gwenyth Bender, NP 50 mL/hr at 01/07/14 1610    . ALPRAZolam Prudy Feeler) tablet 0.5 mg  0.5 mg Oral QHS PRN Wilson Singer, MD   0.5 mg at 01/06/14 2332  . cefTRIAXone (ROCEPHIN) 1 g in dextrose 5 % 50 mL IVPB  1 g Intravenous Q24H Nimish C Gosrani, MD      . Influenza vac split quadrivalent PF (FLUARIX) injection 0.5 mL  0.5 mL Intramuscular Tomorrow-1000 Nimish C Gosrani, MD      . lactulose (CHRONULAC) 10 GM/15ML solution 20 g  20 g Oral BID Wilson Singer, MD   20 g at 01/07/14 0926  . metoprolol (LOPRESSOR) tablet 50 mg  50 mg Oral BID Wilson Singer, MD   50 mg at 01/06/14 2332  . ondansetron (ZOFRAN) tablet 4 mg  4 mg Oral Q6H PRN Nimish Normajean Glasgow, MD       Or  . ondansetron (ZOFRAN) injection 4 mg  4 mg Intravenous Q6H PRN Nimish C Gosrani, MD      . ondansetron (ZOFRAN) tablet 4 mg  4 mg Oral Q8H PRN Nimish C Gosrani, MD      . sodium chloride 0.9 % injection 3 mL  3 mL Intravenous Q12H Nimish C Gosrani, MD   3 mL at 01/06/14 2331    Allergies as of 01/06/2014  . (No Known Allergies)    Family History  Problem Relation Age of Onset  . Colon cancer Neg Hx     History   Social History  . Marital Status: Single    Spouse Name: N/A    Number of Children: N/A  . Years of Education: N/A   Occupational History  . Not on file.   Social History Main Topics  . Smoking status: Current Every Day Smoker -- 0.25 packs/day for 44 years    Types: Cigarettes  . Smokeless tobacco: Never Used     Comment: 08/30/2013 "was smoking 2 ppd; cut down to 4-5 cigarettes/day ~ 8 months ago"  . Alcohol Use: Yes     Comment: binges intermittently last 1-2 yrs  . Drug Use: No  . Sexual Activity: Yes   Other Topics Concern  . Not on file   Social History Narrative   . No narrative on file    Review of Systems: As mentioned in HPI  Physical Exam: Vital signs in last 24 hours: Temp:  [98.2 F (36.8 C)-99 F (37.2 C)] 99 F (37.2 C) (09/15 0528) Pulse Rate:  [70-84] 77 (09/15  1610) Resp:  [13-18] 13 (09/15 0528) BP: (89-128)/(52-85) 89/52 mmHg (09/15 0528) SpO2:  [91 %-99 %] 93 % (09/15 0528) Weight:  [150 lb (68.04 kg)-158 lb (71.668 kg)] 158 lb (71.668 kg) (09/14 2028) Last BM Date: 01/07/14 (well formed) General:   Alert,  [place], appears older than stated age Head:  Normocephalic and atraumatic. Eyes:  Mild icterus Ears:  Normal auditory acuity. Nose:  No deformity, discharge,  or lesions. Mouth:  No deformity or lesions, dentures present Lungs:  Abbott throughout to auscultation.   No wheezes, crackles, or rhonchi. No acute distress. Heart:  S1 S2 present without murmuts Abdomen:  Soft, rounded, TTP mainly left-sided abdomen with fullness noted, dressing intact at site of previous paracentesis site Rectal:  Deferred   Msk:  Symmetrical without gross deformities. Normal posture. Extremities:  Without  edema. Neurologic:  Alert and  oriented x4;  grossly normal neurologically. Skin:  Intact without significant lesions or rashes. Psych:  Alert and cooperative. Normal mood and affect.  Intake/Output from previous day:   Intake/Output this shift: Total I/O In: -  Out: 250 [Urine:250]  Lab Results:  Recent Labs  01/06/14 1518 01/07/14 0539  WBC 4.3 5.1  HGB 14.6 13.5  HCT 41.2 38.8*  PLT 72* 58*   BMET  Recent Labs  01/06/14 1518 01/07/14 0539  NA 143 140  K 4.4 4.1  CL 106 108  CO2 27 24  GLUCOSE 81 102*  BUN 4* 5*  CREATININE 0.62 0.69  CALCIUM 8.5 7.6*   LFT  Recent Labs  01/06/14 1518 01/07/14 0539  PROT 7.0 5.5*  ALBUMIN 2.7* 2.1*  AST 71* 49*  ALT 32 23  ALKPHOS 164* 134*  BILITOT 2.7* 2.4*   PT/INR  Recent Labs  01/06/14 1519 01/07/14 0539  LABPROT 17.1* 18.4*  INR 1.39 1.53*    Discriminant Function: 33 (with PT control of 11.6. 17 with PT control 15).   MELD 11  Studies/Results: Ct Abdomen Pelvis W Contrast  01/06/2014   CLINICAL DATA:  Abdominal pain right upper quadrant. Paracentesis 1-1/2 weeks ago.  EXAM: CT ABDOMEN AND PELVIS WITH CONTRAST  TECHNIQUE: Multidetector CT imaging of the abdomen and pelvis was performed using the standard protocol following bolus administration of intravenous contrast.  CONTRAST:  OMNIPAQUE IOHEXOL 300 MG/ML  SOLN  COMPARISON:  12/30/2013, 11/18/2013 and 09/14/2008  FINDINGS: Lung bases are unremarkable.  Abdominal images demonstrate a small liver with nodular contour or unchanged. There is mild splenomegaly. Findings are compatible with cirrhosis. There is no change in chronic thrombus over the junction of the splenic vein to portal vein and throughout the main portal vein and left portal vein. There is moderate ascites with slight interval improvement. Gallbladder is contracted. The pancreas and adrenal glands are within normal. There are a few small nonspecific lymph nodes over the upper abdomen and periaortic region without significant change. Kidneys normal in size without hydronephrosis or nephrolithiasis. There is a sub cm hypodensity over the upper pole of the right kidney unchanged and likely a cyst, but too small to characterize. There is calcified plaque over the abdominal aorta and iliac arteries. Appendix is within normal.  There is mild prominence of several proximal jejunal loops without significant change. There is diverticulosis of the colon. There is wall thickening of the ascending colon which may be related to patient's liver failure and hypoproteinemia, although cannot exclude an acute colitis.  Pelvic images demonstrate a normal rectum, bladder and prostate. Stable L3 and L4 compression  fractures. Degenerative changes of the spine and hips.  IMPRESSION: Continued evidence of cirrhosis with portal venous hypertension  and moderate ascites. Ascites is slightly improved compared to the previous exam. 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  Mild wall thickening of the ascending colon which may be secondary to patient's cirrhosis with hypoproteinemia, although cannot exclude an acute colitis.  Chronic prominence of the proximal jejunal loops.  Stable the L3 and L4 compression fractures.   Electronically Signed   By: Elberta Fortis M.D.   On: 01/06/2014 20:05   Dg Chest Port 1 View  01/06/2014   CLINICAL DATA:  Shortness of breath, smoker, hypertension, abdominal pain  EXAM: PORTABLE CHEST - 1 VIEW  COMPARISON:  12/17/2013  FINDINGS: Left clavicular hardware. Bilateral AC joint degenerative change noted. Heart size is normal. Curvilinear left lower lobe atelectasis or scarring. Lungs are hypoaerated with crowding of the bronchovascular markings. No focal pulmonary opacity otherwise. No pleural effusion.  IMPRESSION: Low lung volumes bilaterally with crowding of the bronchovascular markings and left lower lobe atelectasis.   Electronically Signed   By: Christiana Pellant M.D.   On: 01/06/2014 15:39   US Abdomen Limited Ruq  01/06/2014   CLINICAL DATA:  Ascites.  Cirrhosis and hepatitis.  EXAM: LIMITED ABDOMEN ULTRASOUND FOR ASCITES  TECHNIQUE: Limited ultrasound survey for ascites was performed in all four abdominal quadrants.  COMPARISON:  Multiple exams, including 12/31/2013 and 12/30/2013  FINDINGS: Small amount of ascites noted in the right upper quadrant, but not enough to provide a therapeutic benefit if drained. Accordingly, paracentesis was not performed.  IMPRESSION: 1. Small amount of ascites in the right upper quadrant, not judged to provide a significant therapeutic benefit if drained.   Electronically Signed   By: Herbie Baltimore M.D.   On: 01/06/2014 16:42    Impression: 58 year old male with history of cirrhosis (MELD 11), ETOH  abuse, Hep C, presenting with abdominal pain. Appears abdominal pain chronic in nature, and [place] of abdomen without significant amount of ascites to drain. Last paracentesis over a week ago at [facility]: requesting records. Doubt dealing with SBP but agree with empiric antibiotics. He is not currently under the care of a GI specialist; denies any prior treatment for Hepatitis C. Poor historian; I have requested outside records from both [place] and [place], as he states some type of procedure in [place] several months ago.   Discriminant function 17-33 using both PT controls. Alcoholic hepatitis with DF just over 32. Need to check HCV RNA to assess for chronic Hep C before considering prednisolone. Continue to monitor INR, HFP.    Chronic thrombus on CT  within the junction of the splenic portal vein and throughout the main portal vein and left portal vein.  Question of colitis on CT; likely secondary to hypoalbuminemia. Although he reports loose stools yesterday, he has not had any further diarrhea. Monitor for further loose stools.    Plan: Add PPI for GI prophylaxis Check HCV RNA with genotype Agree with empiric antibiotics  Retrieve outside records from [place] and [place].  HFP, INR in am ETOH cessation Further recommendations regarding cirrhosis care after outside records reviewed   Nira Retort, ANP-BC [facility]       LOS: 1 day    01/07/2014, 10:08 AM

## 2014-01-07 NOTE — Consult Note (Addendum)
REVIEWED-NO ADDITIONAL RECOMMENDATIONS. Meld score 15, Darren Abbott. PT WOULD BE A LIVER TRANSPLANT CANDIDATE IF HE ABSTAINS FROM ETOH FOR 6 MOS AND DOCUMENTS PARTICIPATION IN AA AND VISITS WITH MENTAL HEALTH. DISCRIMINANT FUNCTION 17-33.7 IN SETTING OF MILDLY ELEVATED LIVER ENZYMES(AST 49, ALT 23). NOT SURE BENEFITS OF STEROIDS OUTWEIGH THE RISKS  IN THIS PT.  CT SHOWS ASCITES. PT C/O ABD PAIN. CHANGE TO CEFOTAXIME. ADVANCE TO DYSPHAGIA 2 DIET. CONSIDER U/S GUIDED PARACENTESIS FOR THERAPEUTIC EFFECT. Add SERAX prn for anxiety. Pt MAY CONTINUE AS AN OUTPATIENT TO AVOID USING ETOH TO TREAT HIS ANXIETY. NEEDS A NEW PCP AND MENTAL HEALTH SPECIALIST. DISCUSSED WITH PT IF HE CONTINUES TO DRINK HE WILL DIE AND NOT BE A CANDIDATE FOR A LIVER TRANSPLANT. HE UNDERSTANDS HE NEEDS A LIVER TRANSPLANT EVALUATION AS AN OP, TO STOP DRINKING, AND TO SEE A MENTAL HEALTH SPECIALIST.

## 2014-01-08 ENCOUNTER — Inpatient Hospital Stay (HOSPITAL_COMMUNITY): Payer: Medicaid Other

## 2014-01-08 ENCOUNTER — Encounter (HOSPITAL_COMMUNITY): Payer: Self-pay | Admitting: Gastroenterology

## 2014-01-08 DIAGNOSIS — K7460 Unspecified cirrhosis of liver: Secondary | ICD-10-CM | POA: Diagnosis present

## 2014-01-08 DIAGNOSIS — R918 Other nonspecific abnormal finding of lung field: Secondary | ICD-10-CM | POA: Diagnosis present

## 2014-01-08 DIAGNOSIS — D696 Thrombocytopenia, unspecified: Secondary | ICD-10-CM | POA: Diagnosis present

## 2014-01-08 DIAGNOSIS — I8500 Esophageal varices without bleeding: Secondary | ICD-10-CM | POA: Diagnosis present

## 2014-01-08 DIAGNOSIS — R109 Unspecified abdominal pain: Secondary | ICD-10-CM

## 2014-01-08 DIAGNOSIS — R188 Other ascites: Secondary | ICD-10-CM

## 2014-01-08 DIAGNOSIS — K7689 Other specified diseases of liver: Secondary | ICD-10-CM

## 2014-01-08 DIAGNOSIS — K769 Liver disease, unspecified: Secondary | ICD-10-CM | POA: Diagnosis present

## 2014-01-08 DIAGNOSIS — B1920 Unspecified viral hepatitis C without hepatic coma: Secondary | ICD-10-CM

## 2014-01-08 DIAGNOSIS — K7030 Alcoholic cirrhosis of liver without ascites: Secondary | ICD-10-CM

## 2014-01-08 LAB — FERRITIN: Ferritin: 136 ng/mL (ref 22–322)

## 2014-01-08 LAB — CBC
HCT: 39 % (ref 39.0–52.0)
HEMOGLOBIN: 13.7 g/dL (ref 13.0–17.0)
MCH: 32 pg (ref 26.0–34.0)
MCHC: 35.1 g/dL (ref 30.0–36.0)
MCV: 91.1 fL (ref 78.0–100.0)
Platelets: 55 10*3/uL — ABNORMAL LOW (ref 150–400)
RBC: 4.28 MIL/uL (ref 4.22–5.81)
RDW: 13.8 % (ref 11.5–15.5)
WBC: 4.4 10*3/uL (ref 4.0–10.5)

## 2014-01-08 LAB — PROTIME-INR
INR: 1.55 — ABNORMAL HIGH (ref 0.00–1.49)
Prothrombin Time: 18.6 seconds — ABNORMAL HIGH (ref 11.6–15.2)

## 2014-01-08 LAB — ANTITHROMBIN III: AntiThromb III Func: 37 % — ABNORMAL LOW (ref 75–120)

## 2014-01-08 LAB — COMPREHENSIVE METABOLIC PANEL
ALK PHOS: 118 U/L — AB (ref 39–117)
ALT: 22 U/L (ref 0–53)
AST: 46 U/L — ABNORMAL HIGH (ref 0–37)
Albumin: 2 g/dL — ABNORMAL LOW (ref 3.5–5.2)
Anion gap: 10 (ref 5–15)
BUN: 5 mg/dL — ABNORMAL LOW (ref 6–23)
CO2: 21 mEq/L (ref 19–32)
Calcium: 7.3 mg/dL — ABNORMAL LOW (ref 8.4–10.5)
Chloride: 109 mEq/L (ref 96–112)
Creatinine, Ser: 0.57 mg/dL (ref 0.50–1.35)
GFR calc Af Amer: >90 mL/min (ref >90)
GFR calc non Af Amer: >90 mL/min (ref >90)
GLUCOSE: 99 mg/dL (ref 70–99)
POTASSIUM: 3.7 meq/L (ref 3.7–5.3)
Sodium: 140 mEq/L (ref 137–147)
TOTAL PROTEIN: 5.4 g/dL — AB (ref 6.0–8.3)
Total Bilirubin: 2.9 mg/dL — ABNORMAL HIGH (ref 0.3–1.2)

## 2014-01-08 LAB — HCV RNA QUANT RFLX ULTRA OR GENOTYP
HCV QUANT LOG: 5.57 {Log} — AB (ref <1.18)
HCV Quantitative: 375758 IU/mL — ABNORMAL HIGH (ref <15)

## 2014-01-08 LAB — RETICULOCYTES
RBC.: 4.35 MIL/uL (ref 4.22–5.81)
RETIC CT PCT: 1.8 % (ref 0.4–3.1)
Retic Count, Absolute: 78.3 10*3/uL (ref 19.0–186.0)

## 2014-01-08 LAB — FOLATE: Folate: 11.4 ng/mL

## 2014-01-08 LAB — LIPASE, BLOOD: Lipase: 51 U/L (ref 11–59)

## 2014-01-08 LAB — VITAMIN B12: Vitamin B-12: 952 pg/mL — ABNORMAL HIGH (ref 211–911)

## 2014-01-08 MED ORDER — OXYCODONE HCL 5 MG PO TABS
5.0000 mg | ORAL_TABLET | Freq: Once | ORAL | Status: AC
  Administered 2014-01-08: 5 mg via ORAL
  Filled 2014-01-08: qty 1

## 2014-01-08 MED ORDER — RIVAROXABAN 15 MG PO TABS
15.0000 mg | ORAL_TABLET | Freq: Every day | ORAL | Status: DC
  Administered 2014-01-08 – 2014-01-09 (×2): 15 mg via ORAL
  Filled 2014-01-08 (×2): qty 1

## 2014-01-08 MED ORDER — GADOBENATE DIMEGLUMINE 529 MG/ML IV SOLN
14.0000 mL | Freq: Once | INTRAVENOUS | Status: AC | PRN
  Administered 2014-01-08: 14 mL via INTRAVENOUS

## 2014-01-08 MED ORDER — CEFTRIAXONE SODIUM 2 G IJ SOLR
2.0000 g | INTRAMUSCULAR | Status: DC
  Administered 2014-01-09 – 2014-01-10 (×2): 2 g via INTRAVENOUS
  Filled 2014-01-08 (×5): qty 2

## 2014-01-08 MED ORDER — IOHEXOL 300 MG/ML  SOLN
80.0000 mL | Freq: Once | INTRAMUSCULAR | Status: AC | PRN
  Administered 2014-01-08: 80 mL via INTRAVENOUS

## 2014-01-08 MED ORDER — OXYCODONE HCL 5 MG PO TABS
5.0000 mg | ORAL_TABLET | ORAL | Status: DC | PRN
  Administered 2014-01-08 – 2014-01-10 (×6): 5 mg via ORAL
  Filled 2014-01-08 (×6): qty 1

## 2014-01-08 NOTE — Progress Notes (Signed)
[facility] PROGRESS NOTE  Darren Abbott QBH:[id number] DOB: 1956-02-29 DOA: 01/06/2014 PCP: No PCP Per Patient  Assessment/Plan: 1. Abdominal pain, unclear etiology but may be related to chronic portal vein thrombosis per heme note. Continues to improve. No leukocytosis. CT reveals lever lesion, pulmonary nodules one new from 2011, ascites, spleenomegaly. Consider MRI to evaluate new lesion. Consider US guided paracentesis, likely tomorrow.  Abdomen more distended and firm today. Continue empiric antibiotics. Appreciate GI assistance. . Will continue pain meds.  2. Ongoing alcoholism. Will use CIWA protocol. No evidence of alcohol withdrawal. SW consult. GI opine he is candidate for liver transplant but has to stop drinking 6 months. Verbalizes interest 3. Hypertension. Continues to be somewhat soft. Home meds include metoprolol 50 BID. Will discontinue. monitor 4. History of epilepsy. No seizure activity. No meds noted.  5. Hep C: per GI note unknown genotype viral load. GI working up. HCV, RNA pending. Holding prednisolone for now per GI.  6. Colitis: question on CT. Records from [place] indicate similar finding 10/2013.  No diarrhea 7. 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 May be contributing to pain. Evaluated by hemetology who opine this could be contributing to pain and recommended anticoagulation with reduced dose of Xarelto. Plan for OP follow up with Heme at [facility]. Anemia panel, ANA, Hypercoag panel and platelet antibodies pending.  8. Cirrhosis: likely related to ETOH and hepatitis. Alk phos mildly elevated and total bilirubin 2.9 which is slightly higher than what appears to be baseline 2.5. Records from [place] indicate paracentesis monthly since 7/15 with 3L removed each time. Also hospitalized 11/2013 hepatic encephalopathy.  Appreciate GI assitance. May need to consider paracentesis tomorrow. 9. Esophageal varices: per  records EGD 12/14 with grade 2-3 esophageal varices s/p banding and erosive gastritis. Negative H.pylori.  10. Afib: hx of. SR on tele. Formerly on Eliquis. Unclear as to why not currently taking. See #7.  11. Pulmonary nodules: hx of same. CT chest reveal new nodule since 2011. Recommending follow up CT 1 year  12. Liver lesion: per CT see #1.     Code Status: full Family Communication: none present Disposition Plan: home when ready   Consultants:  GI  hemetology  Procedures:  none  Antibiotics:  Rocephin 01/06/14>>01/07/14  Cefotaxime 01/07/14>>    HPI/Subjective: Sitting up eating lunch. Reports better pain management. Appetite good  Objective: Filed Vitals:   01/08/14 1141  BP: 99/62  Pulse: 81  Temp: 98.5 F (36.9 C)  Resp: 20    Intake/Output Summary (Last 24 hours) at 01/08/14 1334 Last data filed at 01/08/14 0600  Gross per 24 hour  Intake 1740.42 ml  Output      1 ml  Net 1739.42 ml   Filed Weights   01/06/14 1400 01/06/14 2028  Weight: 68.04 kg (150 lb) 71.668 kg (158 lb)    Exam:   General:  Somewhat thin appears comfortable  Cardiovascular: RRR No MGR No LE edema PPP  Respiratory: normal effort BS clear to ausculation bilaterally no wheeze  Abdomen: distended, firm, +BS mild tenderness particularly on right to palpation. No guarding  Musculoskeletal: joints without swelling/erythema   Data Reviewed: Basic Metabolic Panel:  Recent Labs Lab 01/06/14 1518 01/07/14 0539 01/08/14 0917  NA 143 140 140  K 4.4 4.1 3.7  CL 106 108 109  CO2 27 24 21   GLUCOSE 81 102* 99  BUN 4* 5* 5*  CREATININE 0.62 0.69 0.57  CALCIUM 8.5  7.6* 7.3*   Liver Function Tests:  Recent Labs Lab 01/06/14 1518 01/07/14 0539 01/08/14 0917  AST 71* 49* 46*  ALT 32 23 22  ALKPHOS 164* 134* 118*  BILITOT 2.7* 2.4* 2.9*  PROT 7.0 5.5* 5.4*  ALBUMIN 2.7* 2.1* 2.0*    Recent Labs Lab 01/06/14 1518 01/08/14 0832  LIPASE 71* 51   No results found  for this basename: AMMONIA,  in the last 168 hours CBC:  Recent Labs Lab 01/06/14 1518 01/07/14 0539 01/08/14 0832  WBC 4.3 5.1 4.4  NEUTROABS 2.5  --   --   HGB 14.6 13.5 13.7  HCT 41.2 38.8* 39.0  MCV 89.8 90.4 91.1  PLT 72* 58* 55*   Cardiac Enzymes: No results found for this basename: CKTOTAL, CKMB, CKMBINDEX, TROPONINI,  in the last 168 hours BNP (last 3 results) No results found for this basename: PROBNP,  in the last 8760 hours CBG: No results found for this basename: GLUCAP,  in the last 168 hours  No results found for this or any previous visit (from the past 240 hour(s)).   Studies: Ct Chest W Contrast  01/08/2014   CLINICAL DATA:  Tobacco use. Blood clot, query occult malignancy. Long smoking history.  EXAM: CT CHEST WITH CONTRAST  TECHNIQUE: Multidetector CT imaging of the chest was performed during intravenous contrast administration.  CONTRAST:  57m OMNIPAQUE IOHEXOL 300 MG/ML  SOLN  COMPARISON:  01/06/2014  FINDINGS: Please note that today's exam was not performed using the pulmonary embolus protocol. If this was in error, [facility] and we will have the patient return for repeat imaging using CT angiogram technique. Right hilar lymph node, 0.9 cm in short axis. Subcarinal lymph node, 1.3 cm in short axis. Small AP window lymph nodes are observed. Distal esophageal wall thickening is suspected with some low-density lower paraesophageal lymph nodes and potentially some small paraesophageal varices.  Hepatic cirrhosis is observed with diffuse mesenteric and omental edema with slight associated nodularity as reported on prior CT abdomen. Perihepatic ascites noted. Splenomegaly is partially shown. In the part of the liver that we included, there is a 1.1 x 0.8 cm arterial phase enhancing nodule posteriorly in segment 6 of the liver, image 60 series 2.  Centrilobular emphysema. 5 x 3 mm right upper lobe nodule, image 24 series 3, no change  from 03/01/2010. 3 mm triangular-shaped nodule, right upper lobe, image 29 series 3, not readily seen on the prior chest CT. 0.5 cm subpleural lymph node along the left major fissure, image 24 series 3, no change from 03/01/2010. Dependent subsegmental atelectasis or scarring is present in both lower lobes.  Left clavicular plate and screw fixator. Thoracic spondylosis without malalignment.  IMPRESSION: 1. Arterial phase enhancing 1.1 x 0.8 cm lesion in segment 6 of the liver in this patient with cirrhosis. I cannot exclude a small hepatic cellular carcinoma. Consider hepatic protocol MRI with and without contrast for confirmation and assessment. 2. No lung cancer is observed. There is several small pulmonary nodules in the 5 mm range which are stable from 2011 and considered benign. There is a 3 mm triangular shaped nodule in the right upper lobe which I do not see on the prior 2011 exam in which it probably be followed. I would suggest a followup chest CT in 1 years time given that the patient does appear to have risk factors for malignancy. This recommendation follows the consensus statement: Guidelines for Management of Small Pulmonary Nodules  Detected on CT Scans: A Statement from the [employer] as published in Radiology 2005; 237:395-400. 3. Centrilobular emphysema. 4. Upper abdominal ascites and mesenteric and omental edema/infiltration. 5. Distal esophageal wall thickening -esophagitis is not excluded. 6. Splenomegaly. 7. Please note that today's exam was not performed using the pulmonary embolus protocol. If this was in error, please alert the [facility] Radiology Department and we will have the patient return for repeat imaging using CT angiogram technique.   Electronically Signed   By: Sherryl Barters M.D.   On: 01/08/2014 09:34   Ct Abdomen Pelvis W Contrast  01/06/2014   CLINICAL DATA:  Abdominal pain right upper quadrant. Paracentesis 1-1/2 weeks ago.  EXAM: CT ABDOMEN AND PELVIS WITH  CONTRAST  TECHNIQUE: Multidetector CT imaging of the abdomen and pelvis was performed using the standard protocol following bolus administration of intravenous contrast.  CONTRAST:  111m OMNIPAQUE IOHEXOL 300 MG/ML  SOLN  COMPARISON:  12/30/2013, 11/18/2013 and 09/14/2008  FINDINGS: Lung bases are unremarkable.  Abdominal images demonstrate a small liver with nodular contour or unchanged. There is mild splenomegaly. Findings are compatible with cirrhosis. There is no change in chronic thrombus over the junction of the splenic vein to portal vein and throughout the main portal vein and left portal vein. There is moderate ascites with slight interval improvement. Gallbladder is contracted. The pancreas and adrenal glands are within normal. There are a few small nonspecific lymph nodes over the upper abdomen and periaortic region without significant change. Kidneys normal in size without hydronephrosis or nephrolithiasis. There is a sub cm hypodensity over the upper pole of the right kidney unchanged and likely a cyst, but too small to characterize. There is calcified plaque over the abdominal aorta and iliac arteries. Appendix is within normal.  There is mild prominence of several proximal jejunal loops without significant change. There is diverticulosis of the colon. There is wall thickening of the ascending colon which may be related to patient's liver failure and hypoproteinemia, although cannot exclude an acute colitis.  Pelvic images demonstrate a normal rectum, bladder and prostate. Stable L3 and L4 compression fractures. Degenerative changes of the spine and hips.  IMPRESSION: Continued evidence of cirrhosis with portal venous hypertension and moderate ascites. Ascites is slightly improved compared to the previous exam. 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  Mild wall thickening of the ascending colon which may be secondary to  patient's cirrhosis with hypoproteinemia, although cannot exclude an acute colitis.  Chronic prominence of the proximal jejunal loops.  Stable the L3 and L4 compression fractures.   Electronically Signed   By: DMarin OlpM.D.   On: 01/06/2014 20:05   Dg Chest Port 1 View  01/06/2014   CLINICAL DATA:  Shortness of breath, smoker, hypertension, abdominal pain  EXAM: PORTABLE CHEST - 1 VIEW  COMPARISON:  12/17/2013  FINDINGS: Left clavicular hardware. Bilateral AC joint degenerative change noted. Heart size is normal. Curvilinear left lower lobe atelectasis or scarring. Lungs are hypoaerated with crowding of the bronchovascular markings. No focal pulmonary opacity otherwise. No pleural effusion.  IMPRESSION: Low lung volumes bilaterally with crowding of the bronchovascular markings and left lower lobe atelectasis.   Electronically Signed   By: GConchita ParisM.D.   On: 01/06/2014 15:39   UKoreaAbdomen Limited Ruq  01/06/2014   CLINICAL DATA:  Ascites.  Cirrhosis and hepatitis.  EXAM: LIMITED ABDOMEN ULTRASOUND FOR ASCITES  TECHNIQUE: Limited  ultrasound survey for ascites was performed in all four abdominal quadrants.  COMPARISON:  Multiple exams, including 12/31/2013 and 12/30/2013  FINDINGS: Small amount of ascites noted in the right upper quadrant, but not enough to provide a therapeutic benefit if drained. Accordingly, paracentesis was not performed.  IMPRESSION: 1. Small amount of ascites in the right upper quadrant, not judged to provide a significant therapeutic benefit if drained.   Electronically Signed   By: Sherryl Barters M.D.   On: 01/06/2014 16:42    Scheduled Meds: . cefoTAXime (CLAFORAN) IV  2 g Intravenous 4 times per day  . folic acid  1 mg Oral Daily  . Influenza vac split quadrivalent PF  0.5 mL Intramuscular Tomorrow-1000  . lactulose  20 g Oral BID  . multivitamin with minerals  1 tablet Oral Daily  . pantoprazole  40 mg Oral BID AC  . Rivaroxaban  15 mg Oral Q breakfast  .  sodium chloride  3 mL Intravenous Q12H  . thiamine  100 mg Oral Daily   Or  . thiamine  100 mg Intravenous Daily   Continuous Infusions: . sodium chloride 50 mL/hr at 01/07/14 9509    Active Problems:   Atrial fibrillation   Unspecified epilepsy without mention of intractable epilepsy   Unspecified essential hypertension   Abdominal pain   Ascites   Hepatitis C   Thrombus   Esophageal varices   Cirrhosis   Thrombocytopenia, unspecified   Pulmonary nodules    Time spent: 35 minutes    [facility] Pager [phone]. If 7PM-7AM, please contact night-coverage at [email or web address], password [facility] 01/08/2014, 1:34 PM  LOS: 2 days

## 2014-01-08 NOTE — Progress Notes (Signed)
The patient was seen and examined. His chart, vital signs, laboratory studies were reviewed. He was discussed with nurse practitioner, Ms. Vedia Coffer. Agree with her findings with additions below.  -On exam, the patient has some distention which is likely associated with reaccumulation of ascitic fluid. He may need another paracentesis prior to discharge.  -MRI of the abdomen has been ordered for evaluation of the liver lesion as the patient is certainly at risk of hepatocellular carcinoma in the setting of cirrhosis and chronic hepatitis C.  -Venous Doppler ultrasound of the abdomen has been ordered to evaluate the thrombus throughout the splenic portal vein and throughout the main portal vein and left portal vein.  -Hematology/oncology started low-dose Xarelto for the chronic thrombus, but with caution given the patient's underlying coagulopathy from cirrhosis and chronic thrombocytopenia. Hematology has ordered additional laboratory studies to assess for hypocoagulable disorder.  -Thrombocytopenia is likely associated with cirrhosis and splenomegaly. His vitamin B12 level is normal-high. We'll order TSH. Additional studies ordered by hematology.  -His lipase which was modestly elevated on admission, has normalized. Doubt clinical acute pancreatitis. His abdominal pain may be associated with the portal thrombus. He is being treated with antibiotics per GI empirically.

## 2014-01-08 NOTE — Consult Note (Signed)
[facility] Consultation Oncology  Name: Darren Abbott      MRN: [id number]    Location: P379/K240-97  Date: 01/08/2014 Time:7:58 AM   REFERRING PHYSICIAN:  Danie Binder, MD  REASON FOR CONSULT:  Portal vein thrombosis   DIAGNOSIS:  Portal vein thrombosis in the setting of EtOH cirrhosis and hepatitis C  HISTORY OF PRESENT ILLNESS:   [name] is a 58 yo man with a past medical history significant for a-fib, epilepsy, HTN, Hepatitis C, tobacco abuse, EtOH abuse, and cirrhosis of liver who presented to the [facility] ED on 01/06/2014 with abdominal pain.  I personally reviewed and went over laboratory results with the patient.  The results are noted within this dictation.  I personally reviewed and went over radiographic studies with the patient.  The results are noted within this dictation.  CT of abd/pelvis on 01/06/2014 demonstrates: Continued evidence of cirrhosis with portal venous hypertension and  moderate ascites. Ascites is slightly improved compared to the  previous exam. Chronic thrombus within the junction of the splenic  portal vein and throughout the main portal vein and left portal  vein.  Mild wall thickening of the ascending colon which may be secondary  to patient's cirrhosis with hypoproteinemia, although cannot exclude  an acute colitis.  Chronic prominence of the proximal jejunal loops.  Stable the L3 and L4 compression fractures.  Sarp admits to EtOH and tobacco abuse, both of which he reports he will stop once out of the hospital.  EtOH and tobacco cessation education was provided today.  He admits to abdominal pain, particularly on palpation.  He admits to chills, but denies fevers, night sweats, nausea, vomiting, chest pain, heart palpitations, hemoptysis, blood loss, black tarry stool, blood in stool, and SOB.  Other than abdominal pain, hematologic ROS questioning is negative.   I have provided the patient education regarding portal vein  thrombosis.   PAST MEDICAL HISTORY:   Past Medical History  Diagnosis Date  . Malnutrition of moderate degree   . Altered mental status   . Unspecified essential hypertension   . Anxiety state, unspecified   . Transient ischemic attack (TIA), and cerebral infarction without residual deficits(V12.54)   . Body mass index between 19-24, adult   . Atrial fibrillation     episode at [place].  [facility] Heart murmur   . Obstructive sleep apnea (adult) (pediatric)     to start back after surgery- not used for 2-70month  . GERD (gastroesophageal reflux disease)   . Unspecified epilepsy without mention of intractable epilepsy 13    only once  . Hand laceration 80    end of all fingers on rt hand  . Old myocardial infarction   . Asthma     "outgrew it"  . Borderline diabetes   . 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 Acute alcoholic hepatitis     possible hepatic encephalopathy May 10, 2011  . Daily headache   . Migraine     "daily" (08/30/2013)  . Stroke ~ 2000    "mild"  . Arthritis     "I'm ate up w/it" (08/30/2013)  . Chronic lower back pain   . On home oxygen therapy     "2L prn" (08/30/2013)  . Chronic kidney disease     "one isn't functioning like it should" (08/30/2013)    ALLERGIES: No Known Allergies    MEDICATIONS: I have reviewed the patient's current medications.  PAST SURGICAL HISTORY Past Surgical History  Procedure Laterality Date  . Clavicle surgery Left ~ 2008    "crushed it"  . Hand surgery Left     lacerations of thumb. reattached  . Nasal septum surgery  08/30/2013  . Rhinoplasty  08/30/2013  . Hand surgery Right 1980's    "all 4 fingers cut off and reattached"  . Foot fracture surgery Right   . Septoplasty N/A 08/30/2013    Procedure: SEPTOPLASTY;  Surgeon: Izora Gala, MD;  Location: [place];  Service: ENT;  Laterality: N/A;  . Rhinoplasty N/A 08/30/2013    Procedure: RHINOPLASTY;  Surgeon: Izora Gala, MD;  [facility]  OR;  Service: ENT;  Laterality: N/A;    FAMILY HISTORY: Family History  Problem Relation Age of Onset  . Colon cancer Neg Hx     SOCIAL HISTORY:  reports that he has been smoking Cigarettes.  He has a 11 pack-year smoking history. He has never used smokeless tobacco. He reports that he drinks alcohol. He reports that he does not use illicit drugs.  PERFORMANCE STATUS: The patient's performance status is 2 - Symptomatic, <50% confined to bed  PHYSICAL EXAM: Most Recent Vital Signs: Blood pressure 93/60, pulse 78, temperature 98.9 F (37.2 C), temperature source Oral, resp. rate 22, height 6' 1"  (1.854 m), weight 158 lb (71.668 kg), SpO2 96.00%. General appearance: alert, cooperative, appears older than stated age and no distress Head: Normocephalic, without obvious abnormality, atraumatic Eyes: conjunctivae/corneas clear. PERRL, EOM's intact. Fundi benign. Throat: normal findings: lips normal without lesions, buccal mucosa normal, tongue midline and normal and oropharynx pink & moist without lesions or evidence of thrush Neck: no adenopathy and supple, symmetrical, trachea midline Lungs: clear to auscultation bilaterally Heart: regular rate and rhythm Abdomen: normal findings: bowel sounds normal and spleen non-palpable and abnormal findings:  ascites and moderate tenderness in the RUQ and in the RLQ Extremities: extremities normal, atraumatic, no cyanosis or edema Skin: Skin color, texture, turgor normal. No rashes or lesions Neurologic: Grossly normal  LABORATORY DATA:  Results for orders placed during the hospital encounter of 01/06/14 (from the past 48 hour(s))  CBC WITH DIFFERENTIAL     Status: Abnormal   Collection Time    01/06/14  3:18 PM      Result Value Ref Range   WBC 4.3  4.0 - 10.5 K/uL   RBC 4.59  4.22 - 5.81 MIL/uL   Hemoglobin 14.6  13.0 - 17.0 g/dL   HCT 41.2  39.0 - 52.0 %   MCV 89.8  78.0 - 100.0 fL   MCH 31.8  26.0 - 34.0 pg   MCHC 35.4  30.0 - 36.0 g/dL    RDW 14.2  11.5 - 15.5 %   Platelets 72 (*) 150 - 400 K/uL   Comment: SPECIMEN CHECKED FOR CLOTS     PLATELETS APPEAR DECREASED   Neutrophils Relative % 59  43 - 77 %   Neutro Abs 2.5  1.7 - 7.7 K/uL   Lymphocytes Relative 25  12 - 46 %   Lymphs Abs 1.1  0.7 - 4.0 K/uL   Monocytes Relative 11  3 - 12 %   Monocytes Absolute 0.5  0.1 - 1.0 K/uL   Eosinophils Relative 5  0 - 5 %   Eosinophils Absolute 0.2  0.0 - 0.7 K/uL   Basophils Relative 1  0 - 1 %   Basophils Absolute 0.0  0.0 - 0.1 K/uL  COMPREHENSIVE METABOLIC PANEL  Status: Abnormal   Collection Time    01/06/14  3:18 PM      Result Value Ref Range   Sodium 143  137 - 147 mEq/L   Potassium 4.4  3.7 - 5.3 mEq/L   Chloride 106  96 - 112 mEq/L   CO2 27  19 - 32 mEq/L   Glucose, Bld 81  70 - 99 mg/dL   BUN 4 (*) 6 - 23 mg/dL   Creatinine, Ser 0.62  0.50 - 1.35 mg/dL   Calcium 8.5  8.4 - 10.5 mg/dL   Total Protein 7.0  6.0 - 8.3 g/dL   Albumin 2.7 (*) 3.5 - 5.2 g/dL   AST 71 (*) 0 - 37 U/L   ALT 32  0 - 53 U/L   Alkaline Phosphatase 164 (*) 39 - 117 U/L   Total Bilirubin 2.7 (*) 0.3 - 1.2 mg/dL   GFR calc non Af Amer >90  >90 mL/min   GFR calc Af Amer >90  >90 mL/min   Comment: (NOTE)     The eGFR has been calculated using the CKD EPI equation.     This calculation has not been validated in all clinical situations.     eGFR's persistently <90 mL/min signify possible Chronic Kidney     Disease.   Anion gap 10  5 - 15  LIPASE, BLOOD     Status: Abnormal   Collection Time    01/06/14  3:18 PM      Result Value Ref Range   Lipase 71 (*) 11 - 59 U/L  ETHANOL     Status: Abnormal   Collection Time    01/06/14  3:19 PM      Result Value Ref Range   Alcohol, Ethyl (B) 68 (*) 0 - 11 mg/dL   Comment:            LOWEST DETECTABLE LIMIT FOR     SERUM ALCOHOL IS 11 mg/dL     FOR MEDICAL PURPOSES ONLY  PROTIME-INR     Status: Abnormal   Collection Time    01/06/14  3:19 PM      Result Value Ref Range   Prothrombin  Time 17.1 (*) 11.6 - 15.2 seconds   INR 1.39  0.00 - 1.49  COMPREHENSIVE METABOLIC PANEL     Status: Abnormal   Collection Time    01/07/14  5:39 AM      Result Value Ref Range   Sodium 140  137 - 147 mEq/L   Potassium 4.1  3.7 - 5.3 mEq/L   Chloride 108  96 - 112 mEq/L   CO2 24  19 - 32 mEq/L   Glucose, Bld 102 (*) 70 - 99 mg/dL   BUN 5 (*) 6 - 23 mg/dL   Creatinine, Ser 0.69  0.50 - 1.35 mg/dL   Calcium 7.6 (*) 8.4 - 10.5 mg/dL   Total Protein 5.5 (*) 6.0 - 8.3 g/dL   Albumin 2.1 (*) 3.5 - 5.2 g/dL   AST 49 (*) 0 - 37 U/L   ALT 23  0 - 53 U/L   Alkaline Phosphatase 134 (*) 39 - 117 U/L   Total Bilirubin 2.4 (*) 0.3 - 1.2 mg/dL   GFR calc non Af Amer >90  >90 mL/min   GFR calc Af Amer >90  >90 mL/min   Comment: (NOTE)     The eGFR has been calculated using the CKD EPI equation.     This calculation has not been  validated in all clinical situations.     eGFR's persistently <90 mL/min signify possible Chronic Kidney     Disease.   Anion gap 8  5 - 15  CBC     Status: Abnormal   Collection Time    01/07/14  5:39 AM      Result Value Ref Range   WBC 5.1  4.0 - 10.5 K/uL   RBC 4.29  4.22 - 5.81 MIL/uL   Hemoglobin 13.5  13.0 - 17.0 g/dL   HCT 38.8 (*) 39.0 - 52.0 %   MCV 90.4  78.0 - 100.0 fL   MCH 31.5  26.0 - 34.0 pg   MCHC 34.8  30.0 - 36.0 g/dL   RDW 14.3  11.5 - 15.5 %   Platelets 58 (*) 150 - 400 K/uL   Comment: SPECIMEN CHECKED FOR CLOTS     CONSISTENT WITH PREVIOUS RESULT  PROTIME-INR     Status: Abnormal   Collection Time    01/07/14  5:39 AM      Result Value Ref Range   Prothrombin Time 18.4 (*) 11.6 - 15.2 seconds   INR 1.53 (*) 0.00 - 1.49      RADIOGRAPHY: Ct Abdomen Pelvis W Contrast  01/06/2014   CLINICAL DATA:  Abdominal pain right upper quadrant. Paracentesis 1-1/2 weeks ago.  EXAM: CT ABDOMEN AND PELVIS WITH CONTRAST  TECHNIQUE: Multidetector CT imaging of the abdomen and pelvis was performed using the standard protocol following bolus  administration of intravenous contrast.  CONTRAST:  123m OMNIPAQUE IOHEXOL 300 MG/ML  SOLN  COMPARISON:  12/30/2013, 11/18/2013 and 09/14/2008  FINDINGS: Lung bases are unremarkable.  Abdominal images demonstrate a small liver with nodular contour or unchanged. There is mild splenomegaly. Findings are compatible with cirrhosis. There is no change in chronic thrombus over the junction of the splenic vein to portal vein and throughout the main portal vein and left portal vein. There is moderate ascites with slight interval improvement. Gallbladder is contracted. The pancreas and adrenal glands are within normal. There are a few small nonspecific lymph nodes over the upper abdomen and periaortic region without significant change. Kidneys normal in size without hydronephrosis or nephrolithiasis. There is a sub cm hypodensity over the upper pole of the right kidney unchanged and likely a cyst, but too small to characterize. There is calcified plaque over the abdominal aorta and iliac arteries. Appendix is within normal.  There is mild prominence of several proximal jejunal loops without significant change. There is diverticulosis of the colon. There is wall thickening of the ascending colon which may be related to patient's liver failure and hypoproteinemia, although cannot exclude an acute colitis.  Pelvic images demonstrate a normal rectum, bladder and prostate. Stable L3 and L4 compression fractures. Degenerative changes of the spine and hips.  IMPRESSION: Continued evidence of cirrhosis with portal venous hypertension and moderate ascites. Ascites is slightly improved compared to the previous exam. 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  Mild wall thickening of the ascending colon which may be secondary to patient's cirrhosis with hypoproteinemia, although cannot exclude an acute colitis.  Chronic prominence of the proximal jejunal  loops.  Stable the L3 and L4 compression fractures.   Electronically Signed   By: DMarin OlpM.D.   On: 01/06/2014 20:05   Dg Chest Port 1 View  01/06/2014   CLINICAL DATA:  Shortness of breath, smoker, hypertension, abdominal pain  EXAM: PORTABLE CHEST - 1 VIEW  COMPARISON:  12/17/2013  FINDINGS: Left clavicular hardware. Bilateral AC joint degenerative change noted. Heart size is normal. Curvilinear left lower lobe atelectasis or scarring. Lungs are hypoaerated with crowding of the bronchovascular markings. No focal pulmonary opacity otherwise. No pleural effusion.  IMPRESSION: Low lung volumes bilaterally with crowding of the bronchovascular markings and left lower lobe atelectasis.   Electronically Signed   By: Conchita Paris M.D.   On: 01/06/2014 15:39   US Abdomen Limited Ruq  01/06/2014   CLINICAL DATA:  Ascites.  Cirrhosis and hepatitis.  EXAM: LIMITED ABDOMEN ULTRASOUND FOR ASCITES  TECHNIQUE: Limited ultrasound survey for ascites was performed in all four abdominal quadrants.  COMPARISON:  Multiple exams, including 12/31/2013 and 12/30/2013  FINDINGS: Small amount of ascites noted in the right upper quadrant, but not enough to provide a therapeutic benefit if drained. Accordingly, paracentesis was not performed.  IMPRESSION: 1. Small amount of ascites in the right upper quadrant, not judged to provide a significant therapeutic benefit if drained.   Electronically Signed   By: Sherryl Barters M.D.   On: 01/06/2014 16:42       PATHOLOGY:  None   ASSESSMENT:  1. Chronic portal vein thrombosis, likely contributing to abdominal pain 2. Thrombocytopenia, etiology unknown, work-up underway 3. Cirrhosis of liver, mutlifactorial.  Child-Pugh Class B. 4. Hepatitis C infection 5. Tobacco abuse  6. EtOHism  Patient Active Problem List   Diagnosis Date Noted  . Hepatitis C 01/07/2014  . Thrombus 01/07/2014  . Abdominal pain 01/06/2014  . Ascites 01/06/2014  . Nasal septal deviation  08/30/2013  . S/P rhinoplasty 08/30/2013  . Atrial fibrillation   . Body mass index between 19-24, adult   . Tobacco use disorder   . Transient ischemic attack (TIA), and cerebral infarction without residual deficits   . Anxiety state, unspecified   . Old myocardial infarction   . Obstructive sleep apnea (adult) (pediatric)   . Unspecified epilepsy without mention of intractable epilepsy   . Unspecified essential hypertension   . Acute alcoholic hepatitis   . Altered mental status   . Unspecified viral hepatitis C with hepatic coma      PLAN:  1. I personally reviewed and went over laboratory results with the patient.  The results are noted within this dictation. 2. I personally reviewed and went over radiographic studies with the patient.  The results are noted within this dictation.   3. Labs today: anemia panel, ANA, Hypercoag panel, platelet antibodies 4. Fecal occult blood testing. 5. CT of chest with contrast given his smoking history and thrombosis to evaluate for occult malignancy. 6. Due to abdominal pain, thought to be secondary to portal vein thrombosis, we will anticoagulate with a reduced-dose of Xarelto of 15 mg daily (without loading dose). 7. Outpatient follow-up with [facility] to supervise anticoagulation in 4 weeks.  All questions were answered. The patient knows to call the clinic with any problems, questions or concerns. We can certainly see the patient much sooner if necessary.  Patient and plan discussed with Dr. Farrel Gobble and he is in agreement with the aforementioned.   KEFALAS,THOMAS 01/08/2014

## 2014-01-08 NOTE — Plan of Care (Signed)
Problem: Phase II Progression Outcomes Goal: Vital signs remain stable Outcome: Completed/Met Date Met:  01/08/14 SBP still trending in the 90s, but stable.

## 2014-01-08 NOTE — Progress Notes (Signed)
Received call from pharmacy. Cefotaxime on back order, only lasting through tomorrow morning. Change to Rocephin 2 g IV every 24 hours.
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
## 2014-01-08 NOTE — Progress Notes (Signed)
Subjective: Continued RUQ pain, radiates around back. Mild nausea but no vomiting. Reports 4 bowel movements yesterday, fluctuating between solid and mixed with water. Would like to advance diet. Records reviewed.   Objective: Vital signs in last 24 hours: Temp:  [98.4 F (36.9 C)-99.1 F (37.3 C)] 98.9 F (37.2 C) (09/16 0531) Pulse Rate:  [69-78] 78 (09/16 0545) Resp:  [18-28] 22 (09/16 0545) BP: (85-100)/(53-61) 93/60 mmHg (09/16 0545) SpO2:  [92 %-98 %] 96 % (09/16 0545) Last BM Date: 01/07/14 General:   Alert and oriented, pleasant, sallow-appearing Head:  Normocephalic and atraumatic. Abdomen:  Bowel sounds present, soft, TTP RUQ, RLQ, right flank.  Extremities:  Without edema. Neurologic:  Alert and  oriented x4;  grossly normal neurologically. Skin:  Warm and dry, intact without significant lesions.  Psych:  Alert and cooperative. Normal mood and affect.  Intake/Output from previous day: 09/15 0701 - 09/16 0700 In: 1980.4 [P.O.:720; I.V.:1210.4; IV Piggyback:50] Out: 252 [Urine:250; Stool:2] Intake/Output this shift:    Lab Results:  Recent Labs  01/06/14 1518 01/07/14 0539  WBC 4.3 5.1  HGB 14.6 13.5  HCT 41.2 38.8*  PLT 72* 58*   BMET  Recent Labs  01/06/14 1518 01/07/14 0539  NA 143 140  K 4.4 4.1  CL 106 108  CO2 27 24  GLUCOSE 81 102*  BUN 4* 5*  CREATININE 0.62 0.69  CALCIUM 8.5 7.6*   LFT  Recent Labs  01/06/14 1518 01/07/14 0539  PROT 7.0 5.5*  ALBUMIN 2.7* 2.1*  AST 71* 49*  ALT 32 23  ALKPHOS 164* 134*  BILITOT 2.7* 2.4*   PT/INR  Recent Labs  01/06/14 1519 01/07/14 0539  LABPROT 17.1* 18.4*  INR 1.39 1.53*     Studies/Results: Ct Abdomen Pelvis W Contrast  01/06/2014   CLINICAL DATA:  Abdominal pain right upper quadrant. Paracentesis 1-1/2 weeks ago.  EXAM: CT ABDOMEN AND PELVIS WITH CONTRAST  TECHNIQUE: Multidetector CT imaging of the abdomen and pelvis was performed using the standard protocol following bolus  administration of intravenous contrast.  CONTRAST:  OMNIPAQUE IOHEXOL 300 MG/ML  SOLN  COMPARISON:  12/30/2013, 11/18/2013 and 09/14/2008  FINDINGS: Lung bases are unremarkable.  Abdominal images demonstrate a small liver with nodular contour or unchanged. There is mild splenomegaly. Findings are compatible with cirrhosis. There is no change in chronic thrombus over the junction of the splenic vein to portal vein and throughout the main portal vein and left portal vein. There is moderate ascites with slight interval improvement. Gallbladder is contracted. The pancreas and adrenal glands are within normal. There are a few small nonspecific lymph nodes over the upper abdomen and periaortic region without significant change. Kidneys normal in size without hydronephrosis or nephrolithiasis. There is a sub cm hypodensity over the upper pole of the right kidney unchanged and likely a cyst, but too small to characterize. There is calcified plaque over the abdominal aorta and iliac arteries. Appendix is within normal.  There is mild prominence of several proximal jejunal loops without significant change. There is diverticulosis of the colon. There is wall thickening of the ascending colon which may be related to patient's liver failure and hypoproteinemia, although cannot exclude an acute colitis.  Pelvic images demonstrate a normal rectum, bladder and prostate. Stable L3 and L4 compression fractures. Degenerative changes of the spine and hips.  IMPRESSION: Continued evidence of cirrhosis with portal venous hypertension and moderate ascites. Ascites is slightly improved compared to the previous exam. 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  Mild wall thickening of the ascending colon which may be secondary to patient's cirrhosis with hypoproteinemia, although cannot exclude an acute colitis.  Chronic prominence of the proximal jejunal  loops.  Stable the L3 and L4 compression fractures.   Electronically Signed   By: Elberta Fortis M.D.   On: 01/06/2014 20:05   Dg Chest Port 1 View  01/06/2014   CLINICAL DATA:  Shortness of breath, smoker, hypertension, abdominal pain  EXAM: PORTABLE CHEST - 1 VIEW  COMPARISON:  12/17/2013  FINDINGS: Left clavicular hardware. Bilateral AC joint degenerative change noted. Heart size is normal. Curvilinear left lower lobe atelectasis or scarring. Lungs are hypoaerated with crowding of the bronchovascular markings. No focal pulmonary opacity otherwise. No pleural effusion.  IMPRESSION: Low lung volumes bilaterally with crowding of the bronchovascular markings and left lower lobe atelectasis.   Electronically Signed   By: Christiana Pellant M.D.   On: 01/06/2014 15:39   US Abdomen Limited Ruq  01/06/2014   CLINICAL DATA:  Ascites.  Cirrhosis and hepatitis.  EXAM: LIMITED ABDOMEN ULTRASOUND FOR ASCITES  TECHNIQUE: Limited ultrasound survey for ascites was performed in all four abdominal quadrants.  COMPARISON:  Multiple exams, including 12/31/2013 and 12/30/2013  FINDINGS: Small amount of ascites noted in the right upper quadrant, but not enough to provide a therapeutic benefit if drained. Accordingly, paracentesis was not performed.  IMPRESSION: 1. Small amount of ascites in the right upper quadrant, not judged to provide a significant therapeutic benefit if drained.   Electronically Signed   By: Herbie Baltimore M.D.   On: 01/06/2014 16:42    Outside records reviewed extensively. Hospitalized in [facility] 03/2013 after seizure. Started on Eliquis due to afib, but this was discontinued at time of discharge. EGD on 03/25/13 by Dr. Samuella Cota with grade 2-3 esophageal varices s/p banding X 5.0 Erosive gastritis. Negative H.pylori. Recommendations for repeat EGD in several weeks. Does not appear this has been done. Imaging in Dec 2014 did not reveal evidence of thrombosis.   US paracentesis at [facility] on 7/28,  8/26, and 9/8. Approximately 3 liters removed each time. Per records, no SBP. Hospitalized with hepatic encephalopathy Aug 2015. CT in July 2015 at [facility] with non-occlusive thrombus in portal vein, mucosal edema in colon.   Assessment: 58 year old male with history of ETOH abuse, Hep C, cirrhosis, presenting with abdominal pain. MELD 15. Discriminant function 17-33 on admission in setting of mildly elevated transaminases. HCV RNA by PCR pending. Prednisolone on hold as benefits of steroid therapy may not outweigh the risks.   Continue antibiotics for empiric treatment of possible SBP; CT showing ascites but ultrasound with small amount in RUQ. Prior records from [place] reviewed, with last paracentesis around 9/8 of 3 liters. Records stating negative fluid analysis for SBP. EGD in Dec 2014 with grade 2-3 esophageal varices s/p banding X 5. Appears did not have early interval EGD for reassessment; also appears was not maintained on non-selective beta-blocker for prophylaxis.   Chronic thrombus on CT within the junction of the splenic portal vein and throughout the main portal vein and left portal vein. Possible contributor to abdominal pain. Appreciate Hematology input and recommendations. Low-dose Xarelto recommended.   Question of colitis on CT; likely secondary to hypoalbuminemia. Similar finding on CT in July 2015.    Plan: Repeat CMP, INR, CBC Hold on prednisolone  PPI for GI prophylaxis Follow-up on pending HCV  RNA Continue cefotaxime; needs IV abx for total of 5 days ETOH cessation with liver transplant evaluation as outpatient EGD for variceal surveillance to be considered at a minimum 3 months from now. Will need to coordinate with hematology due to Xarelto (discussed with Dr. Darrick Penna).  Hold on non-selective beta-blocker due to possible SBP   Nira Retort, ANP-BC [facility]      LOS: 2 days    01/08/2014, 8:56 AM

## 2014-01-09 ENCOUNTER — Inpatient Hospital Stay (HOSPITAL_COMMUNITY): Payer: Medicaid Other

## 2014-01-09 ENCOUNTER — Other Ambulatory Visit (HOSPITAL_COMMUNITY): Payer: Self-pay

## 2014-01-09 DIAGNOSIS — B182 Chronic viral hepatitis C: Secondary | ICD-10-CM

## 2014-01-09 DIAGNOSIS — R918 Other nonspecific abnormal finding of lung field: Secondary | ICD-10-CM

## 2014-01-09 DIAGNOSIS — R1011 Right upper quadrant pain: Secondary | ICD-10-CM

## 2014-01-09 DIAGNOSIS — D696 Thrombocytopenia, unspecified: Secondary | ICD-10-CM

## 2014-01-09 LAB — LUPUS ANTICOAGULANT PANEL
DRVVT: 29.9 s (ref <42.9)
LUPUS ANTICOAGULANT: NOT DETECTED
PTT Lupus Anticoagulant: 40.6 secs (ref 28.0–43.0)

## 2014-01-09 LAB — IRON AND TIBC
Iron: 145 ug/dL — ABNORMAL HIGH (ref 42–135)
SATURATION RATIOS: 73 % — AB (ref 20–55)
TIBC: 200 ug/dL — AB (ref 215–435)
UIBC: 55 ug/dL — ABNORMAL LOW (ref 125–400)

## 2014-01-09 LAB — PROTEIN S, TOTAL: PROTEIN S AG TOTAL: 60 % (ref 60–150)

## 2014-01-09 LAB — BASIC METABOLIC PANEL
Anion gap: 11 (ref 5–15)
BUN: 6 mg/dL (ref 6–23)
CALCIUM: 7.9 mg/dL — AB (ref 8.4–10.5)
CO2: 21 meq/L (ref 19–32)
Chloride: 106 mEq/L (ref 96–112)
Creatinine, Ser: 0.55 mg/dL (ref 0.50–1.35)
GFR calc Af Amer: >90 mL/min (ref >90)
Glucose, Bld: 94 mg/dL (ref 70–99)
Potassium: 3.9 mEq/L (ref 3.7–5.3)
Sodium: 138 mEq/L (ref 137–147)

## 2014-01-09 LAB — PROTEIN C ACTIVITY: PROTEIN C ACTIVITY: 28 % — AB (ref 75–133)

## 2014-01-09 LAB — CBC
HEMATOCRIT: 41.1 % (ref 39.0–52.0)
HEMOGLOBIN: 14.3 g/dL (ref 13.0–17.0)
MCH: 31.4 pg (ref 26.0–34.0)
MCHC: 34.8 g/dL (ref 30.0–36.0)
MCV: 90.3 fL (ref 78.0–100.0)
Platelets: 65 10*3/uL — ABNORMAL LOW (ref 150–400)
RBC: 4.55 MIL/uL (ref 4.22–5.81)
RDW: 14.4 % (ref 11.5–15.5)
WBC: 5.3 10*3/uL (ref 4.0–10.5)

## 2014-01-09 LAB — ANA: ANA: NEGATIVE

## 2014-01-09 LAB — CARDIOLIPIN ANTIBODIES, IGG, IGM, IGA
Anticardiolipin IgA: 18 APL U/mL (ref <22)
Anticardiolipin IgG: 10 GPL U/mL — ABNORMAL LOW (ref <23)
Anticardiolipin IgM: 1 MPL U/mL — ABNORMAL LOW (ref <11)

## 2014-01-09 LAB — BETA-2-GLYCOPROTEIN I ABS, IGG/M/A
BETA 2 GLYCO I IGG: 6 G Units (ref <20)
Beta-2-Glycoprotein I IgA: 38 A Units — ABNORMAL HIGH (ref <20)
Beta-2-Glycoprotein I IgM: 12 M Units (ref <20)

## 2014-01-09 LAB — PROTEIN S ACTIVITY: Protein S Activity: 60 % — ABNORMAL LOW (ref 69–129)

## 2014-01-09 LAB — HOMOCYSTEINE: HOMOCYSTEINE-NORM: 9.3 umol/L (ref 4.0–15.4)

## 2014-01-09 LAB — PROTEIN C, TOTAL: Protein C, Total: 25 % — ABNORMAL LOW (ref 72–160)

## 2014-01-09 NOTE — Care Management Note (Signed)
    Page 1 of 2   01/10/2014     5:04:50 PM CARE MANAGEMENT NOTE 01/10/2014  Patient:  Darren Abbott, Darren Abbott   Account Number:  [id number]  Date Initiated:  01/09/2014  Documentation initiated by:  Anibal Henderson  Subjective/Objective Assessment:   Pt admitted with possible SBP, following a paracentesis. he is from home, lives alone, and will return home at D/C.     Action/Plan:   May need HH to follow after D/C. Will follow for needs   Anticipated DC Date:  01/11/2014   Anticipated DC Plan:  HOME W HOME HEALTH SERVICES      DC Planning Services  CM consult      [facility] Choice  HOME HEALTH   Choice offered to / List presented to:  C-1 Patient   DME arranged  Levan Hurst      DME agency  [facility]     [facility] arranged  HH-1 RN  HH-6 SOCIAL WORKER      [facility] agency  [facility]   Status of service:  Completed, signed off Medicare Important Message given?   (If response is "NO", the following Medicare IM given date fields will be blank) Date Medicare IM given:   Medicare IM given by:   Date Additional Medicare IM given:   Additional Medicare IM given by:    Discharge Disposition:  HOME W HOME HEALTH SERVICES  Per UR Regulation:  Reviewed for med. necessity/level of care/duration of stay  If discussed at Long Length of Stay Meetings, dates discussed:    Comments:  01/10/14 1500  Anibal Henderson RN    Pt needed PCP- Appt made with [facility], Oct 5th at 10:00am,  and number for [facility] given for transportation services. pt states he only makes $600 and a little over and rent is $500 so he can hardlyafford meds. 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 Will have CSW visit from [facility] along with RN to assist with this and other resources available 01/09/14 1650 [facility] RN/CM

## 2014-01-09 NOTE — Care Management Utilization Note (Signed)
UR completed 

## 2014-01-09 NOTE — Progress Notes (Signed)
The patient was seen and examined. His chart, vitals signs, and laboratory studies were reviewed. He was discussed with nurse practitioner, Ms. Vedia Coffer. Agree with her findings with additions below.  -Liver lesion. Etiology unclear. MRI of the abdomen was ordered for further evaluation/clarification. The underlying focal liver lesion could not be confidently identified due to to the severe motion degradation. He will likely need another limited MRI or if applicable, a liver biopsy electively. We'll discuss further with gastroenterology.  -Main portal vein thrombus. Doppler/ultrasound of the liver revealed significant amount of thrombus in the main portal vein that extends into the intrahepatic left portal vein; the main portal vein lumen is mostly filled with thrombus with some flow seen around the thrombus; no evidence of splenic vein thrombus or hepatorenal-occlusive disease. We'll continue low-dose Xarelto as started by hematology/oncology. -Studies ordered by hematology results so far reveals negative anti-cardiolipin antibodies; negative ANA; negative lupus anticoagulant; normal homocystine; low antithrombin III; low protein C/protein S. activity. Other studies pending include factor V Leiden and prothrombin gene mutation. Will await hematology followup recommendations.  -Thrombocytopenia. The patient's vitamin B12 level is high-normal. TSH is pending. Antiplatelet antibody IgG is pending. Thrombocytopenia is likely secondary to cirrhosis and splenomegaly.  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 HCV viral load is 375,758. We'll defer treatment to gastroenterology. The patient reports that he will stop drinking indefinitely.  -Recurrent ascites. He is status post another paracentesis today yielding 2.5 L of fluid. We'll continue antibiotics for total of 5 days, although there is no obvious SBP. We'll ask GI about scheduling outpatient paracentesis every 2 weeks.  -Cataracts. The  patient has difficulty seeing because of cataracts. He will need to have surgical correction sometime in the near future.  -Pulmonary nodules. These will need to be monitored with another CT or an eventual PET scan in the near future.  -Chronic anxiety. Will continue lorazepam as needed during hospitalization. He is requesting a prescription for  "a nerve pill" at the time of discharge which he says will help him with alcohol cessation.  -Psychosocial. The patient has no primary care physician. We will ask the case manager to try to acquire an appointment with a local clinic or primary care office. He will definitely need followup appointments with gastroenterology and hematology following discharge. We'll order home health RN for monitoring, Melvern Banker.

## 2014-01-09 NOTE — Progress Notes (Signed)
[facility] PROGRESS NOTE  JAMEAL RAZZANO YYT:[id number] DOB: [birth year] DOA: 01/06/2014 PCP: No PCP Per Patient  Assessment/Plan: 1. Abdominal pain, unclear etiology but likely related to chronic portal vein thrombosis per heme note.  No leukocytosis. CT reveals liver lesion, pulmonary nodules one new from 2011, ascites, spleenomegaly. MR abdomen with same. Radiology recommended liver doppler and currently awaiting results. US guided paracentesis to be done with liver doppler. Abdomen more distended and firm today. Continue empiric antibiotics. Appreciate GI assistance. Patient will likely need OP plan for scheduled paracentesis. Will discuss with GI.  Will continue pain meds.  2. Ongoing alcoholism. Will use CIWA protocol. No evidence of alcohol withdrawal. SW consult. GI opine he is candidate for liver transplant but has to stop drinking 6 months. Verbalizes interest 3. Hypertension. Creeping up somewhat. May be related to ascites. Will continue to hold BB as paracentesis scheduled to day.  4. History of epilepsy. No seizure activity. No meds noted.  5. Hep C: per GI note unknown genotype viral load. GI working up. HCV/RNA by PCR +, genotype pending. await other results.   Holding prednisolone for now per GI.  6. Colitis: question on CT. Records from [place] indicate similar finding 10/2013. No diarrhea 7. 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 May be contributing to pain. Evaluated by hemetology who opine this could be contributing to pain and recommended anticoagulation with reduced dose of Xarelto. Plan for OP follow up with Heme at [facility] for results of anemia panel, ANA, Hypercoag panel and platelet antibodies. 8. Cirrhosis: likely related to ETOH and hepatitis. Alk phos elevated and total bilirubin elevated yesterday. Records from [place] indicate paracentesis monthly since 7/15 with 3L removed each time. Also hospitalized 11/2013  hepatic encephalopathy. Appreciate GI assitance. Await GI recommendations for management. Paracentesis today. 9. Esophageal varices: per records EGD 12/14 with grade 2-3 esophageal varices s/p banding and erosive gastritis. Negative H.pylori.  10. Afib: hx of. SR on tele. Formerly on Eliquis. Unclear as to why not currently taking. See #7.  11. Pulmonary nodules: hx of same. CT chest reveal new nodule since 2011. Recommending follow up CT 1 year  12. Liver lesion: liver doppler today    Code Status: full Family Communication: none present Disposition Plan: home hopefully tomorrow. Will discuss with GI and case management plan for OP scheduled paracentesis   Consultants:  GI  Heme  Procedures:  Liver doppler 01/09/14  Paracentesis 01/09/14  Antibiotics: Rocephin 01/06/14>>01/07/14  Cefotaxime 01/07/14>> 01/08/14 Rocephin 01/08/14>>   HPI/Subjective: Awake alert requesting water. Reports less abdominal pain but complains increased "tightness"  Objective: Filed Vitals:   01/09/14 0510  BP: 127/76  Pulse: 102  Temp: 97.9 F (36.6 C)  Resp: 20    Intake/Output Summary (Last 24 hours) at 01/09/14 1110 Last data filed at 01/09/14 0509  Gross per 24 hour  Intake 620.67 ml  Output    300 ml  Net 320.67 ml   Filed Weights   01/06/14 1400 01/06/14 2028  Weight: 68.04 kg (150 lb) 71.668 kg (158 lb)    Exam:   General:  Thin chronically ill appearing   Cardiovascular: S1 and S2 no MGR No LE edema  Respiratory: normal effort BS clear bilaterally to ausculation no wheeze  Abdomen: distended firm +BS mild tenderness on right quadrants no guarding  Musculoskeletal: no clubbing or cyanosis   Data Reviewed: Basic Metabolic Panel:  Recent Labs Lab 01/06/14 1518 01/07/14 0539 01/08/14 0917 01/09/14  0520  NA 143 140 140 138  K 4.4 4.1 3.7 3.9  CL 106 108 109 106  CO2 27 24 21 21   GLUCOSE 81 102* 99 94  BUN 4* 5* 5* 6  CREATININE 0.62 0.69 0.57 0.55  CALCIUM 8.5  7.6* 7.3* 7.9*   Liver Function Tests:  Recent Labs Lab 01/06/14 1518 01/07/14 0539 01/08/14 0917  AST 71* 49* 46*  ALT 32 23 22  ALKPHOS 164* 134* 118*  BILITOT 2.7* 2.4* 2.9*  PROT 7.0 5.5* 5.4*  ALBUMIN 2.7* 2.1* 2.0*    Recent Labs Lab 01/06/14 1518 01/08/14 0832  LIPASE 71* 51   No results found for this basename: AMMONIA,  in the last 168 hours CBC:  Recent Labs Lab 01/06/14 1518 01/07/14 0539 01/08/14 0832 01/09/14 0520  WBC 4.3 5.1 4.4 5.3  NEUTROABS 2.5  --   --   --   HGB 14.6 13.5 13.7 14.3  HCT 41.2 38.8* 39.0 41.1  MCV 89.8 90.4 91.1 90.3  PLT 72* 58* 55* 65*   Cardiac Enzymes: No results found for this basename: CKTOTAL, CKMB, CKMBINDEX, TROPONINI,  in the last 168 hours BNP (last 3 results) No results found for this basename: PROBNP,  in the last 8760 hours CBG: No results found for this basename: GLUCAP,  in the last 168 hours  No results found for this or any previous visit (from the past 240 hour(s)).   Studies: Ct Chest W Contrast  01/08/2014   CLINICAL DATA:  Tobacco use. Blood clot, query occult malignancy. Long smoking history.  EXAM: CT CHEST WITH CONTRAST  TECHNIQUE: Multidetector CT imaging of the chest was performed during intravenous contrast administration.  CONTRAST:  4m OMNIPAQUE IOHEXOL 300 MG/ML  SOLN  COMPARISON:  01/06/2014  FINDINGS: Please note that today's exam was not performed using the pulmonary embolus protocol. If this was in error, [facility] and we will have the patient return for repeat imaging using CT angiogram technique. Right hilar lymph node, 0.9 cm in short axis. Subcarinal lymph node, 1.3 cm in short axis. Small AP window lymph nodes are observed. Distal esophageal wall thickening is suspected with some low-density lower paraesophageal lymph nodes and potentially some small paraesophageal varices.  Hepatic cirrhosis is observed with diffuse mesenteric and omental edema with  slight associated nodularity as reported on prior CT abdomen. Perihepatic ascites noted. Splenomegaly is partially shown. In the part of the liver that we included, there is a 1.1 x 0.8 cm arterial phase enhancing nodule posteriorly in segment 6 of the liver, image 60 series 2.  Centrilobular emphysema. 5 x 3 mm right upper lobe nodule, image 24 series 3, no change from 03/01/2010. 3 mm triangular-shaped nodule, right upper lobe, image 29 series 3, not readily seen on the prior chest CT. 0.5 cm subpleural lymph node along the left major fissure, image 24 series 3, no change from 03/01/2010. Dependent subsegmental atelectasis or scarring is present in both lower lobes.  Left clavicular plate and screw fixator. Thoracic spondylosis without malalignment.  IMPRESSION: 1. Arterial phase enhancing 1.1 x 0.8 cm lesion in segment 6 of the liver in this patient with cirrhosis. I cannot exclude a small hepatic cellular carcinoma. Consider hepatic protocol MRI with and without contrast for confirmation and assessment. 2. No lung cancer is observed. There is several small pulmonary nodules in the 5 mm range which are stable from 2011 and considered benign. There is a 3 mm triangular shaped nodule  in the right upper lobe which I do not see on the prior 2011 exam in which it probably be followed. I would suggest a followup chest CT in 1 years time given that the patient does appear to have risk factors for malignancy. This recommendation follows the consensus statement: Guidelines for Management of Small Pulmonary Nodules Detected on CT Scans: A Statement from the [place] as published in Radiology 2005; 237:395-400. 3. Centrilobular emphysema. 4. Upper abdominal ascites and mesenteric and omental edema/infiltration. 5. Distal esophageal wall thickening -esophagitis is not excluded. 6. Splenomegaly. 7. Please note that today's exam was not performed using the pulmonary embolus protocol. If this was in error, please  alert the [facility] Radiology Department and we will have the patient return for repeat imaging using CT angiogram technique.   Electronically Signed   By: Sherryl Barters M.D.   On: 01/08/2014 09:34   Mr Abdomen W Wo Contrast  01/08/2014   CLINICAL DATA:  Evaluate liver lesion on CT. Cirrhosis with portal vein thrombosis.  EXAM: MRI ABDOMEN WITHOUT AND WITH CONTRAST  TECHNIQUE: Multiplanar multisequence MR imaging of the abdomen was performed both before and after the administration of intravenous contrast.  CONTRAST:  72m MULTIHANCE GADOBENATE DIMEGLUMINE 529 MG/ML IV SOLN  COMPARISON:  CT chest dated 01/08/2014. CT abdomen pelvis dated 01/06/2014. CT abdomen pelvis dated 12/30/2013.  FINDINGS: Severely motion degraded images.  Cirrhotic configuration of the liver. Heterogeneous perfusion involving the posterior hepatic dome (series 5010/ image 26), nonspecific. Inferiorly in the posterior right hepatic lobe, in the region of the CT abnormality, an underlying focal lesion cannot be confidently identified due to severe motion degradation (series 5010/image 40). No definite corresponding restricted diffusion.  Splenomegaly, measuring 14.1 cm.  Pancreas and adrenal glands are grossly unremarkable.  Gallbladder is mildly thick-walled but underdistended (series 8/ image 33), without definite cholelithiasis. No intrahepatic or extrahepatic ductal dilatation.  Kidneys are within normal limits.  No hydronephrosis.  Moderate abdominal ascites.  Portal vein thrombosis (series 5011/ image 40), although better evaluated on CT. Gastroesophageal varices.  No focal osseous lesions.  IMPRESSION: Markedly limited evaluation with severely motion degraded images.  Heterogeneous perfusion involving the posterior hepatic dome. Enhancing lesion inferiorly in the posterior right hepatic lobe on CT cannot be confidently identified on MR. Further evaluation in the acute setting is of questionable clinical utility. Consider follow-up  triple phase CT or repeat MR abdomen with/without contrast as an outpatient.  Portal vein thrombosis.  Splenomegaly.  Gastroesophageal varices.  Moderate abdominal ascites.   Electronically Signed   By: SJulian HyM.D.   On: 01/08/2014 18:09    Scheduled Meds: . cefTRIAXone (ROCEPHIN)  IV  2 g Intravenous Q24H  . folic acid  1 mg Oral Daily  . Influenza vac split quadrivalent PF  0.5 mL Intramuscular Tomorrow-1000  . lactulose  20 g Oral BID  . multivitamin with minerals  1 tablet Oral Daily  . pantoprazole  40 mg Oral BID AC  . Rivaroxaban  15 mg Oral Q breakfast  . sodium chloride  3 mL Intravenous Q12H  . thiamine  100 mg Oral Daily   Or  . thiamine  100 mg Intravenous Daily   Continuous Infusions: . sodium chloride 10 mL/hr at 01/08/14 1331    Active Problems:   Atrial fibrillation   Unspecified epilepsy without mention of intractable epilepsy   Unspecified essential hypertension   Abdominal pain   Ascites   Hepatitis C   Thrombus   Esophageal  varices   Cirrhosis   Thrombocytopenia, unspecified   Pulmonary nodules   Liver lesion    Time spent: 35 minutes    [facility] Pager [phone]. If 7PM-7AM, please contact night-coverage at [email or web address], password [facility] 01/09/2014, 11:10 AM  LOS: 3 days

## 2014-01-09 NOTE — Progress Notes (Signed)
Paracentesis complete no signs of distress. 2500ml amber colored ascites removed.  

## 2014-01-09 NOTE — Progress Notes (Signed)
Subjective:  C/o ruq pain. 3-4 BMs yesterday. One soft stool today. On lactulose. Tolerated advanced diet this morning for breakfast.  Objective: Vital signs in last 24 hours: Temp:  [97.9 F (36.6 C)-98.5 F (36.9 C)] 97.9 F (36.6 C) (09/17 0510) Pulse Rate:  [79-102] 102 (09/17 0510) Resp:  [20] 20 (09/17 0510) BP: (99-127)/(62-90) 127/76 mmHg (09/17 0510) SpO2:  [94 %-99 %] 97 % (09/17 0510) Last BM Date: 01/09/14 General:   Alert,  Chronically-ill appearing. pleasant and cooperative in NAD Head:  Normocephalic and atraumatic. Eyes:  Sclera clear, no icterus.  Abdomen:  Soft, ruq tenderness. Some distention but not tense. Normal bowel sounds, without guarding, and without rebound.   Extremities:  Without clubbing, deformity or edema. Neurologic:  Alert and  oriented x4;  grossly normal neurologically. Skin:  Intact without significant lesions or rashes. Psych:  Alert and cooperative. Normal mood and affect.  Intake/Output from previous day: 09/16 0701 - 09/17 0700 In: 620.7 [I.V.:420.7; IV Piggyback:200] Out: 300 [Urine:300] Intake/Output this shift:    Lab Results: CBC  Recent Labs  01/07/14 0539 01/08/14 0832 01/09/14 0520  WBC 5.1 4.4 5.3  HGB 13.5 13.7 14.3  HCT 38.8* 39.0 41.1  MCV 90.4 91.1 90.3  PLT 58* 55* 65*   BMET  Recent Labs  01/07/14 0539 01/08/14 0917 01/09/14 0520  NA 140 140 138  K 4.1 3.7 3.9  CL 108 109 106  CO2 GLUCOSE 102* 99 94  BUN 5* 5* 6  CREATININE 0.69 0.57 0.55  CALCIUM 7.6* 7.3* 7.9*   LFTs  Recent Labs  01/06/14 1518 01/07/14 0539 01/08/14 0917  BILITOT 2.7* 2.4* 2.9*  ALKPHOS 164* 134* 118*  AST 71* 49* 46*  ALT 32 23 22  PROT 7.0 5.5* 5.4*  ALBUMIN 2.7* 2.1* 2.0*    Recent Labs  01/06/14 1518 01/08/14 0832  LIPASE 71* 51   PT/INR  Recent Labs  01/06/14 1519 01/07/14 0539 01/08/14 0917  LABPROT 17.1* 18.4* 18.6*  INR 1.39 1.53* 1.55*      Imaging Studies: Ct Chest W  Contrast  01/08/2014   CLINICAL DATA:  Tobacco use. Blood clot, query occult malignancy. Long smoking history.  EXAM: CT CHEST WITH CONTRAST  TECHNIQUE: Multidetector CT imaging of the chest was performed during intravenous contrast administration.  CONTRAST:  80mL OMNIPAQUE IOHEXOL 300 MG/ML  SOLN  COMPARISON:  01/06/2014  FINDINGS: Please note that today's exam was not performed using the pulmonary embolus protocol. If this was in error, please alert the [facility] Radiology Department and we will have the patient return for repeat imaging using CT angiogram technique. Right hilar lymph node, 0.9 cm in short axis. Subcarinal lymph node, 1.3 cm in short axis. Small AP window lymph nodes are observed. Distal esophageal wall thickening is suspected with some low-density lower paraesophageal lymph nodes and potentially some small paraesophageal varices.  Hepatic cirrhosis is observed with diffuse mesenteric and omental edema with slight associated nodularity as reported on prior CT abdomen. Perihepatic ascites noted. Splenomegaly is partially shown. In the part of the liver that we included, there is a 1.1 x 0.8 cm arterial phase enhancing nodule posteriorly in segment 6 of the liver, image 60 series 2.  Centrilobular emphysema. 5 x 3 mm right upper lobe nodule, image 24 series 3, no change from 03/01/2010. 3 mm triangular-shaped nodule, right upper lobe, image 29 series 3, not readily seen on the prior chest CT. 0.5 cm subpleural lymph node along  the left major fissure, image 24 series 3, no change from 03/01/2010. Dependent subsegmental atelectasis or scarring is present in both lower lobes.  Left clavicular plate and screw fixator. Thoracic spondylosis without malalignment.  IMPRESSION: 1. Arterial phase enhancing 1.1 x 0.8 cm lesion in segment 6 of the liver in this patient with cirrhosis. I cannot exclude a small hepatic cellular carcinoma. Consider hepatic protocol MRI with and without contrast for confirmation  and assessment. 2. No lung cancer is observed. There is several small pulmonary nodules in the 5 mm range which are stable from 2011 and considered benign. There is a 3 mm triangular shaped nodule in the right upper lobe which I do not see on the prior 2011 exam in which it probably be followed. I would suggest a followup chest CT in 1 years time given that the patient does appear to have risk factors for malignancy. This recommendation follows the consensus statement: Guidelines for Management of Small Pulmonary Nodules Detected on CT Scans: A Statement from the Fleischner Society as published in Radiology 2005; 237:395-400. 3. Centrilobular emphysema. 4. Upper abdominal ascites and mesenteric and omental edema/infiltration. 5. Distal esophageal wall thickening -esophagitis is not excluded. 6. Splenomegaly. 7. Please note that today's exam was not performed using the pulmonary embolus protocol. If this was in error, please alert the [facility] Radiology Department and we will have the patient return for repeat imaging using CT angiogram technique.   Electronically Signed   By: Herbie Baltimore M.D.   On: 01/08/2014 09:34   Mr Abdomen W Wo Contrast  01/08/2014   CLINICAL DATA:  Evaluate liver lesion on CT. Cirrhosis with portal vein thrombosis.  EXAM: MRI ABDOMEN WITHOUT AND WITH CONTRAST  TECHNIQUE: Multiplanar multisequence MR imaging of the abdomen was performed both before and after the administration of intravenous contrast.  CONTRAST:  14mL MULTIHANCE GADOBENATE DIMEGLUMINE 529 MG/ML IV SOLN  COMPARISON:  CT chest dated 01/08/2014. CT abdomen pelvis dated 01/06/2014. CT abdomen pelvis dated 12/30/2013.  FINDINGS: Severely motion degraded images.  Cirrhotic configuration of the liver. Heterogeneous perfusion involving the posterior hepatic dome (series 5010/ image 26), nonspecific. Inferiorly in the posterior right hepatic lobe, in the region of the CT abnormality, an underlying focal lesion cannot be  confidently identified due to severe motion degradation (series 5010/image 40). No definite corresponding restricted diffusion.  Splenomegaly, measuring 14.1 cm.  Pancreas and adrenal glands are grossly unremarkable.  Gallbladder is mildly thick-walled but underdistended (series 8/ image 33), without definite cholelithiasis. No intrahepatic or extrahepatic ductal dilatation.  Kidneys are within normal limits.  No hydronephrosis.  Moderate abdominal ascites.  Portal vein thrombosis (series 5011/ image 40), although better evaluated on CT. Gastroesophageal varices.  No focal osseous lesions.  IMPRESSION: Markedly limited evaluation with severely motion degraded images.  Heterogeneous perfusion involving the posterior hepatic dome. Enhancing lesion inferiorly in the posterior right hepatic lobe on CT cannot be confidently identified on MR. Further evaluation in the acute setting is of questionable clinical utility. Consider follow-up triple phase CT or repeat MR abdomen with/without contrast as an outpatient.  Portal vein thrombosis.  Splenomegaly.  Gastroesophageal varices.  Moderate abdominal ascites.   Electronically Signed   By: Charline Bills M.D.   On: 01/08/2014 18:09   Ct Abdomen Pelvis W Contrast  01/06/2014   CLINICAL DATA:  Abdominal pain right upper quadrant. Paracentesis 1-1/2 weeks ago.  EXAM: CT ABDOMEN AND PELVIS WITH CONTRAST  TECHNIQUE: Multidetector CT imaging of the abdomen and pelvis was performed  using the standard protocol following bolus administration of intravenous contrast.  CONTRAST:  OMNIPAQUE IOHEXOL 300 MG/ML  SOLN  COMPARISON:  12/30/2013, 11/18/2013 and 09/14/2008  FINDINGS: Lung bases are unremarkable.  Abdominal images demonstrate a small liver with nodular contour or unchanged. There is mild splenomegaly. Findings are compatible with cirrhosis. There is no change in chronic thrombus over the junction of the splenic vein to portal vein and throughout the main portal vein  and left portal vein. There is moderate ascites with slight interval improvement. Gallbladder is contracted. The pancreas and adrenal glands are within normal. There are a few small nonspecific lymph nodes over the upper abdomen and periaortic region without significant change. Kidneys normal in size without hydronephrosis or nephrolithiasis. There is a sub cm hypodensity over the upper pole of the right kidney unchanged and likely a cyst, but too small to characterize. There is calcified plaque over the abdominal aorta and iliac arteries. Appendix is within normal.  There is mild prominence of several proximal jejunal loops without significant change. There is diverticulosis of the colon. There is wall thickening of the ascending colon which may be related to patient's liver failure and hypoproteinemia, although cannot exclude an acute colitis.  Pelvic images demonstrate a normal rectum, bladder and prostate. Stable L3 and L4 compression fractures. Degenerative changes of the spine and hips.  IMPRESSION: Continued evidence of cirrhosis with portal venous hypertension and moderate ascites. Ascites is slightly improved compared to the previous exam. 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  Mild wall thickening of the ascending colon which may be secondary to patient's cirrhosis with hypoproteinemia, although cannot exclude an acute colitis.  Chronic prominence of the proximal jejunal loops.  Stable the L3 and L4 compression fractures.   Electronically Signed   By: Elberta Fortis M.D.   On: 01/06/2014 20:05   Dg Chest Port 1 View  01/06/2014   CLINICAL DATA:  Shortness of breath, smoker, hypertension, abdominal pain  EXAM: PORTABLE CHEST - 1 VIEW  COMPARISON:  12/17/2013  FINDINGS: Left clavicular hardware. Bilateral AC joint degenerative change noted. Heart size is normal. Curvilinear left lower lobe atelectasis or scarring. Lungs are  hypoaerated with crowding of the bronchovascular markings. No focal pulmonary opacity otherwise. No pleural effusion.  IMPRESSION: Low lung volumes bilaterally with crowding of the bronchovascular markings and left lower lobe atelectasis.   Electronically Signed   By: Christiana Pellant M.D.   On: 01/06/2014 15:39   US Abdomen Limited Ruq  01/06/2014   CLINICAL DATA:  Ascites.  Cirrhosis and hepatitis.  EXAM: LIMITED ABDOMEN ULTRASOUND FOR ASCITES  TECHNIQUE: Limited ultrasound survey for ascites was performed in all four abdominal quadrants.  COMPARISON:  Multiple exams, including 12/31/2013 and 12/30/2013  FINDINGS: Small amount of ascites noted in the right upper quadrant, but not enough to provide a therapeutic benefit if drained. Accordingly, paracentesis was not performed.  IMPRESSION: 1. Small amount of ascites in the right upper quadrant, not judged to provide a significant therapeutic benefit if drained.   Electronically Signed   By: Herbie Baltimore M.D.   On: 01/06/2014 16:42  [2 weeks]   Assessment: 58 year old male with history of ETOH abuse, Hep C, cirrhosis, presenting with abdominal pain. MELD 15. Discriminant function 17-33 on admission in setting of mildly elevated transaminases. HCV RNA by PCR positive, genotype pending. Prednisolone on hold as benefits of steroid therapy may not outweigh the risks.  Continue antibiotics for empiric treatment of possible SBP; CT showing ascites but ultrasound with small amount in RUQ. Prior records from [facility] reviewed, with last paracentesis around 9/8 of 3 liters. Records stating negative fluid analysis for SBP. EGD in Dec 2014 with grade 2-3 esophageal varices s/p banding X 5. Appears did not have early interval EGD for reassessment; also appears was not maintained on non-selective beta-blocker for prophylaxis.   Chronic thrombus on CT within the junction of the splenic portal vein and throughout the main portal vein and left portal vein. Possible  contributor to abdominal pain. Appreciate Hematology input and recommendations. Low-dose Xarelto recommended.   Question of colitis on CT; likely secondary to hypoalbuminemia. Similar finding on CT in July 2015.   Plan: -ETOH cessation with liver transplant evaluation as outpatient  -EGD for variceal surveillance to be considered at a minimum 3 months from now. Will need to coordinate with hematology due to Xarelto (discussed with Dr. Darrick Penna).  -Hold on non-selective beta-blocker due to possible SBP  -continue IV antibiotics for total of 5 days. -Hold on prednisolone. -Urged etoh cessation. -F/U pending u/s doppler study.    LOS: 3 days   Tana Coast  01/09/2014, 8:38 AM

## 2014-01-10 ENCOUNTER — Telehealth: Payer: Self-pay | Admitting: Gastroenterology

## 2014-01-10 DIAGNOSIS — K7460 Unspecified cirrhosis of liver: Secondary | ICD-10-CM

## 2014-01-10 DIAGNOSIS — I749 Embolism and thrombosis of unspecified artery: Secondary | ICD-10-CM

## 2014-01-10 DIAGNOSIS — K652 Spontaneous bacterial peritonitis: Secondary | ICD-10-CM

## 2014-01-10 LAB — CBC
HCT: 35.4 % — ABNORMAL LOW (ref 39.0–52.0)
Hemoglobin: 12.4 g/dL — ABNORMAL LOW (ref 13.0–17.0)
MCH: 31.9 pg (ref 26.0–34.0)
MCHC: 35 g/dL (ref 30.0–36.0)
MCV: 91 fL (ref 78.0–100.0)
PLATELETS: 54 10*3/uL — AB (ref 150–400)
RBC: 3.89 MIL/uL — ABNORMAL LOW (ref 4.22–5.81)
RDW: 14.3 % (ref 11.5–15.5)
WBC: 4 10*3/uL (ref 4.0–10.5)

## 2014-01-10 LAB — COMPREHENSIVE METABOLIC PANEL
ALK PHOS: 109 U/L (ref 39–117)
ALT: 21 U/L (ref 0–53)
AST: 44 U/L — ABNORMAL HIGH (ref 0–37)
Albumin: 2 g/dL — ABNORMAL LOW (ref 3.5–5.2)
Anion gap: 6 (ref 5–15)
BUN: 6 mg/dL (ref 6–23)
CALCIUM: 7.5 mg/dL — AB (ref 8.4–10.5)
CO2: 27 mEq/L (ref 19–32)
Chloride: 104 mEq/L (ref 96–112)
Creatinine, Ser: 0.59 mg/dL (ref 0.50–1.35)
GLUCOSE: 83 mg/dL (ref 70–99)
POTASSIUM: 4.2 meq/L (ref 3.7–5.3)
Sodium: 137 mEq/L (ref 137–147)
TOTAL PROTEIN: 5.4 g/dL — AB (ref 6.0–8.3)
Total Bilirubin: 2.7 mg/dL — ABNORMAL HIGH (ref 0.3–1.2)

## 2014-01-10 LAB — TSH: TSH: 2.93 u[IU]/mL (ref 0.350–4.500)

## 2014-01-10 LAB — HEPATITIS A ANTIBODY, TOTAL: Hep A Total Ab: NONREACTIVE

## 2014-01-10 LAB — HEPATITIS B SURFACE ANTIBODY,QUALITATIVE: Hep B S Ab: NEGATIVE

## 2014-01-10 LAB — HEPATITIS C GENOTYPE

## 2014-01-10 MED ORDER — OXAZEPAM 15 MG PO CAPS: 15.0000 mg | ORAL_CAPSULE | Freq: Three times a day (TID) | ORAL | Status: DC | PRN

## 2014-01-10 MED ORDER — RIVAROXABAN 15 MG PO TABS: 15.0000 mg | ORAL_TABLET | Freq: Every day | ORAL | Status: AC

## 2014-01-10 MED ORDER — CEFUROXIME AXETIL 500 MG PO TABS: 500.0000 mg | ORAL_TABLET | Freq: Two times a day (BID) | ORAL | Status: DC

## 2014-01-10 MED ORDER — METOPROLOL TARTRATE 50 MG PO TABS: 50.0000 mg | ORAL_TABLET | Freq: Two times a day (BID) | ORAL | Status: DC

## 2014-01-10 MED ORDER — FOLIC ACID 1 MG PO TABS: 1.0000 mg | ORAL_TABLET | Freq: Every day | ORAL | Status: AC

## 2014-01-10 MED ORDER — ALPRAZOLAM 0.5 MG PO TABS: 0.5000 mg | ORAL_TABLET | Freq: Every evening | ORAL | Status: DC | PRN

## 2014-01-10 MED ORDER — LACTULOSE 20 GM/30ML PO SOLN: 20.0000 g | Freq: Two times a day (BID) | ORAL | Status: AC

## 2014-01-10 MED ORDER — METOPROLOL TARTRATE 50 MG PO TABS: 25.0000 mg | ORAL_TABLET | Freq: Every day | ORAL | Status: AC

## 2014-01-10 MED ORDER — METOPROLOL TARTRATE 50 MG PO TABS: 25.0000 mg | ORAL_TABLET | Freq: Two times a day (BID) | ORAL | Status: DC

## 2014-01-10 MED ORDER — PANTOPRAZOLE SODIUM 40 MG PO TBEC: 40.0000 mg | DELAYED_RELEASE_TABLET | Freq: Two times a day (BID) | ORAL | Status: AC

## 2014-01-10 MED ORDER — OXAZEPAM 15 MG PO CAPS: 15.0000 mg | ORAL_CAPSULE | Freq: Two times a day (BID) | ORAL | Status: DC | PRN

## 2014-01-10 MED ORDER — OXYCODONE HCL 5 MG PO TABS: 5.0000 mg | ORAL_TABLET | ORAL | Status: DC | PRN

## 2014-01-10 NOTE — Evaluation (Signed)
Physical Therapy Evaluation Patient Details Name: Darren Abbott MRN: [id number] DOB: Nov 04, 1955 Today's Date: 01/10/2014   History of Present Illness  Pt is  a 58 year old male admitted with abdominal pain since a paracentesis recently.  He has a hx of ETOH abuse, epilepsy, Hepatitis C cirrhosis.  The current abdominal pain is suspected to be due to a portal vein thrombosis.  He has bilateral cataracts and decreased vision.  Pt lives alone with minimal family support.  He has been reasonably independent with ADLs.  He had been ambulating with a cane but has lost it.  He reports difficulty walking and very poor endurance.  Clinical Impression   Pt was seen for evaluation and found to be generally deconditioned. He does now require a walker for safe gait and gait distance is limited by poor endurance.  I had hoped that he could receive some HHPT but Medicaid will not provide for this.  He will need a walker ordered prior to discharge.  He plans to discharge this afternoon.    Follow Up Recommendations Home health PT    Equipment Recommendations    rolling walker   Recommendations for Other Services    none   Precautions / Restrictions Precautions Precautions: Fall Restrictions Weight Bearing Restrictions: No      Mobility  Bed Mobility Overal bed mobility: Independent                Transfers Overall transfer level: Independent                  Ambulation/Gait Ambulation/Gait assistance: Modified independent (Device/Increase time) Ambulation Distance (Feet): 60 Feet Assistive device: Rolling walker (2 wheeled) Gait Pattern/deviations: WFL(Within Functional Limits)   Gait velocity interpretation: Below normal speed for age/gender    Stairs            Wheelchair Mobility    Modified Rankin (Stroke Patients Only)       Balance Overall balance assessment: No apparent balance deficits (not formally assessed)                                            Pertinent Vitals/Pain Pain Assessment: No/denies pain    Home Living Family/patient expects to be discharged to:: Private residence Living Arrangements: Alone Available Help at Discharge: Family;Available PRN/intermittently Type of Home: Apartment Home Access: Stairs to enter Entrance Stairs-Rails: Right Entrance Stairs-Number of Steps: 3 Home Layout: One level Home Equipment: None      Prior Function Level of Independence: Independent               Hand Dominance   Dominant Hand: Right    Extremity/Trunk Assessment               Lower Extremity Assessment: Generalized weakness      Cervical / Trunk Assessment: Normal  Communication   Communication: No difficulties  Cognition Arousal/Alertness: Awake/alert Behavior During Therapy: WFL for tasks assessed/performed Overall Cognitive Status: Within Functional Limits for tasks assessed                      General Comments      Exercises        Assessment/Plan    PT Assessment All further PT needs can be met in the next venue of care  PT Diagnosis Generalized weakness;Difficulty walking   PT Problem List Decreased  strength;Decreased activity tolerance;Decreased mobility  PT Treatment Interventions     PT Goals (Current goals can be found in the Care Plan section) Acute Rehab PT Goals PT Goal Formulation: No goals set, d/c therapy    Frequency     Barriers to discharge        Co-evaluation               End of Session Equipment Utilized During Treatment: Gait belt Activity Tolerance: Patient tolerated treatment well Patient left: in bed;with call bell/phone within reach;with bed alarm set           Time: 2883-3744 PT Time Calculation (min): 22 min   Charges:   PT Evaluation $Initial PT Evaluation Tier I: 1 Procedure     PT G CodesDemetrios Isaacs L 01/10/2014, 11:28 AM

## 2014-01-10 NOTE — Progress Notes (Signed)
Quick Note:  Patient has upcoming appt. He will need to have Hep A and B vaccinations. ______

## 2014-01-10 NOTE — Progress Notes (Signed)
Patient with orders to be discharge home. Discharge instruction given, patient verbalized understanding. Prescriptions given. Patient stable. Patient left in private vehicle with family.

## 2014-01-10 NOTE — Progress Notes (Addendum)
REVIEWED. AGREE. NO ADDITIONAL RECOMMENDATIONS. ABX complete TODAY. Now on Xarelto. ABD PAIN MOST LIKE DUE TO VENOUS THROMBOSIS/MESENTERIC ISCHEMIA. CONSIDER NSBB IN THE FUTURE. OPV IN 6 WEEKS E30 CIRRHOSIS/PORTAL VEIN THROMBOSIS.

## 2014-01-10 NOTE — Telephone Encounter (Signed)
PT AWARE OF APPT. INFO GIVEN AT DISCHARGE. PLEASE CALL PT WITH APPT REMINDER 1 WEEK PRIOR. PHONE NUMBERS ARE UP TO DATE.

## 2014-01-10 NOTE — Progress Notes (Signed)
Subjective:  Feels better. Less pain.   Objective: Vital signs in last 24 hours: Temp:  [98.1 F (36.7 C)-98.7 F (37.1 C)] 98.5 F (36.9 C) (09/18 0604) Pulse Rate:  [88-95] 88 (09/18 0604) Resp:  [16-24] 22 (09/18 0604) BP: (92-114)/(56-78) 97/64 mmHg (09/18 0604) SpO2:  [95 %-99 %] 96 % (09/18 0604) Last BM Date: 01/10/14 General:   Alert,  Well-developed, well-nourished, pleasant and cooperative in NAD Head:  Normocephalic and atraumatic. Eyes:  Sclera clear, no icterus.   Abdomen:  Soft, mild ruq tenderness, less distention.  Normal bowel sounds, without guarding, and without rebound.   Extremities:  Without clubbing, deformity or edema. Neurologic:  Alert and  oriented x4;  grossly normal neurologically. Skin:  Intact without significant lesions or rashes. Psych:  Alert and cooperative. Normal mood and affect.  Intake/Output from previous day: 09/17 0701 - 09/18 0700 In: 596.2 [P.O.:240; I.V.:356.2] Out: -  Intake/Output this shift: Total I/O In: 240 [P.O.:240] Out: -   Lab Results: CBC  Recent Labs  01/08/14 0832 01/09/14 0520 01/10/14 0708  WBC 4.4 5.3 4.0  HGB 13.7 14.3 12.4*  HCT 39.0 41.1 35.4*  MCV 91.1 90.3 91.0  PLT 55* 65* 54*   BMET  Recent Labs  01/08/14 0917 01/09/14 0520 01/10/14 0708  NA 140 138 137  K 3.7 3.9 4.2  CL 109 106 104  CO2 21 21 27   GLUCOSE 99 94 83  BUN 5* 6 6  CREATININE 0.57 0.55 0.59  CALCIUM 7.3* 7.9* 7.5*   LFTs  Recent Labs  01/08/14 0917 01/10/14 0708  BILITOT 2.9* 2.7*  ALKPHOS 118* 109  AST 46* 44*  ALT 22 21  PROT 5.4* 5.4*  ALBUMIN 2.0* 2.0*    Recent Labs  01/08/14 0832  LIPASE 51   PT/INR  Recent Labs  01/08/14 0917  LABPROT 18.6*  INR 1.55*      Imaging Studies: Ct Chest W Contrast  01/08/2014   CLINICAL DATA:  Tobacco use. Blood clot, query occult malignancy. Long smoking history.  EXAM: CT CHEST WITH CONTRAST  TECHNIQUE: Multidetector CT imaging of the chest was performed  during intravenous contrast administration.  CONTRAST:  80mL OMNIPAQUE IOHEXOL 300 MG/ML  SOLN  COMPARISON:  01/06/2014  FINDINGS: Please note that today's exam was not performed using the pulmonary embolus protocol. If this was in error, please alert the [facility] Radiology Department and we will have the patient return for repeat imaging using CT angiogram technique. Right hilar lymph node, 0.9 cm in short axis. Subcarinal lymph node, 1.3 cm in short axis. Small AP window lymph nodes are observed. Distal esophageal wall thickening is suspected with some low-density lower paraesophageal lymph nodes and potentially some small paraesophageal varices.  Hepatic cirrhosis is observed with diffuse mesenteric and omental edema with slight associated nodularity as reported on prior CT abdomen. Perihepatic ascites noted. Splenomegaly is partially shown. In the part of the liver that we included, there is a 1.1 x 0.8 cm arterial phase enhancing nodule posteriorly in segment 6 of the liver, image 60 series 2.  Centrilobular emphysema. 5 x 3 mm right upper lobe nodule, image 24 series 3, no change from 03/01/2010. 3 mm triangular-shaped nodule, right upper lobe, image 29 series 3, not readily seen on the prior chest CT. 0.5 cm subpleural lymph node along the left major fissure, image 24 series 3, no change from 03/01/2010. Dependent subsegmental atelectasis or scarring is present in both lower lobes.  Left clavicular plate and  screw fixator. Thoracic spondylosis without malalignment.  IMPRESSION: 1. Arterial phase enhancing 1.1 x 0.8 cm lesion in segment 6 of the liver in this patient with cirrhosis. I cannot exclude a small hepatic cellular carcinoma. Consider hepatic protocol MRI with and without contrast for confirmation and assessment. 2. No lung cancer is observed. There is several small pulmonary nodules in the 5 mm range which are stable from 2011 and considered benign. There is a 3 mm triangular shaped nodule in the  right upper lobe which I do not see on the prior 2011 exam in which it probably be followed. I would suggest a followup chest CT in 1 years time given that the patient does appear to have risk factors for malignancy. This recommendation follows the consensus statement: Guidelines for Management of Small Pulmonary Nodules Detected on CT Scans: A Statement from the Fleischner Society as published in Radiology 2005; 237:395-400. 3. Centrilobular emphysema. 4. Upper abdominal ascites and mesenteric and omental edema/infiltration. 5. Distal esophageal wall thickening -esophagitis is not excluded. 6. Splenomegaly. 7. Please note that today's exam was not performed using the pulmonary embolus protocol. If this was in error, please alert the [facility] Radiology Department and we will have the patient return for repeat imaging using CT angiogram technique.   Electronically Signed   By: Herbie Baltimore M.D.   On: 01/08/2014 09:34   Mr Abdomen W Wo Contrast  01/08/2014   CLINICAL DATA:  Evaluate liver lesion on CT. Cirrhosis with portal vein thrombosis.  EXAM: MRI ABDOMEN WITHOUT AND WITH CONTRAST  TECHNIQUE: Multiplanar multisequence MR imaging of the abdomen was performed both before and after the administration of intravenous contrast.  CONTRAST:  14mL MULTIHANCE GADOBENATE DIMEGLUMINE 529 MG/ML IV SOLN  COMPARISON:  CT chest dated 01/08/2014. CT abdomen pelvis dated 01/06/2014. CT abdomen pelvis dated 12/30/2013.  FINDINGS: Severely motion degraded images.  Cirrhotic configuration of the liver. Heterogeneous perfusion involving the posterior hepatic dome (series 5010/ image 26), nonspecific. Inferiorly in the posterior right hepatic lobe, in the region of the CT abnormality, an underlying focal lesion cannot be confidently identified due to severe motion degradation (series 5010/image 40). No definite corresponding restricted diffusion.  Splenomegaly, measuring 14.1 cm.  Pancreas and adrenal glands are grossly  unremarkable.  Gallbladder is mildly thick-walled but underdistended (series 8/ image 33), without definite cholelithiasis. No intrahepatic or extrahepatic ductal dilatation.  Kidneys are within normal limits.  No hydronephrosis.  Moderate abdominal ascites.  Portal vein thrombosis (series 5011/ image 40), although better evaluated on CT. Gastroesophageal varices.  No focal osseous lesions.  IMPRESSION: Markedly limited evaluation with severely motion degraded images.  Heterogeneous perfusion involving the posterior hepatic dome. Enhancing lesion inferiorly in the posterior right hepatic lobe on CT cannot be confidently identified on MR. Further evaluation in the acute setting is of questionable clinical utility. Consider follow-up triple phase CT or repeat MR abdomen with/without contrast as an outpatient.  Portal vein thrombosis.  Splenomegaly.  Gastroesophageal varices.  Moderate abdominal ascites.   Electronically Signed   By: Charline Bills M.D.   On: 01/08/2014 18:09   US Abdomen Complete  01/09/2014   CLINICAL DATA:  Portal venous thrombosis.  EXAM: ULTRASOUND ABDOMEN COMPLETE  COMPARISON:  None.  FINDINGS: Gallbladder:  No gallstones are noted. Gallbladder wall thickness is noted measured at 7.5 mm most likely due to surrounding ascites or adjacent hepatocellular disease. No sonographic Murphy's sign is noted.  Common bile duct:  Diameter: 6.7 mm which is within normal limits.  Liver:  Nodular contours of of hepatic parenchyma are noted consistent with hepatic cirrhosis. No definite focal abnormality seen.  IVC:  No abnormality visualized.  Pancreas:  Not visualized due to overlying bowel gas.  Spleen:  Has a maximum measured diameter 14.3 cm with calculated volume of 895 cubic cm consistent with moderate splenomegaly.  Right Kidney:  Length: 13.6 cm. Echogenicity within normal limits. No mass or hydronephrosis visualized.  Left Kidney:  Length: 13.7 cm. Echogenicity within normal limits. No mass or  hydronephrosis visualized.  Abdominal aorta:  Not visualized due to overlying bowel gas.  Other findings:  Moderate ascites is noted.  IMPRESSION: Moderate ascites is noted. Hepatic cirrhosis with associated splenomegaly. Gallbladder wall thickening is noted without cholelithiasis; this most likely is due to surrounding ascites or adjacent hepatocellular disease.   Electronically Signed   By: Roque Lias M.D.   On: 01/09/2014 16:16   Ct Abdomen Pelvis W Contrast  01/06/2014   CLINICAL DATA:  Abdominal pain right upper quadrant. Paracentesis 1-1/2 weeks ago.  EXAM: CT ABDOMEN AND PELVIS WITH CONTRAST  TECHNIQUE: Multidetector CT imaging of the abdomen and pelvis was performed using the standard protocol following bolus administration of intravenous contrast.  CONTRAST:  OMNIPAQUE IOHEXOL 300 MG/ML  SOLN  COMPARISON:  12/30/2013, 11/18/2013 and 09/14/2008  FINDINGS: Lung bases are unremarkable.  Abdominal images demonstrate a small liver with nodular contour or unchanged. There is mild splenomegaly. Findings are compatible with cirrhosis. There is no change in chronic thrombus over the junction of the splenic vein to portal vein and throughout the main portal vein and left portal vein. There is moderate ascites with slight interval improvement. Gallbladder is contracted. The pancreas and adrenal glands are within normal. There are a few small nonspecific lymph nodes over the upper abdomen and periaortic region without significant change. Kidneys normal in size without hydronephrosis or nephrolithiasis. There is a sub cm hypodensity over the upper pole of the right kidney unchanged and likely a cyst, but too small to characterize. There is calcified plaque over the abdominal aorta and iliac arteries. Appendix is within normal.  There is mild prominence of several proximal jejunal loops without significant change. There is diverticulosis of the colon. There is wall thickening of the ascending colon which may  be related to patient's liver failure and hypoproteinemia, although cannot exclude an acute colitis.  Pelvic images demonstrate a normal rectum, bladder and prostate. Stable L3 and L4 compression fractures. Degenerative changes of the spine and hips.  IMPRESSION: Continued evidence of cirrhosis with portal venous hypertension and moderate ascites. Ascites is slightly improved compared to the previous exam. 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  Mild wall thickening of the ascending colon which may be secondary to patient's cirrhosis with hypoproteinemia, although cannot exclude an acute colitis.  Chronic prominence of the proximal jejunal loops.  Stable the L3 and L4 compression fractures.   Electronically Signed   By: Elberta Fortis M.D.   On: 01/06/2014 20:05   [place] Art/ven Flow Abd Pelv Doppler  01/09/2014   CLINICAL DATA:  Cirrhosis, portal hypertension and refractory ascites. CT has demonstrated thrombus in the main portal vein extending into the left lobe of the liver.  EXAM: DUPLEX ULTRASOUND OF LIVER  TECHNIQUE: Color and duplex Doppler ultrasound was performed to evaluate the hepatic in-flow and out-flow vessels.  COMPARISON:  CT of the abdomen on 01/06/2014.  FINDINGS: Portal Vein  Velocities  Main:  Maximum measured velocity of 54 cm cm/sec.  Right:  24 cm/sec  Left:  14 cm/sec  Hepatic Vein Velocities  Right:  50 cm/sec  Middle:  34 cm/sec  Left:  71 cm/sec  Hepatic Artery Velocity: The hepatic artery could not be directly sampled.  Splenic Vein Velocity:  30 cm/sec  Varices: None visualized.  Ascites: Moderate ascites identified.  There is a significant amount of thrombus in the main portal vein which nearly occludes the lumen. Some flow is identified around the thrombus and into the liver. Nonocclusive thrombus is identified in the left portal vein. No significant thrombus is identified in the right portal vein. Main  portal vein sampling shows flow direction towards the liver.  The hepatic veins are normally patent. The spleen is significantly enlarged with estimated volume of 895 mL.  IMPRESSION: Significant amount of thrombus in the main portal vein that extends into the intrahepatic left portal vein. By ultrasound, the main portal vein lumen is mostly filled with thrombus with some flow was seen around the thrombus. There is no evidence of splenic vein thrombosis or hepatic veno-occlusive disease.   Electronically Signed   By: Irish Lack M.D.   On: 01/09/2014 16:41   US Paracentesis  01/09/2014   CLINICAL DATA:  Ascites  EXAM: ULTRASOUND GUIDED THERAPEUTIC PARACENTESIS  COMPARISON:  None.  PROCEDURE: An ultrasound guided paracentesis was thoroughly discussed with the patient and questions answered. The benefits, risks, alternatives and complications were also discussed. The patient understands and wishes to proceed with the procedure. Written consent was obtained.  Ultrasound was performed to localize and mark an adequate pocket of fluid in the right quadrant of the abdomen. The area was then prepped and draped in the normal sterile fashion. 1% Lidocaine was used for local anesthesia. Under ultrasound guidance a 19 gauge Yueh catheter was introduced. Paracentesis was performed. The catheter was removed and a dressing applied.  Complications: None.  FINDINGS: A total of approximately 2500 mL of yellow fluid was removed. A fluid sample was not sent for laboratory analysis.  IMPRESSION: Successful ultrasound guided paracentesis yielding 2500 mL of ascites.   Electronically Signed   By: Elige Ko   On: 01/09/2014 15:47    Assessment:  58 year old male with history of ETOH abuse, Hep C, cirrhosis, presenting with abdominal pain. MELD 15. Discriminant function 17-33 on admission in setting of mildly elevated transaminases. HCV RNA by PCR positive, genotype pending. Prednisolone on hold as benefits of steroid therapy  may not outweigh the risks.   Continue antibiotics for empiric treatment of possible SBP; CT showing ascites but ultrasound with small amount in RUQ. Prior records from [place] reviewed, with last paracentesis around 9/8 of 3 liters. Records stating negative fluid analysis for SBP. EGD in Dec 2014 with grade 2-3 esophageal varices s/p banding X 5. Appears did not have early interval EGD for reassessment; also appears was not maintained on non-selective beta-blocker for prophylaxis. Currently non-selective BB as been on hold due to ?of SBP. Increased mortality as been seen in the setting of ongoing use after episode of SBP. However, in setting of portal vein thrombosis and Xarelto, would be consider about risk of variceal bleeding. Difficult clinical scenario.  Chronic thrombus on CT within the junction of the splenic portal vein and throughout the main portal vein and left portal vein. Possible contributor to abdominal pain. Appreciate Hematology input and recommendations. Low-dose Xarelto recommended.   Question of colitis on CT; likely secondary to  hypoalbuminemia. Similar finding on CT in July 2015.    Plan: 1. Close interval follow-up with our office, 01/23/2014 at 11:30. 2. F/U Hepatitis A/B immune status to determine need for vaccination. 3. Hematology f/u for portal vein thrombosis.  4. Consider HCV treatment in upcoming future once we establish patient's ability for compliance. 5. Patient has been made aware to contact our office if he feels like he needs another LVAP prior to upcoming office visit. 6. etoh cessation. Consider Serax as per Dr. Darrick Penna previous recommendations. Encouraged AA meetings. 7. Complete 5 days of antibiotic therapy for empiric SBP treatment.  8. Will need f/u MRI liver at later time for liver lesion and EGD 3 months for variceal surveillance with coordination with hematology due to Xarelto.   LOS: 4 days   Tana Coast  01/10/2014, 11:49 AM

## 2014-01-10 NOTE — Discharge Summary (Signed)
Physician Discharge Summary  Darren Abbott ZOX:[id number] DOB: [birth year] DOA: 01/06/2014  PCP: No PCP Per Patient  Admit date: 01/06/2014 Discharge date: 01/10/2014  Time spent: 40 minutes  Recommendations for Outpatient Follow-up:  1. Follow up with hematology/oncology 02/05/14 for evaluation of thrombocytopenia.  2. Follow up with gastroenterology 01/23/14 for evaluations of ascites and need for paracentesis as well as follow up on hep c and cirrhosis therapies.  3. Home health RN for assessment of disease and compliance and PT for strength and endurance.  4. [facility] 01/27/14 for establishment of PCP care ie. 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 He had persistent fluid leakage out of his abdomen after having ascites drained at [facility] approximately 10 days prior. He has alcoholic related liver problems and appeared to have recurrent ascites. Presumably with cirrhosis and had decompensation. Unfortunately, he continues to drink alcohol. He was due to see a gastroenterologist but had not seen one yet. He came  in with abdominal pain. He had no fever, nausea or vomiting.   Hospital Course:  1. Abdominal pain/ Main protal vein thrombus.  No leukocytosis. CT revealed liver lesion, pulmonary nodules one new from 2011, ascites, spleenomegaly. MR abdomen with same. Liver doppler revealed significant amount of  thrombus in the main portal vein that extends into the intrahepatic left protal vein; the main protal vein lumen is mostly filled with thrombus with some flow seen around the thrombus; no evidence of splenic vein thrombus or hepatorenal occlusive disease. Evaluated by hematology who recommended low dose xarelto. Studies ordered by hematology yield negative anti-cardiolipin antibodies; negative ANA; negative lupus anticoagtulant; normal homocystine; low antithrombin III; low protein c/protein S.activity. Other studies pending include V Leiden and prothrombin gene mutation. Patient has appointment with hematology 02/05/14 for follow up. At discharge pain managed with oral pain meds.  2. Ongoing alcoholism. No evidence of alcohol withdrawal. SW consult. GI opine he is candidate for liver transplant but has to stop drinking 6 months. Verbalizes interest. Encourage AA meetings. Serax per GI 3. Hypertension. Somewhat soft after paracentesis but as ascites accumulates BP creeps up. His BB was held during hospitalization. Will resume at discharge at lower dose starting tomorrow. Of note, GI note indicates increased mortality in setting of ongoing use after episode SBP, however in setting of protal vein thrombosis and xarelto, would be consider risk of variceal bleeding.  4. History of epilepsy. No seizure activity. No meds noted.  5. Colitis: question on CT. Records from [facility] indicate similar finding 10/2013. No diarrhea 6. Cirrhosis with recurrent ascites. secondary to ETOH and hepatitis C. HCV viral load is 375,758. Treatment deferred to gastroenterology. S/p paracentesis 01/09/14 with 2.5L fluid removed. Provided with antibiotics for total 5 days empirically SBP. He has appointment with gastroenterology on 01/23/14 for evaluation of recurrent ascites, f/U hepatitis A/B immune status to determine need for vaccination, consider HCV  treatment in future..   7. Esophageal varices: per records EGD 12/14 with grade 2-3  esophageal varices s/p banding and erosive gastritis. Negative H.pylori.  8. Afib: hx of. SR on tele. Formerly on Eliquis. Unclear as to why not currently taking. See #7.  9. Pulmonary nodules: hx of same. CT chest reveal new nodule since 2011. Recommending follow up CT 1 year  10. Liver lesion:  Etiology unclear. Underlying focal liver lesion could not be confidnetly identified due to severe motion grade on MRI.  Per GI will need repeat MRI at later time and EGDMay need another limited MRI or liver bioipsy if applicable.      Procedures:  Liver doppler 01/09/14  Paracentesis 01/09/14    Consultations:  GI  Heme  Discharge Exam: Filed Vitals:   01/10/14 1436  BP: 107/63  Pulse: 96  Temp: 98.8 F (37.1 C)  Resp: 20    General: appears comfortable. Thin and chronically ill appearance Cardiovascular: S1 and S2 no gallup or rub. No LE Respiratory: normal effort BS clear bilaterally no wheeze no rhonchi Abdomen: soft +BS non-tender to palpation  Discharge Instructions You were cared for by a hospitalist during your hospital stay. If you have any questions about your discharge medications or the care you received while you were in the hospital after you are discharged, you can call the unit and asked to speak with the hospitalist on call if the hospitalist that took care of you is not available. Once you are discharged, your primary care physician will handle any further medical issues. Please note that NO REFILLS for any discharge medications will be authorized once you are discharged, as it is imperative that you return to your primary care physician (or establish a relationship with a primary care physician if you do not have one) for your aftercare needs so that they can reassess your need for medications and monitor your lab values.   Current Discharge Medication List    START taking these medications   Details  cefUROXime (CEFTIN) 500 MG tablet Take 1 tablet (500 mg total)  by mouth 2 (two) times daily with a meal. Qty: 4 tablet, Refills: 0    folic acid (FOLVITE) 1 MG tablet Take 1 tablet (1 mg total) by mouth daily. Qty: 30 tablet, Refills: 1    oxazepam (SERAX) 15 MG capsule Take 1 capsule (15 mg total) by mouth every 12 (twelve) hours as needed for anxiety. Qty: 30 capsule, Refills: 0    oxyCODONE (OXY IR/ROXICODONE) 5 MG immediate release tablet Take 1 tablet (5 mg total) by mouth every 4 (four) hours as needed for severe pain. Qty: 30 tablet, Refills: 0    pantoprazole (PROTONIX) 40 MG tablet Take 1 tablet (40 mg total) by mouth 2 (two) times daily before a meal. Qty: 30 tablet, Refills: 1    Rivaroxaban (XARELTO) 15 MG TABS tablet Take 1 tablet (15 mg total) by mouth daily with breakfast. Qty: 30 tablet, Refills: 1      CONTINUE these medications which have CHANGED   Details  Lactulose 20 GM/30ML SOLN Take 30 mLs (20 g total) by mouth 2 (two) times daily. Qty: 240 mL, Refills: 3   Associated Diagnoses: Alcoholic cirrhosis of liver with ascites; Transient alteration of awareness    metoprolol (LOPRESSOR) 50 MG tablet Take 0.5 tablets (25 mg total) by mouth daily. Qty: 60 tablet, Refills: 11   Associated Diagnoses: Back pain      CONTINUE these medications which have NOT CHANGED  Details  meloxicam (MOBIC) 15 MG tablet Take 1 tablet (15 mg total) by mouth daily. Qty: 30 tablet, Refills: 0   Associated Diagnoses: Muscle spasms of both lower extremities; Arthritis; Chronic pain syndrome    Multiple Vitamins-Minerals (CENTRUM SILVER PO) Take 1 tablet by mouth daily.    ondansetron (ZOFRAN) 4 MG tablet Take 4 mg by mouth every 8 (eight) hours as needed for nausea or vomiting.      STOP taking these medications     cephALEXin (KEFLEX) 500 MG capsule      ALPRAZolam (XANAX) 0.5 MG tablet        No Known Allergies Follow-up Information   Follow up with Maurilio Lovely, MD On 02/05/2014. (at 11:00 am)    Specialty:  Hematology and  Oncology   Contact information:   [address] [place] [address] [phone]       Follow up with Tana Coast, PA-C On 01/23/2014. (at 11:30am)    Specialty:  Gastroenterology   Contact information:   [address] [place] [address] [phone]       Follow up with [facility]. (1030 am on Oct 5th)    Contact information:    [address]  [place], [place] [address]      Follow up with [facility]. (they will call you)    Contact information:   [address] [place] [address] [phone]        The results of significant diagnostics from this hospitalization (including imaging, microbiology, ancillary and laboratory) are listed below for reference.    Significant Diagnostic Studies: Ct Chest W Contrast  01/08/2014   CLINICAL DATA:  Tobacco use. Blood clot, query occult malignancy. Long smoking history.  EXAM: CT CHEST WITH CONTRAST  TECHNIQUE: Multidetector CT imaging of the chest was performed during intravenous contrast administration.  CONTRAST:  80mL OMNIPAQUE IOHEXOL 300 MG/ML  SOLN  COMPARISON:  01/06/2014  FINDINGS: Please note that today's exam was not performed using the pulmonary embolus protocol. If this was in error, please alert the [facility] Radiology Department and we will have the patient return for repeat imaging using CT angiogram technique. Right hilar lymph node, 0.9 cm in short axis. Subcarinal lymph node, 1.3 cm in short axis. Small AP window lymph nodes are observed. Distal esophageal wall thickening is suspected with some low-density lower paraesophageal lymph nodes and potentially some small paraesophageal varices.  Hepatic cirrhosis is observed with diffuse mesenteric and omental edema with slight associated nodularity as reported on prior CT abdomen. Perihepatic ascites noted. Splenomegaly is partially shown. In the part of the liver that we included, there is a 1.1 x 0.8 cm  arterial phase enhancing nodule posteriorly in segment 6 of the liver, image 60 series 2.  Centrilobular emphysema. 5 x 3 mm right upper lobe nodule, image 24 series 3, no change from 03/01/2010. 3 mm triangular-shaped nodule, right upper lobe, image 29 series 3, not readily seen on the prior chest CT. 0.5 cm subpleural lymph node along the left major fissure, image 24 series 3, no change from 03/01/2010. Dependent subsegmental atelectasis or scarring is present in both lower lobes.  Left clavicular plate and screw fixator. Thoracic spondylosis without malalignment.  IMPRESSION: 1. Arterial phase enhancing 1.1 x 0.8 cm lesion in segment 6 of the liver in this patient with cirrhosis. I cannot exclude a small hepatic cellular carcinoma. Consider hepatic protocol MRI with and  without contrast for confirmation and assessment. 2. No lung cancer is observed. There is several small pulmonary nodules in the 5 mm range which are stable from 2011 and considered benign. There is a 3 mm triangular shaped nodule in the right upper lobe which I do not see on the prior 2011 exam in which it probably be followed. I would suggest a followup chest CT in 1 years time given that the patient does appear to have risk factors for malignancy. This recommendation follows the consensus statement: Guidelines for Management of Small Pulmonary Nodules Detected on CT Scans: A Statement from the Fleischner Society as published in Radiology 2005; 237:395-400. 3. Centrilobular emphysema. 4. Upper abdominal ascites and mesenteric and omental edema/infiltration. 5. Distal esophageal wall thickening -esophagitis is not excluded. 6. Splenomegaly. 7. Please note that today's exam was not performed using the pulmonary embolus protocol. If this was in error, please alert the [facility] Radiology Department and we will have the patient return for repeat imaging using CT angiogram technique.   Electronically Signed   By: Herbie Baltimore M.D.   On:  01/08/2014 09:34   Mr Abdomen W Wo Contrast  01/08/2014   CLINICAL DATA:  Evaluate liver lesion on CT. Cirrhosis with portal vein thrombosis.  EXAM: MRI ABDOMEN WITHOUT AND WITH CONTRAST  TECHNIQUE: Multiplanar multisequence MR imaging of the abdomen was performed both before and after the administration of intravenous contrast.  CONTRAST:  14mL MULTIHANCE GADOBENATE DIMEGLUMINE 529 MG/ML IV SOLN  COMPARISON:  CT chest dated 01/08/2014. CT abdomen pelvis dated 01/06/2014. CT abdomen pelvis dated 12/30/2013.  FINDINGS: Severely motion degraded images.  Cirrhotic configuration of the liver. Heterogeneous perfusion involving the posterior hepatic dome (series 5010/ image 26), nonspecific. Inferiorly in the posterior right hepatic lobe, in the region of the CT abnormality, an underlying focal lesion cannot be confidently identified due to severe motion degradation (series 5010/image 40). No definite corresponding restricted diffusion.  Splenomegaly, measuring 14.1 cm.  Pancreas and adrenal glands are grossly unremarkable.  Gallbladder is mildly thick-walled but underdistended (series 8/ image 33), without definite cholelithiasis. No intrahepatic or extrahepatic ductal dilatation.  Kidneys are within normal limits.  No hydronephrosis.  Moderate abdominal ascites.  Portal vein thrombosis (series 5011/ image 40), although better evaluated on CT. Gastroesophageal varices.  No focal osseous lesions.  IMPRESSION: Markedly limited evaluation with severely motion degraded images.  Heterogeneous perfusion involving the posterior hepatic dome. Enhancing lesion inferiorly in the posterior right hepatic lobe on CT cannot be confidently identified on MR. Further evaluation in the acute setting is of questionable clinical utility. Consider follow-up triple phase CT or repeat MR abdomen with/without contrast as an outpatient.  Portal vein thrombosis.  Splenomegaly.  Gastroesophageal varices.  Moderate abdominal ascites.    Electronically Signed   By: Charline Bills M.D.   On: 01/08/2014 18:09   US Abdomen Complete  01/09/2014   CLINICAL DATA:  Portal venous thrombosis.  EXAM: ULTRASOUND ABDOMEN COMPLETE  COMPARISON:  None.  FINDINGS: Gallbladder:  No gallstones are noted. Gallbladder wall thickness is noted measured at 7.5 mm most likely due to surrounding ascites or adjacent hepatocellular disease. No sonographic Murphy's sign is noted.  Common bile duct:  Diameter: 6.7 mm which is within normal limits.  Liver:  Nodular contours of of hepatic parenchyma are noted consistent with hepatic cirrhosis. No definite focal abnormality seen.  IVC:  No abnormality visualized.  Pancreas:  Not visualized due to overlying bowel gas.  Spleen:  Has a maximum measured  diameter 14.3 cm with calculated volume of 895 cubic cm consistent with moderate splenomegaly.  Right Kidney:  Length: 13.6 cm. Echogenicity within normal limits. No mass or hydronephrosis visualized.  Left Kidney:  Length: 13.7 cm. Echogenicity within normal limits. No mass or hydronephrosis visualized.  Abdominal aorta:  Not visualized due to overlying bowel gas.  Other findings:  Moderate ascites is noted.  IMPRESSION: Moderate ascites is noted. Hepatic cirrhosis with associated splenomegaly. Gallbladder wall thickening is noted without cholelithiasis; this most likely is due to surrounding ascites or adjacent hepatocellular disease.   Electronically Signed   By: Roque Lias M.D.   On: 01/09/2014 16:16   Ct Abdomen Pelvis W Contrast  01/06/2014   CLINICAL DATA:  Abdominal pain right upper quadrant. Paracentesis 1-1/2 weeks ago.  EXAM: CT ABDOMEN AND PELVIS WITH CONTRAST  TECHNIQUE: Multidetector CT imaging of the abdomen and pelvis was performed using the standard protocol following bolus administration of intravenous contrast.  CONTRAST:  OMNIPAQUE IOHEXOL 300 MG/ML  SOLN  COMPARISON:  12/30/2013, 11/18/2013 and 09/14/2008  FINDINGS: Lung bases are unremarkable.   Abdominal images demonstrate a small liver with nodular contour or unchanged. There is mild splenomegaly. Findings are compatible with cirrhosis. There is no change in chronic thrombus over the junction of the splenic vein to portal vein and throughout the main portal vein and left portal vein. There is moderate ascites with slight interval improvement. Gallbladder is contracted. The pancreas and adrenal glands are within normal. There are a few small nonspecific lymph nodes over the upper abdomen and periaortic region without significant change. Kidneys normal in size without hydronephrosis or nephrolithiasis. There is a sub cm hypodensity over the upper pole of the right kidney unchanged and likely a cyst, but too small to characterize. There is calcified plaque over the abdominal aorta and iliac arteries. Appendix is within normal.  There is mild prominence of several proximal jejunal loops without significant change. There is diverticulosis of the colon. There is wall thickening of the ascending colon which may be related to patient's liver failure and hypoproteinemia, although cannot exclude an acute colitis.  Pelvic images demonstrate a normal rectum, bladder and prostate. Stable L3 and L4 compression fractures. Degenerative changes of the spine and hips.  IMPRESSION: Continued evidence of cirrhosis with portal venous hypertension and moderate ascites. Ascites is slightly improved compared to the previous exam. 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  Mild wall thickening of the ascending colon which may be secondary to patient's cirrhosis with hypoproteinemia, although cannot exclude an acute colitis.  Chronic prominence of the proximal jejunal loops.  Stable the L3 and L4 compression fractures.   Electronically Signed   By: Elberta Fortis M.D.   On: 01/06/2014 20:05   [place] Art/ven Flow Abd Pelv Doppler  01/09/2014   CLINICAL  DATA:  Cirrhosis, portal hypertension and refractory ascites. CT has demonstrated thrombus in the main portal vein extending into the left lobe of the liver.  EXAM: DUPLEX ULTRASOUND OF LIVER  TECHNIQUE: Color and duplex Doppler ultrasound was performed to evaluate the hepatic in-flow and out-flow vessels.  COMPARISON:  CT of the abdomen on 01/06/2014.  FINDINGS: Portal Vein Velocities  Main:  Maximum measured velocity of 54 cm cm/sec.  Right:  24 cm/sec  Left:  14 cm/sec  Hepatic Vein Velocities  Right:  50 cm/sec  Middle:  34 cm/sec  Left:  71 cm/sec  Hepatic  Artery Velocity: The hepatic artery could not be directly sampled.  Splenic Vein Velocity:  30 cm/sec  Varices: None visualized.  Ascites: Moderate ascites identified.  There is a significant amount of thrombus in the main portal vein which nearly occludes the lumen. Some flow is identified around the thrombus and into the liver. Nonocclusive thrombus is identified in the left portal vein. No significant thrombus is identified in the right portal vein. Main portal vein sampling shows flow direction towards the liver.  The hepatic veins are normally patent. The spleen is significantly enlarged with estimated volume of 895 mL.  IMPRESSION: Significant amount of thrombus in the main portal vein that extends into the intrahepatic left portal vein. By ultrasound, the main portal vein lumen is mostly filled with thrombus with some flow was seen around the thrombus. There is no evidence of splenic vein thrombosis or hepatic veno-occlusive disease.   Electronically Signed   By: Irish Lack M.D.   On: 01/09/2014 16:41   US Paracentesis  01/09/2014   CLINICAL DATA:  Ascites  EXAM: ULTRASOUND GUIDED THERAPEUTIC PARACENTESIS  COMPARISON:  None.  PROCEDURE: An ultrasound guided paracentesis was thoroughly discussed with the patient and questions answered. The benefits, risks, alternatives and complications were also discussed. The patient understands and wishes to  proceed with the procedure. Written consent was obtained.  Ultrasound was performed to localize and mark an adequate pocket of fluid in the right quadrant of the abdomen. The area was then prepped and draped in the normal sterile fashion. 1% Lidocaine was used for local anesthesia. Under ultrasound guidance a 19 gauge Yueh catheter was introduced. Paracentesis was performed. The catheter was removed and a dressing applied.  Complications: None.  FINDINGS: A total of approximately 2500 mL of yellow fluid was removed. A fluid sample was not sent for laboratory analysis.  IMPRESSION: Successful ultrasound guided paracentesis yielding 2500 mL of ascites.   Electronically Signed   By: Elige Ko   On: 01/09/2014 15:47   Dg Chest Port 1 View  01/06/2014   CLINICAL DATA:  Shortness of breath, smoker, hypertension, abdominal pain  EXAM: PORTABLE CHEST - 1 VIEW  COMPARISON:  12/17/2013  FINDINGS: Left clavicular hardware. Bilateral AC joint degenerative change noted. Heart size is normal. Curvilinear left lower lobe atelectasis or scarring. Lungs are hypoaerated with crowding of the bronchovascular markings. No focal pulmonary opacity otherwise. No pleural effusion.  IMPRESSION: Low lung volumes bilaterally with crowding of the bronchovascular markings and left lower lobe atelectasis.   Electronically Signed   By: Christiana Pellant M.D.   On: 01/06/2014 15:39   US Abdomen Limited Ruq  01/06/2014   CLINICAL DATA:  Ascites.  Cirrhosis and hepatitis.  EXAM: LIMITED ABDOMEN ULTRASOUND FOR ASCITES  TECHNIQUE: Limited ultrasound survey for ascites was performed in all four abdominal quadrants.  COMPARISON:  Multiple exams, including 12/31/2013 and 12/30/2013  FINDINGS: Small amount of ascites noted in the right upper quadrant, but not enough to provide a therapeutic benefit if drained. Accordingly, paracentesis was not performed.  IMPRESSION: 1. Small amount of ascites in the right upper quadrant, not judged to provide a  significant therapeutic benefit if drained.   Electronically Signed   By: Herbie Baltimore M.D.   On: 01/06/2014 16:42    Microbiology: No results found for this or any previous visit (from the past 240 hour(s)).   Labs: Basic Metabolic Panel:  Recent Labs Lab 01/06/14 1518 01/07/14 0539 01/08/14 0917 01/09/14 0520 01/10/14 0708  NA 143  140 140 138 137  K 4.4 4.1 3.7 3.9 4.2  CL 106 108 109 106 104  CO2 GLUCOSE 81 102* 99 94 83  BUN 4* 5* 5* 6 6  CREATININE 0.62 0.69 0.57 0.55 0.59  CALCIUM 8.5 7.6* 7.3* 7.9* 7.5*   Liver Function Tests:  Recent Labs Lab 01/06/14 1518 01/07/14 0539 01/08/14 0917 01/10/14 0708  AST 71* 49* 46* 44*  ALT 32 ALKPHOS 164* 134* 118* 109  BILITOT 2.7* 2.4* 2.9* 2.7*  PROT 7.0 5.5* 5.4* 5.4*  ALBUMIN 2.7* 2.1* 2.0* 2.0*    Recent Labs Lab 01/06/14 1518 01/08/14 0832  LIPASE 71* 51   No results found for this basename: AMMONIA,  in the last 168 hours CBC:  Recent Labs Lab 01/06/14 1518 01/07/14 0539 01/08/14 0832 01/09/14 0520 01/10/14 0708  WBC 4.3 5.1 4.4 5.3 4.0  NEUTROABS 2.5  --   --   --   --   HGB 14.6 13.5 13.7 14.3 12.4*  HCT 41.2 38.8* 39.0 41.1 35.4*  MCV 89.8 90.4 91.1 90.3 91.0  PLT 72* 58* 55* 65* 54*   Cardiac Enzymes: No results found for this basename: CKTOTAL, CKMB, CKMBINDEX, TROPONINI,  in the last 168 hours BNP: BNP (last 3 results) No results found for this basename: PROBNP,  in the last 8760 hours CBG: No results found for this basename: GLUCAP,  in the last 168 hours     Signed:  Gwenyth Bender  [facility] 01/10/2014, 4:33 PM

## 2014-01-10 NOTE — Discharge Summary (Signed)
The patient was seen and examined. He was discussed with nurse practitioner, Ms. Vedia Coffer. Agree with her assessment and plan.  Discharge diagnoses:  1. Generalized abdominal pain, probably from portal vein thrombus and possibly ascites. 2. 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 Low dose Xarelto started. ---Hypercoagulable studies revealed negative anti-cardiolipin antibodies; negative ANA; negative lupus anticoagulant; normal homocystine; low antithrombin III; low protein C/protein S. activity. Other studies were pending include factor V Leiden and prothrombin gene mutation. Will await hematology followup recommendations. 3. 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 Hepatitis C viral load was [phone]. Deferred definitive treatment of hepatitis C to gastroenterology. 4. Recurrent ascites. Status post paracentesis yielding 2.5 L during this hospitalization. (10/2013, he had 3 L removed at [facility]). 5. Possible subclinical SBP. Treated with short course of antibiotics. 6. Liver lesion. MRI of the abdomen was ordered for further evaluation, but the liver lesion seen on the CT scan of the abdomen and pelvis could not be confidently identified due  to severe motion degradation. He'll need another limited MRI or if applicable, a liver biopsy electively. We'll defer this to gastroenterology. 7. Pulmonary nodules, mostly chronic, with a new nodule seen per CT of the chest. Recommend followup CT or an eventual PET scan in the near future. 8. Alcohol abuse. The patient was strongly advised to stop smoking completely to be considered for liver transplant. 9. History of esophageal varices with grade 2-3 esophageal varices status post banding and erosive gastritis per EGD in December 2014. 10. Splenomegaly associated cirrhosis. 11. Hypertension. Blood pressures low normal during the hospitalization, so metoprolol decreased from 50 mg twice a day to 25 mg  daily.

## 2014-01-10 NOTE — Telephone Encounter (Signed)
Patient being discharged from [facility]. Hospital follow up scheduled for 01/23/14 at 11:30. Needs to arrive at 11:15.

## 2014-01-13 ENCOUNTER — Ambulatory Visit: Payer: Medicaid Other | Admitting: Family Medicine

## 2014-01-13 LAB — PROTHROMBIN GENE MUTATION

## 2014-01-13 LAB — FACTOR 5 LEIDEN

## 2014-01-13 NOTE — Telephone Encounter (Signed)
Letter mailed

## 2014-01-20 ENCOUNTER — Ambulatory Visit (INDEPENDENT_AMBULATORY_CARE_PROVIDER_SITE_OTHER): Payer: Medicaid Other | Admitting: Family Medicine

## 2014-01-20 VITALS — BP 125/75 | HR 66 | Temp 97.5°F | Ht 73.0 in | Wt 159.8 lb

## 2014-01-20 DIAGNOSIS — M1990 Unspecified osteoarthritis, unspecified site: Secondary | ICD-10-CM

## 2014-01-20 DIAGNOSIS — H269 Unspecified cataract: Secondary | ICD-10-CM

## 2014-01-20 DIAGNOSIS — M129 Arthropathy, unspecified: Secondary | ICD-10-CM

## 2014-01-20 DIAGNOSIS — M62838 Other muscle spasm: Secondary | ICD-10-CM

## 2014-01-20 DIAGNOSIS — G894 Chronic pain syndrome: Secondary | ICD-10-CM

## 2014-01-20 MED ORDER — MELOXICAM 15 MG PO TABS: 15.0000 mg | ORAL_TABLET | Freq: Every day | ORAL | Status: DC

## 2014-01-20 NOTE — Progress Notes (Signed)
   Subjective:    Patient ID: Darren Abbott, male    DOB: 06-02-1955, 58 y.o.   MRN: [id number]  HPI Patient is here for follow up.  He has hx of ETOH abuse and cirrhosis.  He was recently hospitalized at  [facility] for AMS and leakage from his parcentesis sight from prior hospitalization at [facility] and prior parencentesis procedure.  He was seen in Dec 2014 for bleeding esophageal varicies in [place]. He has some hx of noncompliance.  He has resumed drinking.  He has been encouraged to stop drinking.  He has a thrombus of his portal vein and now is on xarelto. He is seeing Dr. Gerilyn Nestle in [facility].  He has an appointment with a Med/Peds doctor 27 January 2014 for establishment for PCP.  He is requesting xanax.  He has been rx'd ativan at last hospitalization.  He states he was told by his optometrist he needs to see Opthamology for bilateral cataracts.   Review of Systems No chest pain, SOB, HA, dizziness, vision change, N/V, diarrhea, constipation, dysuria, urinary urgency or frequency, myalgias, arthralgias or rash.     Objective:   Physical Exam Vital signs noted  Well developed well nourished male.  HEENT - Head atraumatic Normocephalic                Eyes - PERRLA, Conjuctiva - clear Sclera- Clear EOMI                Ears - EAC's Wnl TM's Wnl Gross Hearing WNL                Throat - oropharanx wnl Respiratory - Lungs CTA bilateral Cardiac - RRR S1 and S2 without murmur GI - Abdomen soft distended Extremities - No edema. Neuro - Grossly intact.       Assessment & Plan:  Muscle spasms of both lower extremities - Plan: meloxicam (MOBIC) 15 MG tablet  Arthritis - Plan: meloxicam (MOBIC) 15 MG tablet  Chronic pain syndrome - Plan: meloxicam (MOBIC) 15 MG tablet  Bilateral cataracts - Plan: Ambulatory referral to Ophthalmology  Deatra Canter FNP

## 2014-01-23 ENCOUNTER — Encounter: Payer: Self-pay | Admitting: Gastroenterology

## 2014-01-23 ENCOUNTER — Ambulatory Visit (INDEPENDENT_AMBULATORY_CARE_PROVIDER_SITE_OTHER): Payer: Medicaid Other | Admitting: Gastroenterology

## 2014-01-23 ENCOUNTER — Telehealth: Payer: Self-pay

## 2014-01-23 ENCOUNTER — Other Ambulatory Visit: Payer: Self-pay

## 2014-01-23 VITALS — BP 122/76 | HR 72 | Temp 98.1°F | Ht 73.0 in | Wt 162.4 lb

## 2014-01-23 DIAGNOSIS — R188 Other ascites: Secondary | ICD-10-CM

## 2014-01-23 DIAGNOSIS — R601 Generalized edema: Secondary | ICD-10-CM | POA: Insufficient documentation

## 2014-01-23 DIAGNOSIS — K7460 Unspecified cirrhosis of liver: Secondary | ICD-10-CM

## 2014-01-23 DIAGNOSIS — B182 Chronic viral hepatitis C: Secondary | ICD-10-CM

## 2014-01-23 LAB — PLATELET AB, INDIRECT, IGG

## 2014-01-23 MED ORDER — SPIRONOLACTONE 50 MG PO TABS: 50.0000 mg | ORAL_TABLET | Freq: Every day | ORAL | Status: AC

## 2014-01-23 MED ORDER — FUROSEMIDE 20 MG PO TABS: 20.0000 mg | ORAL_TABLET | Freq: Every day | ORAL | Status: AC

## 2014-01-23 NOTE — Telephone Encounter (Signed)
[name] or Raynelle FanningJulie, Please call hematology tomorrow and find out about the Xarelto. Patient is scheduled for LVAP on Tuesday. Needs to be addressed Friday. I will be out of town.

## 2014-01-23 NOTE — Telephone Encounter (Signed)
X-ray is wanting to know if he can hold his Xarelto on Sunday and Monday? Please advise

## 2014-01-23 NOTE — Telephone Encounter (Signed)
Defer to hematology.   Patient with portal vein thrombosis on Xarelto, being managed by hematology.   Can we hold Xarelto 48 hours for abdominal paracentesis?

## 2014-01-23 NOTE — Telephone Encounter (Signed)
Tobi Bastosnna, can you please follow up on this tomorrow?

## 2014-01-23 NOTE — Progress Notes (Signed)
Primary Care Physician: Rudi Heap, MD  Primary Gastroenterologist:  Jonette Eva, MD   Chief Complaint  Patient presents with  . Follow-up    HPI: Darren Abbott is a 58 y.o. male here for f/u of recent hospitalization.   H/O ETOH abuse, HCV, cirrhosis, presented to hospital earlier this month with abdominal pain. MELD 15. DF 17-33, prednisolone on hold due to chronic HCV, genotype 1a. Treated empirically for SBP although no fluid analysis was available locally. Apparently negative at [facility]. EGD 03/2013, Dr. Samuella Cota, grade 2-3 esophageal varices s/p banding X 5. Was not given non-selective BB, has been on hold due to ?of SBP. Increased mortality has been seen in setting of ongoing use after episode of SBP. Also in setting of his portal vein thrombosis and Xarelto, would be considered above risk of variceal bleeding.   Patient needs Hep A and B vaccinations.  Wt Readings from Last 3 Encounters:  01/23/14 162 lb 6.4 oz (73.664 kg)  01/20/14 159 lb 12.8 oz (72.485 kg)  01/06/14 158 lb (71.668 kg)     BM 2-3 stools per day on lactulose. No melena, brbpr. Abdomen sore and tight, especially on right side. No N/V. Feels like cannot eat. No etoh since out of the hospital, "Doing good". Has appointment Monday with [facility] to establish care.   Current Outpatient Prescriptions  Medication Sig Dispense Refill  . folic acid (FOLVITE) 1 MG tablet Take 1 tablet (1 mg total) by mouth daily.  30 tablet  1  . Lactulose 20 GM/30ML SOLN Take 30 mLs (20 g total) by mouth 2 (two) times daily.  240 mL  3  . metoprolol (LOPRESSOR) 50 MG tablet Take 0.5 tablets (25 mg total) by mouth daily.  60 tablet  11  . Multiple Vitamins-Minerals (CENTRUM SILVER PO) Take 1 tablet by mouth daily.      . ondansetron (ZOFRAN) 4 MG tablet Take 4 mg by mouth every 8 (eight) hours as needed for nausea or vomiting.      . pantoprazole (PROTONIX) 40 MG tablet Take 1 tablet (40 mg total) by mouth 2 (two)  times daily before a meal.  30 tablet  1  . Rivaroxaban (XARELTO) 15 MG TABS tablet Take 1 tablet (15 mg total) by mouth daily with breakfast.  30 tablet  1   No current facility-administered medications for this visit.    Allergies as of 01/23/2014  . (No Known Allergies)   Past Medical History  Diagnosis Date  . Malnutrition of moderate degree   . Altered mental status   . Unspecified essential hypertension   . Anxiety state, unspecified   . Transient ischemic attack (TIA), and cerebral infarction without residual deficits(V12.54)   . Body mass index between 19-24, adult   . Atrial fibrillation     episode at [place].  [facility] Heart murmur   . Obstructive sleep apnea (adult) (pediatric)     to start back after surgery- not used for 2-19months  . GERD (gastroesophageal reflux disease)   . Unspecified epilepsy without mention of intractable epilepsy 13    only once  . Hand laceration 80    end of all fingers on rt hand  . Old myocardial infarction   . Asthma     "outgrew it"  . Borderline diabetes   . 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 Acute alcoholic hepatitis     possible hepatic encephalopathy  May 10, 2011  . Daily headache   . Migraine     "daily" (08/30/2013)  . Stroke ~ 2000    "mild"  . Arthritis     "I'm ate up w/it" (08/30/2013)  . Chronic lower back pain   . On home oxygen therapy     "2L prn" (08/30/2013)  . Chronic kidney disease     "one isn't functioning like it should" (08/30/2013)  . Cirrhosis   . Esophageal varices   . Thrombus     chronic,junction of splenic portal vein  . Pulmonary nodules     chronic an new 2015   Past Surgical History  Procedure Laterality Date  . Clavicle surgery Left ~ 2008    "crushed it"  . Hand surgery Left     lacerations of thumb. reattached  . Nasal septum surgery  08/30/2013  . Rhinoplasty  08/30/2013  . Hand surgery Right 1980's    "all 4 fingers cut off and reattached"  . Foot  fracture surgery Right   . Septoplasty N/A 08/30/2013    Procedure: SEPTOPLASTY;  Surgeon: Serena Colonel, MD;  Location: [facility] OR;  Service: ENT;  Laterality: N/A;  . Rhinoplasty N/A 08/30/2013    Procedure: RHINOPLASTY;  Surgeon: Serena Colonel, MD;  Location: [facility] OR;  Service: ENT;  Laterality: N/A;  . Esophagogastroduodenoscopy  Dec 2014    Dr. Samuella Cota: Grade 2-3 esophageal varices s/p banding X 5. Erosive gastritis. Negative H.pylori  . Paracentesis      multiple    ROS:  General: Negative for anorexia, weight loss, fever, chills, fatigue, weakness. ENT: Negative for hoarseness, difficulty swallowing , nasal congestion. CV: Negative for chest pain, angina, palpitations, dyspnea on exertion. + peripheral edema.  Respiratory: Negative for dyspnea at rest, dyspnea on exertion, cough, sputum, wheezing.  GI: See history of present illness. GU:  Negative for dysuria, hematuria, urinary incontinence, urinary frequency, nocturnal urination.  Endo: Negative for unusual weight change.    Physical Examination:   BP 122/76  Pulse 72  Temp(Src) 98.1 F (36.7 C) (Oral)  Ht 6\' 1"  (1.854 m)  Wt 162 lb 6.4 oz (73.664 kg)  BMI 21.43 kg/m2  General: chronically ill-appearing WM, in no acute distress.  Eyes: No icterus. Mouth: Oropharyngeal mucosa moist and pink , no lesions erythema or exudate. Lungs: Clear to auscultation bilaterally.  Heart: Regular rate and rhythm, no murmurs rubs or gallops.  Abdomen: Bowel sounds are normal, mild ruq tenderness, moderately distended abd, +fluid wave, no hepatosplenomegaly or masses, no abdominal bruits or hernia , no rebound or guarding.   Extremities: 2+ lower extremity edema. No clubbing or deformities. Neuro: Alert and oriented x 4   Skin: Warm and dry, no jaundice.   Psych: Alert and cooperative, normal mood and affect.  Labs:  Lab Results  Component Value Date   WBC 4.0 01/10/2014   HGB 12.4* 01/10/2014   HCT 35.4* 01/10/2014   MCV 91.0 01/10/2014   PLT 54*  01/10/2014   Lab Results  Component Value Date   ALT 21 01/10/2014   AST 44* 01/10/2014   ALKPHOS 109 01/10/2014   BILITOT 2.7* 01/10/2014   Lab Results  Component Value Date   CREATININE 0.59 01/10/2014   BUN 6 01/10/2014   NA 137 01/10/2014   K 4.2 01/10/2014   CL 104 01/10/2014   CO2 27 01/10/2014   Lab Results  Component Value Date   INR 1.55* 01/08/2014   INR 1.53* 01/07/2014   INR 1.39 01/06/2014  Imaging Studies: Ct Chest W Contrast  01/08/2014   CLINICAL DATA:  Tobacco use. Blood clot, query occult malignancy. Long smoking history.  EXAM: CT CHEST WITH CONTRAST  TECHNIQUE: Multidetector CT imaging of the chest was performed during intravenous contrast administration.  CONTRAST:  80mL OMNIPAQUE IOHEXOL 300 MG/ML  SOLN  COMPARISON:  01/06/2014  FINDINGS: Please note that today's exam was not performed using the pulmonary embolus protocol. If this was in error, please alert the [facility] Radiology Department and we will have the patient return for repeat imaging using CT angiogram technique. Right hilar lymph node, 0.9 cm in short axis. Subcarinal lymph node, 1.3 cm in short axis. Small AP window lymph nodes are observed. Distal esophageal wall thickening is suspected with some low-density lower paraesophageal lymph nodes and potentially some small paraesophageal varices.  Hepatic cirrhosis is observed with diffuse mesenteric and omental edema with slight associated nodularity as reported on prior CT abdomen. Perihepatic ascites noted. Splenomegaly is partially shown. In the part of the liver that we included, there is a 1.1 x 0.8 cm arterial phase enhancing nodule posteriorly in segment 6 of the liver, image 60 series 2.  Centrilobular emphysema. 5 x 3 mm right upper lobe nodule, image 24 series 3, no change from 03/01/2010. 3 mm triangular-shaped nodule, right upper lobe, image 29 series 3, not readily seen on the prior chest CT. 0.5 cm subpleural lymph node along the left major fissure, image  24 series 3, no change from 03/01/2010. Dependent subsegmental atelectasis or scarring is present in both lower lobes.  Left clavicular plate and screw fixator. Thoracic spondylosis without malalignment.  IMPRESSION: 1. Arterial phase enhancing 1.1 x 0.8 cm lesion in segment 6 of the liver in this patient with cirrhosis. I cannot exclude a small hepatic cellular carcinoma. Consider hepatic protocol MRI with and without contrast for confirmation and assessment. 2. No lung cancer is observed. There is several small pulmonary nodules in the 5 mm range which are stable from 2011 and considered benign. There is a 3 mm triangular shaped nodule in the right upper lobe which I do not see on the prior 2011 exam in which it probably be followed. I would suggest a followup chest CT in 1 years time given that the patient does appear to have risk factors for malignancy. This recommendation follows the consensus statement: Guidelines for Management of Small Pulmonary Nodules Detected on CT Scans: A Statement from the Fleischner Society as published in Radiology 2005; 237:395-400. 3. Centrilobular emphysema. 4. Upper abdominal ascites and mesenteric and omental edema/infiltration. 5. Distal esophageal wall thickening -esophagitis is not excluded. 6. Splenomegaly. 7. Please note that today's exam was not performed using the pulmonary embolus protocol. If this was in error, please alert the [facility] Radiology Department and we will have the patient return for repeat imaging using CT angiogram technique.   Electronically Signed   By: Herbie Baltimore M.D.   On: 01/08/2014 09:34   Mr Abdomen W Wo Contrast  01/08/2014   CLINICAL DATA:  Evaluate liver lesion on CT. Cirrhosis with portal vein thrombosis.  EXAM: MRI ABDOMEN WITHOUT AND WITH CONTRAST  TECHNIQUE: Multiplanar multisequence MR imaging of the abdomen was performed both before and after the administration of intravenous contrast.  CONTRAST:  14mL MULTIHANCE GADOBENATE  DIMEGLUMINE 529 MG/ML IV SOLN  COMPARISON:  CT chest dated 01/08/2014. CT abdomen pelvis dated 01/06/2014. CT abdomen pelvis dated 12/30/2013.  FINDINGS: Severely motion degraded images.  Cirrhotic configuration of the liver. Heterogeneous  perfusion involving the posterior hepatic dome (series 5010/ image 26), nonspecific. Inferiorly in the posterior right hepatic lobe, in the region of the CT abnormality, an underlying focal lesion cannot be confidently identified due to severe motion degradation (series 5010/image 40). No definite corresponding restricted diffusion.  Splenomegaly, measuring 14.1 cm.  Pancreas and adrenal glands are grossly unremarkable.  Gallbladder is mildly thick-walled but underdistended (series 8/ image 33), without definite cholelithiasis. No intrahepatic or extrahepatic ductal dilatation.  Kidneys are within normal limits.  No hydronephrosis.  Moderate abdominal ascites.  Portal vein thrombosis (series 5011/ image 40), although better evaluated on CT. Gastroesophageal varices.  No focal osseous lesions.  IMPRESSION: Markedly limited evaluation with severely motion degraded images.  Heterogeneous perfusion involving the posterior hepatic dome. Enhancing lesion inferiorly in the posterior right hepatic lobe on CT cannot be confidently identified on MR. Further evaluation in the acute setting is of questionable clinical utility. Consider follow-up triple phase CT or repeat MR abdomen with/without contrast as an outpatient.  Portal vein thrombosis.  Splenomegaly.  Gastroesophageal varices.  Moderate abdominal ascites.   Electronically Signed   By: Charline Bills M.D.   On: 01/08/2014 18:09   US Abdomen Complete  01/09/2014   CLINICAL DATA:  Portal venous thrombosis.  EXAM: ULTRASOUND ABDOMEN COMPLETE  COMPARISON:  None.  FINDINGS: Gallbladder:  No gallstones are noted. Gallbladder wall thickness is noted measured at 7.5 mm most likely due to surrounding ascites or adjacent hepatocellular  disease. No sonographic Murphy's sign is noted.  Common bile duct:  Diameter: 6.7 mm which is within normal limits.  Liver:  Nodular contours of of hepatic parenchyma are noted consistent with hepatic cirrhosis. No definite focal abnormality seen.  IVC:  No abnormality visualized.  Pancreas:  Not visualized due to overlying bowel gas.  Spleen:  Has a maximum measured diameter 14.3 cm with calculated volume of 895 cubic cm consistent with moderate splenomegaly.  Right Kidney:  Length: 13.6 cm. Echogenicity within normal limits. No mass or hydronephrosis visualized.  Left Kidney:  Length: 13.7 cm. Echogenicity within normal limits. No mass or hydronephrosis visualized.  Abdominal aorta:  Not visualized due to overlying bowel gas.  Other findings:  Moderate ascites is noted.  IMPRESSION: Moderate ascites is noted. Hepatic cirrhosis with associated splenomegaly. Gallbladder wall thickening is noted without cholelithiasis; this most likely is due to surrounding ascites or adjacent hepatocellular disease.   Electronically Signed   By: Roque Lias M.D.   On: 01/09/2014 16:16   Ct Abdomen Pelvis W Contrast  01/06/2014   CLINICAL DATA:  Abdominal pain right upper quadrant. Paracentesis 1-1/2 weeks ago.  EXAM: CT ABDOMEN AND PELVIS WITH CONTRAST  TECHNIQUE: Multidetector CT imaging of the abdomen and pelvis was performed using the standard protocol following bolus administration of intravenous contrast.  CONTRAST:  OMNIPAQUE IOHEXOL 300 MG/ML  SOLN  COMPARISON:  12/30/2013, 11/18/2013 and 09/14/2008  FINDINGS: Lung bases are unremarkable.  Abdominal images demonstrate a small liver with nodular contour or unchanged. There is mild splenomegaly. Findings are compatible with cirrhosis. There is no change in chronic thrombus over the junction of the splenic vein to portal vein and throughout the main portal vein and left portal vein. There is moderate ascites with slight interval improvement. Gallbladder is contracted.  The pancreas and adrenal glands are within normal. There are a few small nonspecific lymph nodes over the upper abdomen and periaortic region without significant change. Kidneys normal in size without hydronephrosis or nephrolithiasis. There is a sub  cm hypodensity over the upper pole of the right kidney unchanged and likely a cyst, but too small to characterize. There is calcified plaque over the abdominal aorta and iliac arteries. Appendix is within normal.  There is mild prominence of several proximal jejunal loops without significant change. There is diverticulosis of the colon. There is wall thickening of the ascending colon which may be related to patient's liver failure and hypoproteinemia, although cannot exclude an acute colitis.  Pelvic images demonstrate a normal rectum, bladder and prostate. Stable L3 and L4 compression fractures. Degenerative changes of the spine and hips.  IMPRESSION: Continued evidence of cirrhosis with portal venous hypertension and moderate ascites. Ascites is slightly improved compared to the previous exam. 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  Mild wall thickening of the ascending colon which may be secondary to patient's cirrhosis with hypoproteinemia, although cannot exclude an acute colitis.  Chronic prominence of the proximal jejunal loops.  Stable the L3 and L4 compression fractures.   Electronically Signed   By: Elberta Fortisaniel  Boyle M.D.   On: 01/06/2014 20:05   [place] Art/ven Flow Abd Pelv Doppler  01/09/2014   CLINICAL DATA:  Cirrhosis, portal hypertension and refractory ascites. CT has demonstrated thrombus in the main portal vein extending into the left lobe of the liver.  EXAM: DUPLEX ULTRASOUND OF LIVER  TECHNIQUE: Color and duplex Doppler ultrasound was performed to evaluate the hepatic in-flow and out-flow vessels.  COMPARISON:  CT of the abdomen on 01/06/2014.  FINDINGS: Portal Vein  Velocities  Main:  Maximum measured velocity of 54 cm cm/sec.  Right:  24 cm/sec  Left:  14 cm/sec  Hepatic Vein Velocities  Right:  50 cm/sec  Middle:  34 cm/sec  Left:  71 cm/sec  Hepatic Artery Velocity: The hepatic artery could not be directly sampled.  Splenic Vein Velocity:  30 cm/sec  Varices: None visualized.  Ascites: Moderate ascites identified.  There is a significant amount of thrombus in the main portal vein which nearly occludes the lumen. Some flow is identified around the thrombus and into the liver. Nonocclusive thrombus is identified in the left portal vein. No significant thrombus is identified in the right portal vein. Main portal vein sampling shows flow direction towards the liver.  The hepatic veins are normally patent. The spleen is significantly enlarged with estimated volume of 895 mL.  IMPRESSION: Significant amount of thrombus in the main portal vein that extends into the intrahepatic left portal vein. By ultrasound, the main portal vein lumen is mostly filled with thrombus with some flow was seen around the thrombus. There is no evidence of splenic vein thrombosis or hepatic veno-occlusive disease.   Electronically Signed   By: Irish LackGlenn  Yamagata M.D.   On: 01/09/2014 16:41   [place] Paracentesis  01/09/2014   CLINICAL DATA:  Ascites  EXAM: ULTRASOUND GUIDED THERAPEUTIC PARACENTESIS  COMPARISON:  None.  PROCEDURE: An ultrasound guided paracentesis was thoroughly discussed with the patient and questions answered. The benefits, risks, alternatives and complications were also discussed. The patient understands and wishes to proceed with the procedure. Written consent was obtained.  Ultrasound was performed to localize and mark an adequate pocket of fluid in the right quadrant of the abdomen. The area was then prepped and draped in the normal sterile fashion. 1% Lidocaine was used for local anesthesia. Under ultrasound guidance a 19 gauge Yueh catheter was introduced. Paracentesis was performed.  The catheter was  removed and a dressing applied.  Complications: None.  FINDINGS: A total of approximately 2500 mL of yellow fluid was removed. A fluid sample was not sent for laboratory analysis.  IMPRESSION: Successful ultrasound guided paracentesis yielding 2500 mL of ascites.   Electronically Signed   By: Elige Ko   On: 01/09/2014 15:47    US Abdomen Limited Ruq  01/06/2014   CLINICAL DATA:  Ascites.  Cirrhosis and hepatitis.  EXAM: LIMITED ABDOMEN ULTRASOUND FOR ASCITES  TECHNIQUE: Limited ultrasound survey for ascites was performed in all four abdominal quadrants.  COMPARISON:  Multiple exams, including 12/31/2013 and 12/30/2013  FINDINGS: Small amount of ascites noted in the right upper quadrant, but not enough to provide a therapeutic benefit if drained. Accordingly, paracentesis was not performed.  IMPRESSION: 1. Small amount of ascites in the right upper quadrant, not judged to provide a significant therapeutic benefit if drained.   Electronically Signed   By: Herbie Baltimore M.D.   On: 01/06/2014 16:42

## 2014-01-23 NOTE — Assessment & Plan Note (Signed)
58 y/o male with cirrhosis in setting of chronic HCV, etoh abuse, recent etoh hepatitis. No etoh since discharge from hospital. C/o anasarca, desires another LVAP. Aware he will need Hep A/B vaccinations, hopefully can be done by new PCP. We will arrange for another LVAP with fluid analysis and albumin if appropriate. Start low dose lasix/aldactone. Will have patient return to office in 4 weeks for f/u with Dr. Darrick PennaFields.   He will need MRI liver to follow up on liver lesion. EGD in about 3 months, coordinate with hematology given Xarelto use. Consider nonselective beta blocker in the near future.

## 2014-01-23 NOTE — Progress Notes (Signed)
Patient will need CMET, CBC in 2 weeks. Dx: high risk med

## 2014-01-23 NOTE — Patient Instructions (Signed)
1. Start furosemide and spironolactone (fluid pills). You will take one of each every morning. 2. The day you have fluid drawn off of your belly, DO NOT take your fluid pills. 3. You need to be vaccinated against Hep A and B at your [facility] ([facility] 4. We have scheduled you to have fluid drawn off of your belly. The next time you need to have this done, call us at [phone]. 5. Return to our office in four weeks for follow up.  6. Keep up the good work in NO ALCOHOL!

## 2014-01-24 ENCOUNTER — Telehealth: Payer: Self-pay | Admitting: *Deleted

## 2014-01-24 NOTE — Telephone Encounter (Signed)
Pt is aware of appointment on Monday at 1:00. He is also aware to hold his Xarelto on Sunday and Monday.

## 2014-01-24 NOTE — Telephone Encounter (Signed)
We need to make sure this is addressed before close of office today. I will be leaving at noon as well.

## 2014-01-24 NOTE — Telephone Encounter (Signed)
CORRECTION: Hold Sunday and Monday. Hold Tuesday, which is paracentesis. Resume Wednesday.

## 2014-01-24 NOTE — Telephone Encounter (Signed)
[name] did not put Mr Darren Abbott on Xarelto.  Looks like he was started on it by Hematology and has an appt with them 02/05/14.  Please refer this to them. Thanks Misty StanleyLisa

## 2014-01-24 NOTE — Telephone Encounter (Signed)
Thank you so much, Darren Abbott! Ginger, please see what was written. He can hold it Sunday and Monday as requested. Would also hold on Tuesday as well, then resume Wednesday.

## 2014-01-24 NOTE — Telephone Encounter (Signed)
Pt is schedule for his PARA on 01/27/14 @ 1:00. Waiting to hear about his medication before I call patient.

## 2014-01-24 NOTE — Progress Notes (Signed)
cc'ed to pcp °

## 2014-01-24 NOTE — Telephone Encounter (Signed)
I called [facility] and was told that this pt is seen in [place] and that [place]. I called her at [phone] and left message with Vickie asking if pt could hold Xarelto on Sunday and Monday ( having a paracentesis on Mon 01/27/2014.  She will have Misty StanleyLisa return my call.

## 2014-01-24 NOTE — Telephone Encounter (Signed)
[name] from [facility] office called stating that [name] is going to have a Paracentesis on 01-27-14. Doctor is requesting to find out if patient can hold Xarelto 15 mg on Sunday and Monday.  Please call [name] at [phone].

## 2014-01-24 NOTE — Telephone Encounter (Signed)
Discussed with Ginger:  I have gotten confused regarding the date of the actual procedure. Final recommendations: HOLD Sunday AND Monday. Resume Tuesday.

## 2014-01-24 NOTE — Telephone Encounter (Signed)
Treat Xarelto just like you would with Lovenox for low-risk bleeding procedures (ie Paracentesis).  It is cleared within 24 hours in someone with normal CrCl, so it is typically held 24 hours prior to procedure and can be restarted 24 hours after procedure for low-risk bleeding interventions.   ###Unless surgeon or interventionalist wants the patient off anticoagulation longer.  In this particular patient's case, if the interventionalist wants a longer time off of Xarelto, I would oblige and not bridge him at all with LMWH; it can be held 48 hours prior to procedure and restarted 24-48 hours after procedure. ###  High risk of bleeding procedures, Xarelto is held 48-96 hours prior to surgery and restarted 48-72 hours after surgery with LMWH bridging if necessary.  Let me know if you need anything else.  Jenita Seashoreom Kefalas

## 2014-01-24 NOTE — Telephone Encounter (Signed)
Paracentesis is on Monday at 1:00. So he would hold it Sunday and Monday.Right

## 2014-01-27 ENCOUNTER — Other Ambulatory Visit: Payer: Self-pay

## 2014-01-27 ENCOUNTER — Ambulatory Visit (HOSPITAL_COMMUNITY)
Admission: RE | Admit: 2014-01-27 | Discharge: 2014-01-27 | Disposition: A | Discharge status: Home / Self Care | Payer: Medicaid Other | Source: Ambulatory Visit | Attending: Gastroenterology | Admitting: Gastroenterology

## 2014-01-27 ENCOUNTER — Encounter (HOSPITAL_COMMUNITY): Payer: Self-pay

## 2014-01-27 VITALS — BP 124/74 | HR 60 | Temp 98.1°F | Resp 16

## 2014-01-27 DIAGNOSIS — R188 Other ascites: Secondary | ICD-10-CM

## 2014-01-27 DIAGNOSIS — K7031 Alcoholic cirrhosis of liver with ascites: Secondary | ICD-10-CM | POA: Diagnosis not present

## 2014-01-27 LAB — BODY FLUID CELL COUNT WITH DIFFERENTIAL
EOS FL: 0 %
LYMPHS FL: 49 %
MONOCYTE-MACROPHAGE-SEROUS FLUID: 27 % — AB (ref 50–90)
Neutrophil Count, Fluid: 24 % (ref 0–25)
Total Nucleated Cell Count, Fluid: 145 cu mm (ref 0–1000)

## 2014-01-27 NOTE — Telephone Encounter (Signed)
See separate phone note. Tobi Bastosnna addressed with Elijah Birkom.

## 2014-01-27 NOTE — Procedures (Signed)
PreOperative Dx: Alcoholic cirrhosis, ascites Postoperative Dx: Alcoholic cirrhosis, ascites Procedure:   US guided paracentesis Radiologist:  Tyron RussellBoles Anesthesia:  10 ml of 1% lidocaine Specimen:  3100 ml of yellow ascitic fluid EBL:   < 1 ml Complications: None

## 2014-01-27 NOTE — Progress Notes (Signed)
Paracentesis complete no signs of distress. 3100 ml yellow colored ascites removed.   

## 2014-01-28 ENCOUNTER — Other Ambulatory Visit: Payer: Self-pay

## 2014-01-28 DIAGNOSIS — K7031 Alcoholic cirrhosis of liver with ascites: Secondary | ICD-10-CM

## 2014-01-28 NOTE — Progress Notes (Signed)
I called and left message on VM that pt needs labs in a couple of weeks and his orders have been faxed to [facility] and to please call and let me know that he got the message.

## 2014-01-29 NOTE — Progress Notes (Signed)
Quick Note:  Fluid analysis negative. ______

## 2014-01-31 LAB — BODY FLUID CULTURE
CULTURE: NO GROWTH
GRAM STAIN: NONE SEEN

## 2014-02-01 LAB — ANAEROBIC CULTURE: GRAM STAIN: NONE SEEN

## 2014-02-04 NOTE — Progress Notes (Signed)
LMOM for pt to let me know that he got the message to do his labs.

## 2014-02-05 ENCOUNTER — Encounter (HOSPITAL_COMMUNITY): Payer: Medicaid Other | Attending: Hematology and Oncology

## 2014-02-05 ENCOUNTER — Encounter (HOSPITAL_COMMUNITY): Payer: Self-pay

## 2014-02-05 VITALS — BP 105/84 | HR 63 | Temp 97.9°F | Resp 18 | Wt 155.2 lb

## 2014-02-05 DIAGNOSIS — K7460 Unspecified cirrhosis of liver: Secondary | ICD-10-CM

## 2014-02-05 DIAGNOSIS — I81 Portal vein thrombosis: Secondary | ICD-10-CM

## 2014-02-05 DIAGNOSIS — B1920 Unspecified viral hepatitis C without hepatic coma: Secondary | ICD-10-CM | POA: Diagnosis not present

## 2014-02-05 DIAGNOSIS — D731 Hypersplenism: Secondary | ICD-10-CM

## 2014-02-05 DIAGNOSIS — Z23 Encounter for immunization: Secondary | ICD-10-CM

## 2014-02-05 DIAGNOSIS — K7031 Alcoholic cirrhosis of liver with ascites: Secondary | ICD-10-CM

## 2014-02-05 DIAGNOSIS — R188 Other ascites: Secondary | ICD-10-CM

## 2014-02-05 DIAGNOSIS — K7030 Alcoholic cirrhosis of liver without ascites: Secondary | ICD-10-CM | POA: Diagnosis not present

## 2014-02-05 LAB — CBC WITH DIFFERENTIAL/PLATELET
BASOS ABS: 0 10*3/uL (ref 0.0–0.1)
Basophils Relative: 1 % (ref 0–1)
EOS PCT: 7 % — AB (ref 0–5)
Eosinophils Absolute: 0.3 10*3/uL (ref 0.0–0.7)
HEMATOCRIT: 40.8 % (ref 39.0–52.0)
HEMOGLOBIN: 14.2 g/dL (ref 13.0–17.0)
Lymphocytes Relative: 21 % (ref 12–46)
Lymphs Abs: 1 10*3/uL (ref 0.7–4.0)
MCH: 31.6 pg (ref 26.0–34.0)
MCHC: 34.8 g/dL (ref 30.0–36.0)
MCV: 90.7 fL (ref 78.0–100.0)
MONOS PCT: 14 % — AB (ref 3–12)
Monocytes Absolute: 0.7 10*3/uL (ref 0.1–1.0)
Neutro Abs: 2.9 10*3/uL (ref 1.7–7.7)
Neutrophils Relative %: 58 % (ref 43–77)
Platelets: 80 10*3/uL — ABNORMAL LOW (ref 150–400)
RBC: 4.5 MIL/uL (ref 4.22–5.81)
RDW: 14.3 % (ref 11.5–15.5)
WBC: 5 10*3/uL (ref 4.0–10.5)

## 2014-02-05 LAB — FERRITIN: Ferritin: 132 ng/mL (ref 22–322)

## 2014-02-05 LAB — D-DIMER, QUANTITATIVE: D-Dimer, Quant: 5.97 ug/mL-FEU — ABNORMAL HIGH (ref 0.00–0.48)

## 2014-02-05 MED ORDER — INFLUENZA VAC SPLIT QUAD 0.5 ML IM SUSY
PREFILLED_SYRINGE | INTRAMUSCULAR | Status: AC
  Filled 2014-02-05: qty 0.5

## 2014-02-05 MED ORDER — OXYCODONE HCL 5 MG PO TABS: 5.0000 mg | ORAL_TABLET | ORAL | Status: AC | PRN

## 2014-02-05 MED ORDER — INFLUENZA VAC SPLIT QUAD 0.5 ML IM SUSY
0.5000 mL | PREFILLED_SYRINGE | Freq: Once | INTRAMUSCULAR | Status: AC
  Administered 2014-02-05: 0.5 mL via INTRAMUSCULAR

## 2014-02-05 NOTE — Progress Notes (Signed)
Darren Abbott presented for labwork. Labs per MD order drawn via Peripheral Line 21 gauge needle inserted in left AC  Good blood return present. Procedure without incident.  Needle removed intact. Patient tolerated procedure well.

## 2014-02-05 NOTE — Patient Instructions (Signed)
[facility] Discharge Instructions  RECOMMENDATIONS MADE BY THE CONSULTANT AND ANY TEST RESULTS WILL BE SENT TO YOUR REFERRING PHYSICIAN.  EXAM FINDINGS BY THE PHYSICIAN TODAY AND SIGNS OR SYMPTOMS TO REPORT TO CLINIC OR PRIMARY PHYSICIAN: Exam and findings as discussed by Dr. Zigmund DanielFormanek.  The lactulose is what's making your bowels move.  It's to keep your ammonia levels down so you don't get confused. MD will give you something for pain that will not damage your liver.  Will give you a flu vaccine today.  MEDICATIONS PRESCRIBED:  Oxycodone for pain - take as directed  INSTRUCTIONS/FOLLOW-UP: 1 month with labs and office visit.  Thank you for choosing [facility] to provide your oncology and hematology care.  To afford each patient quality time with our providers, please arrive at least 15 minutes before your scheduled appointment time.  With your help, our goal is to use those 15 minutes to complete the necessary work-up to ensure our physicians have the information they need to help with your evaluation and healthcare recommendations.    Effective January 1st, 2014, we ask that you re-schedule your appointment with our physicians should you arrive 10 or more minutes late for your appointment.  We strive to give you quality time with our providers, and arriving late affects you and other patients whose appointments are after yours.    Again, thank you for choosing [facility].  Our hope is that these requests will decrease the amount of time that you wait before being seen by our physicians.       _____________________________________________________________  Should you have questions after your visit to [facility], please contact our office at [phone] between the hours of 8:30 a.m. and 4:30 p.m.  Voicemails left after 4:30 p.m. will not be returned until the following business day.  For prescription refill requests, have your  pharmacy contact our office with your prescription refill request.    _______________________________________________________________  We hope that we have given you very good care.  You may receive a patient satisfaction survey in the mail, please complete it and return it as soon as possible.  We value your feedback!  _______________________________________________________________  Have you asked about our STAR program?  STAR stands for Survivorship Training and Rehabilitation, and this is a nationally recognized cancer care program that focuses on survivorship and rehabilitation.  Cancer and cancer treatments may cause problems, such as, pain, making you feel tired and keeping you from doing the things that you need or want to do. Cancer rehabilitation can help. Our goal is to reduce these troubling effects and help you have the best quality of life possible.  You may receive a survey from a nurse that asks questions about your current state of health.  Based on the survey results, all eligible patients will be referred to the [facility] for an evaluation so we can better serve you!  A frequently asked questions sheet is available upon request.

## 2014-02-05 NOTE — Progress Notes (Signed)
[facility]  OFFICE PROGRESS NOTE  Augustine RadarOBERSON, KRISTINA, MD [address] [place] [id number]  DIAGNOSIS: Portal vein thrombosis - Plan: CBC with Differential, D-dimer, quantitative, CBC with Differential, D-dimer, quantitative, D-dimer, quantitative  Alcoholic cirrhosis of liver with ascites  Hypersplenism, with thrombocytopenia - Plan: Ferritin, Soluble transferrin receptor, Ferritin, Soluble transferrin receptor  Cirrhosis of liver with ascites, unspecified hepatic cirrhosis type, with hepatitis C  Chief Complaint  Patient presents with  . Portal vein thrombosis  . Alcoholic cirrhosis  . Hypersplenism with thrombocytopenia    CURRENT THERAPY: Xarelto 15 mg daily.  INTERVAL HISTORY: Darren Abbott 58 y.o. male returns for followup after recent hospitalization at which time portal vein thrombosis was discovered in the setting of a alcohol-induced cirrhosis and hepatitis C. He was started on anticoagulation with 15 mg daily of Xarelto without loading dose.  He denies any nausea, vomiting, but does have diarrhea from lactulose. He denies any epistaxis, melena, hematochezia, hematuria, or lower extremity swelling or redness. Abdominal swelling or so present with pain in the right upper quadrant. He denies any back pain, fever, night sweats, sore throat, headache, or seizures.  MEDICAL HISTORY: Past Medical History  Diagnosis Date  . Malnutrition of moderate degree   . Altered mental status   . Unspecified essential hypertension   . Anxiety state, unspecified   . Transient ischemic attack (TIA), and cerebral infarction without residual deficits(V12.54)   . Body mass index between 19-24, adult   . Atrial fibrillation     episode at [place].  Marland Kitchen. Heart murmur   . Obstructive sleep apnea (adult) (pediatric)     to start back after surgery- not used for 2-273months  . GERD (gastroesophageal reflux disease)   . Unspecified epilepsy without  mention of intractable epilepsy 13    only once  . Hand laceration 80    end of all fingers on rt hand  . Old myocardial infarction   . Asthma     "outgrew it"  . Borderline diabetes   . 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 Acute alcoholic hepatitis     possible hepatic encephalopathy May 10, 2011  . Daily headache   . Migraine     "daily" (08/30/2013)  . Stroke ~ 2000    "mild"  . Arthritis     "I'm ate up w/it" (08/30/2013)  . Chronic lower back pain   . On home oxygen therapy     "2L prn" (08/30/2013)  . Chronic kidney disease     "one isn't functioning like it should" (08/30/2013)  . Cirrhosis   . Esophageal varices   . Thrombus     chronic,junction of splenic portal vein  . Pulmonary nodules     chronic an new 2015  . 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    ALLERGIES:  has No Known Allergies.  MEDICATIONS: has a current medication list which includes  the following prescription(s): folic acid, furosemide, lactulose, metoprolol, multiple vitamins-minerals, ondansetron, oxazepam, pantoprazole, rivaroxaban, spironolactone, and oxycodone.  SURGICAL HISTORY:  Past Surgical History  Procedure Laterality Date  . Clavicle surgery Left ~ 2008    "crushed it"  . Hand surgery Left      lacerations of thumb. reattached  . Nasal septum surgery  08/30/2013  . Rhinoplasty  08/30/2013  . Hand surgery Right 1980's    "all 4 fingers cut off and reattached"  . Foot fracture surgery Right   . Septoplasty N/A 08/30/2013    Procedure: SEPTOPLASTY;  Surgeon: Serena Colonel, MD;  Location[facility] OR;  Service: ENT;  Laterality: N/A;  . Rhinoplasty N/A 08/30/2013    Procedure: RHINOPLASTY;  Surgeon: Serena Colonel, MD;  Location: [facility] OR;  Service: ENT;  Laterality: N/A;  . Esophagogastroduodenoscopy  Dec 2014    Dr. Samuella Cota: Grade 2-3 esophageal varices s/p banding X 5. Erosive gastritis. Negative H.pylori  . Paracentesis      multiple    FAMILY HISTORY: family history includes Cancer in his mother. There is no history of Colon cancer.  SOCIAL HISTORY:  reports that he has been smoking Cigarettes.  He has a 11 pack-year smoking history. He has never used smokeless tobacco. He reports that he drinks alcohol. He reports that he does not use illicit drugs.  REVIEW OF SYSTEMS:  Other than that discussed above is noncontributory.  PHYSICAL EXAMINATION: ECOG PERFORMANCE STATUS: 1 - Symptomatic but completely ambulatory  Blood pressure 105/84, pulse 63, temperature 97.9 F (36.6 C), temperature source Oral, resp. rate 18, weight 155 lb 3.2 oz (70.398 kg), SpO2 100.00%.  GENERAL:alert, no distress and comfortable SKIN: skin color, texture, turgor are normal, no rashes or significant lesions EYES: PERLA; Conjunctiva are pink and non-injected, sclera clear SINUSES: No redness or tenderness over maxillary or ethmoid sinuses OROPHARYNX:no exudate, no erythema on lips, buccal mucosa, or tongue. NECK: supple, thyroid normal size, non-tender, without nodularity. No masses CHEST: Bilateral gynecomastia with spider angiomata. LYMPH:  no palpable lymphadenopathy in the cervical, axillary or inguinal LUNGS: clear to auscultation and percussion with normal breathing effort HEART: regular rate & rhythm and no  murmurs. ABDOMEN:abdomen soft, non-tender and normal bowel sounds. Positive fluid wave and shifting dullness. Spleen not ballotable MUSCULOSKELETAL:no cyanosis of digits and no clubbing. Range of motion normal.  NEURO: alert & oriented x 3 with fluent speech, no focal motor/sensory deficits. No evidence of asterixis.   LABORATORY DATA: Office Visit on 02/05/2014  Component Date Value Ref Range Status  . WBC 02/05/2014 5.0  4.0 - 10.5 K/uL Final  . RBC 02/05/2014 4.50  4.22 - 5.81 MIL/uL Final  . Hemoglobin 02/05/2014 14.2  13.0 - 17.0 g/dL Final  . HCT 16/01/9603 40.8  39.0 - 52.0 % Final  . MCV 02/05/2014 90.7  78.0 - 100.0 fL Final  . MCH 02/05/2014 31.6  26.0 - 34.0 pg Final  . MCHC 02/05/2014 34.8  30.0 - 36.0 g/dL Final  . RDW 54/12/8117 14.3  11.5 - 15.5 % Final  . Platelets 02/05/2014 80* 150 - 400 K/uL Final   Comment: SPECIMEN CHECKED FOR CLOTS                          CONSISTENT WITH PREVIOUS RESULT  . Neutrophils Relative % 02/05/2014 58  43 - 77 % Final  . Neutro Abs 02/05/2014 2.9  1.7 - 7.7 K/uL Final  . Lymphocytes Relative 02/05/2014 21  12 -  46 % Final  . Lymphs Abs 02/05/2014 1.0  0.7 - 4.0 K/uL Final  . Monocytes Relative 02/05/2014 14* 3 - 12 % Final  . Monocytes Absolute 02/05/2014 0.7  0.1 - 1.0 K/uL Final  . Eosinophils Relative 02/05/2014 7* 0 - 5 % Final  . Eosinophils Absolute 02/05/2014 0.3  0.0 - 0.7 K/uL Final  . Basophils Relative 02/05/2014 1  0 - 1 % Final  . Basophils Absolute 02/05/2014 0.0  0.0 - 0.1 K/uL Final  . D-Dimer, Quant 02/05/2014 5.97* 0.00 - 0.48 ug/mL-FEU Final   Comment:                                 AT THE INHOUSE ESTABLISHED CUTOFF                          VALUE OF 0.48 ug/mL FEU,                          THIS ASSAY HAS BEEN DOCUMENTED                          IN THE LITERATURE TO HAVE                          A SENSITIVITY AND NEGATIVE                          PREDICTIVE VALUE OF AT LEAST                          98 TO 99%.   THE TEST RESULT                          SHOULD BE CORRELATED WITH                          AN ASSESSMENT OF THE CLINICAL                          PROBABILITY OF DVT / VTE.  Hospital Outpatient Visit on 01/27/2014  Component Date Value Ref Range Status  . Specimen Description 01/27/2014 ASCITIC FLUID   Final  . Special Requests 01/27/2014 NONE   Final  . Gram Stain 01/27/2014    Final                   Value:NO WBC SEEN                         NO ORGANISMS SEEN                         Performed at [facility]  . Culture 01/27/2014    Final                   Value:NO ANAEROBES ISOLATED                         Performed at [facility]  . Report Status 01/27/2014 02/01/2014 FINAL   Final  . Fluid Type-FCT 01/27/2014 ASCITIC  Corrected   Comment: FLUID                          CORRECTED ON 10/05 AT 1447: PREVIOUSLY REPORTED AS Body Fluid  . Color, Fluid 01/27/2014 YELLOW  YELLOW Final  . Appearance, Fluid 01/27/2014 CLEAR  CLEAR Final  . WBC, Fluid 01/27/2014 145  0 - 1000 cu mm Final  . Neutrophil Count, Fluid 01/27/2014 24  0 - 25 % Final  . Lymphs, Fluid 01/27/2014 49   Final  . Monocyte-Macrophage-Serous Fluid 01/27/2014 27* 50 - 90 % Final  . Eos, Fluid 01/27/2014 0   Final  . Other Cells, Fluid 01/27/2014 FEW   Final   Comment: OTHER CELLS IDENTIFIED AS MESOTHELIAL CELLS                          FEW REACTIVE CELLS SEEN.   Marland Kitchen Specimen Description 01/27/2014 ASCITIC FLUID   Final  . Special Requests 01/27/2014 NONE   Final  . Gram Stain 01/27/2014    Final                   Value:NO WBC SEEN                         NO ORGANISMS SEEN                         Performed at [facility]  . Culture 01/27/2014    Final                   Value:NO GROWTH 3 DAYS                         Performed at [facility]  . Report Status 01/27/2014 01/31/2014 FINAL   Final  Admission on 01/06/2014, Discharged on 01/10/2014  No results displayed because visit has  over 200 results.      PATHOLOGY:  for GAVAN, NORDBY ([id number]) Patient: LOMAX, POEHLER Collected: 01/27/2014 Client: [facility] Accession: [id number] Received: 01/28/2014 Ulyses Southward DOB: July 10, 1955 Age: 71 Gender: M Reported: 01/29/2014 [address] Patient Ph: [phone] MRN#: [id number] [place] [address] Client Acc#: Chart: Phone: [phone] Fax: LMP: Visit#: [id number] CC: Tiffany Kocher, PA-C CYTOPATHOLOGY REPORT Adequacy Reason Satisfactory For Evaluation. Diagnosis PERITONEAL/ASCITIC FLUID, RLQ(SPECIMEN 1 OF 1 COLLECTED 01/27/14): REACTIVE MESOTHELIAL CELLS PRESENT. Abigail Miyamoto MD Pathologist, Electronic Signature (Case signed 01/29/2014) Source Peritoneal/Ascitic Fluid, RLQ (specimen 1 of 1 collected 01/27/14) Gross Specimen: Received is/are 150cc's of yellow fluid. (BS:bs) Prepared: # Smears: 0 # Concentration Technique Slides (i.e. ThinPrep): 1 # Cell Block: 1 Additional Studies: n/a Report signed out from the following location(s) Technical Component and Interpretation performed at [facility] [address], [place], [place] [address]. CLIA #: [id number],    Urinalysis No results found for this basename: colorurine,  appearanceur,  labspec,  phurine,  glucoseu,  hgbur,  bilirubinur,  ketonesur,  proteinur,  urobilinogen,  nitrite,  leukocytesur    RADIOGRAPHIC STUDIES: Ct Chest W Contrast  01/08/2014   CLINICAL DATA:  Tobacco use. Blood clot, query occult malignancy. Long smoking history.  EXAM: CT CHEST WITH CONTRAST  TECHNIQUE: Multidetector CT imaging of the chest was performed during intravenous contrast administration.  CONTRAST:  80mL OMNIPAQUE IOHEXOL 300 MG/ML  SOLN  COMPARISON:  01/06/2014  FINDINGS: Please note that today's exam was not performed using the pulmonary embolus protocol. If this was in error, please alert the [facility] Radiology Department and we will have the patient return for repeat  imaging using CT angiogram technique. Right hilar lymph node, 0.9 cm in short axis. Subcarinal lymph node, 1.3 cm in short axis. Small AP window lymph nodes are observed. Distal esophageal wall thickening is suspected with some low-density lower paraesophageal lymph nodes and potentially some small paraesophageal varices.  Hepatic cirrhosis is observed with diffuse mesenteric and omental edema with slight associated nodularity as reported on prior CT abdomen. Perihepatic ascites noted. Splenomegaly is partially shown. In the part of the liver that we included, there is a 1.1 x 0.8 cm arterial phase enhancing nodule posteriorly in segment 6 of the liver, image 60 series 2.  Centrilobular emphysema. 5 x 3 mm right upper lobe nodule, image 24 series 3, no change from 03/01/2010. 3 mm triangular-shaped nodule, right upper lobe, image 29 series 3, not readily seen on the prior chest CT. 0.5 cm subpleural lymph node along the left major fissure, image 24 series 3, no change from 03/01/2010. Dependent subsegmental atelectasis or scarring is present in both lower lobes.  Left clavicular plate and screw fixator. Thoracic spondylosis without malalignment.  IMPRESSION: 1. Arterial phase enhancing 1.1 x 0.8 cm lesion in segment 6 of the liver in this patient with cirrhosis. I cannot exclude a small hepatic cellular carcinoma. Consider hepatic protocol MRI with and without contrast for confirmation and assessment. 2. No lung cancer is observed. There is several small pulmonary nodules in the 5 mm range which are stable from 2011 and considered benign. There is a 3 mm triangular shaped nodule in the right upper lobe which I do not see on the prior 2011 exam in which it probably be followed. I would suggest a followup chest CT in 1 years time given that the patient does appear to have risk factors for malignancy. This recommendation follows the consensus statement: Guidelines for Management of Small Pulmonary Nodules Detected on  CT Scans: A Statement from the Fleischner Society as published in Radiology 2005; 237:395-400. 3. Centrilobular emphysema. 4. Upper abdominal ascites and mesenteric and omental edema/infiltration. 5. Distal esophageal wall thickening -esophagitis is not excluded. 6. Splenomegaly. 7. Please note that today's exam was not performed using the pulmonary embolus protocol. If this was in error, please alert the [facility] Radiology Department and we will have the patient return for repeat imaging using CT angiogram technique.   Electronically Signed   By: Herbie Baltimore M.D.   On: 01/08/2014 09:34   Mr Abdomen W Wo Contrast  01/08/2014   CLINICAL DATA:  Evaluate liver lesion on CT. Cirrhosis with portal vein thrombosis.  EXAM: MRI ABDOMEN WITHOUT AND WITH CONTRAST  TECHNIQUE: Multiplanar multisequence MR imaging of the abdomen was performed both before and after the administration of intravenous contrast.  CONTRAST:  14mL MULTIHANCE GADOBENATE DIMEGLUMINE 529 MG/ML IV SOLN  COMPARISON:  CT chest dated 01/08/2014. CT abdomen pelvis dated 01/06/2014. CT abdomen pelvis dated 12/30/2013.  FINDINGS: Severely motion degraded images.  Cirrhotic configuration of the liver. Heterogeneous perfusion involving the posterior hepatic dome (series 5010/ image 26), nonspecific. Inferiorly in the posterior right hepatic lobe, in the region of the CT abnormality, an underlying focal lesion cannot be confidently identified due to severe motion degradation (series 5010/image 40). No definite corresponding restricted diffusion.  Splenomegaly, measuring 14.1 cm.  Pancreas and adrenal glands  are grossly unremarkable.  Gallbladder is mildly thick-walled but underdistended (series 8/ image 33), without definite cholelithiasis. No intrahepatic or extrahepatic ductal dilatation.  Kidneys are within normal limits.  No hydronephrosis.  Moderate abdominal ascites.  Portal vein thrombosis (series 5011/ image 40), although better evaluated on CT.  Gastroesophageal varices.  No focal osseous lesions.  IMPRESSION: Markedly limited ev aluation with severely motion degraded images.  Heterogeneous perfusion involving the posterior hepatic dome. Enhancing lesion inferiorly in the posterior right hepatic lobe on CT cannot be confidently identified on MR. Further evaluation in the acute setting is of questionable clinical utility. Consider follow-up triple phase CT or repeat MR abdomen with/without contrast as an outpatient.  Portal vein thrombosis.  Splenomegaly.  Gastroesophageal varices.  Moderate abdominal ascites.   Electronically Signed   By: Charline Bills M.D.   On: 01/08/2014 18:09   US Abdomen Complete  01/09/2014   CLINICAL DATA:  Portal venous thrombosis.  EXAM: ULTRASOUND ABDOMEN COMPLETE  COMPARISON:  None.  FINDINGS: Gallbladder:  No gallstones are noted. Gallbladder wall thickness is noted measured at 7.5 mm most likely due to surrounding ascites or adjacent hepatocellular disease. No sonographic Murphy's sign is noted.  Common bile duct:  Diameter: 6.7 mm which is within normal limits.  Liver:  Nodular contours of of hepatic parenchyma are noted consistent with hepatic cirrhosis. No definite focal abnormality seen.  IVC:  No abnormality visualized.  Pancreas:  Not visualized due to overlying bowel gas.  Spleen:  Has a maximum measured diameter 14.3 cm with calculated volume of 895 cubic cm consistent with moderate splenomegaly.  Right Kidney:  Length: 13.6 cm. Echogenicity within normal limits. No mass or hydronephrosis visualized.  Left Kidney:  Length: 13.7 cm. Echogenicity within normal limits. No mass or hydronephrosis visualized.  Abdominal aorta:  Not visualized due to overlying bowel gas.  Other findings:  Moderate ascites is noted.  IMPRESSION: Moderate ascites is noted. Hepatic cirrhosis with associated splenomegaly. Gallbladder wall thickening is noted without cholelithiasis; this most likely is due to surrounding ascites or adjacent  hepatocellular disease.   Electronically Signed   By: Roque Lias M.D.   On: 01/09/2014 16:16   Ct Abdomen Pelvis W Contrast  01/06/2014   CLINICAL DATA:  Abdominal pain right upper quadrant. Paracentesis 1-1/2 weeks ago.  EXAM: CT ABDOMEN AND PELVIS WITH CONTRAST  TECHNIQUE: Multidetector CT imaging of the abdomen and pelvis was performed using the standard protocol following bolus administration of intravenous contrast.  CONTRAST:  OMNIPAQUE IOHEXOL 300 MG/ML  SOLN  COMPARISON:  12/30/2013, 11/18/2013 and 09/14/2008  FINDINGS: Lung bases are unremarkable.  Abdominal images demonstrate a small liver with nodular contour or unchanged. There is mild splenomegaly. Findings are compatible with cirrhosis. There is no change in chronic thrombus over the junction of the splenic vein to portal vein and throughout the main portal vein and left portal vein. There is moderate ascites with slight interval improvement. Gallbladder is contracted. The pancreas and adrenal glands are within normal. There are a few small nonspecific lymph nodes over the upper abdomen and periaortic region without significant change. Kidneys normal in size without hydronephrosis or nephrolithiasis. There is a sub cm hypodensity over the upper pole of the right kidney unchanged and likely a cyst, but too small to characterize. There is calcified plaque over the abdominal aorta and iliac arteries. Appendix is within normal.  There is mild prominence of several proximal jejunal loops without significant change. There is diverticulosis of the colon. There  is wall thickening of the ascending colon which may be related to patient's liver failure and hypoproteinemia, although cannot exclude an acute colitis.  Pelvic images demonstrate a normal rectum, bladder and prostate. Stable L3 and L4 compression fractures. Degenerative changes of the spine and hips.  IMPRESSION: Continued evidence of cirrhosis with portal venous hypertension and moderate  ascites. Ascites is slightly improved compared to the previous exam. 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  Mild wall thickening of the ascending colon which may be secondary to patient's cirrhosis with hypoproteinemia, although cannot exclude an acute colitis.  Chronic prominence of the proximal jejunal loops.  Stable the L3 and L4 compression fractures.   Electronically Signed   By: Elberta Fortis M.D.   On: 01/06/2014 20:05   [place] Art/ven Flow Abd Pelv Doppler  01/09/2014   CLINICAL DATA:  Cirrhosis, portal hypertension and refractory ascites. CT has demonstrated thrombus in the main portal vein extending into the left lobe of the liver.  EXAM: DUPLEX ULTRASOUND OF LIVER  TECHNIQUE: Color and duplex Doppler ultrasound was performed to evaluate the hepatic in-flow and out-flow vessels.  COMPARISON:  CT of the abdomen on 01/06/2014.  FINDINGS: Portal Vein Velocities  Main:  Maximum measured velocity of 54 cm cm/sec.  Right:  24 cm/sec  Left:  14 cm/sec  Hepatic Vein Velocities  Right:  50 cm/sec  Middle:  34 cm/sec  Left:  71 cm/sec  Hepatic Artery Velocity: The hepatic artery could not be directly sampled.  Splenic Vein Velocity:  30 cm/sec  Varices: None visualized.  Ascites: Moderate ascites identified.  There is a significant amount of thrombus in the main portal vein which nearly occludes the lumen. Some flow is identified around the thrombus and into the liver. Nonocclusive thrombus is identified in the left portal vein. No significant thrombus is identified in the right portal vein. Main portal vein sampling shows flow direction towards the liver.  The hepatic veins are normally patent. The spleen is significantly enlarged with estimated volume of 895 mL.  IMPRESSION: Significant amount of thrombus in the main portal vein that extends into the intrahepatic left portal vein. By ultrasound, the main portal vein lumen is mostly  filled with thrombus with some flow was seen around the thrombus. There is no evidence of splenic vein thrombosis or hepatic veno-occlusive disease.   Electronically Signed   By: Irish Lack M.D.   On: 01/09/2014 16:41   US Paracentesis  01/27/2014   CLINICAL DATA:  Alcoholic cirrhosis, ascites  EXAM: ULTRASOUND GUIDED DIAGNOSTIC AND THERAPEUTIC PARACENTESIS  COMPARISON:  01/09/2014  PROCEDURE: Procedure, benefits, and risks of procedure were discussed with patient.  Written informed consent for procedure was obtained.  Time out protocol followed.  Adequate collection of ascites localized by ultrasound in RIGHT lower quadrant.  Skin prepped and draped in usual sterile fashion.  Skin and soft tissues anesthetized with 10 mL of 1% lidocaine.  5 [place] Yueh catheter placed into peritoneal cavity.  3100 mL of yellow fluid aspirated by vacuum bottle suction.  Procedure tolerated well by patient without immediate complication.  FINDINGS: A total of approximately 3100 mL of ascitic fluid was removed. A fluid sample of 180 mL was sent for laboratory analysis.  IMPRESSION: Successful ultrasound guided paracentesis yielding 3100 mL of ascites.   Electronically Signed   By: Ulyses Southward M.D.   On: 01/27/2014 15:09   US Paracentesis  01/09/2014  CLINICAL DATA:  Ascites  EXAM: ULTRASOUND GUIDED THERAPEUTIC PARACENTESIS  COMPARISON:  None.  PROCEDURE: An ultrasound guided paracentesis was thoroughly discussed with the patient and questions answered. The benefits, risks, alternatives and complications were also discussed. The patient understands and wishes to proceed with the procedure. Written consent was obtained.  Ultrasound was performed to localize and mark an adequate pocket of fluid in the right quadrant of the abdomen. The area was then prepped and draped in the normal sterile fashion. 1% Lidocaine was used for local anesthesia. Under ultrasound guidance a 19 gauge Yueh catheter was introduced. Paracentesis was  performed. The catheter was removed and a dressing applied.  Complications: None.  FINDINGS: A total of approximately 2500 mL of yellow fluid was removed. A fluid sample was not sent for laboratory analysis.  IMPRESSION: Successful ultrasound guided paracentesis yielding 2500 mL of ascites.   Electronically Signed   By: Elige Ko   On: 01/09/2014 15:47   Dg Chest Port 1 View  01/06/2014   CLINICAL DATA:  Shortness of breath, smoker, hypertension, abdominal pain  EXAM: PORTABLE CHEST - 1 VIEW  COMPARISON:  12/17/2013  FINDINGS: Left clavicular hardware. Bilateral AC joint degenerative change noted. Heart size is normal. Curvilinear left lower lobe atelectasis or scarring. Lungs are hypoaerated with crowding of the bronchovascular markings. No focal pulmonary opacity otherwise. No pleural effusion.  IMPRESSION: Low lung volumes bilaterally with crowding of the bronchovascular markings and left lower lobe atelectasis.   Electronically Signed   By: Christiana Pellant M.D.   On: 01/06/2014 15:39   US Abdomen Limited Ruq  01/06/2014   CLINICAL DATA:  Ascites.  Cirrhosis and hepatitis.  EXAM: LIMITED ABDOMEN ULTRASOUND FOR ASCITES  TECHNIQUE: Limited ultrasound survey for ascites was performed in all four abdominal quadrants.  COMPARISON:  Multiple exams, including 12/31/2013 and 12/30/2013  FINDINGS: Small amount of ascites noted in the right upper quadrant, but not enough to provide a therapeutic benefit if drained. Accordingly, paracentesis was not performed.  IMPRESSION: 1. Small amount of ascites in the right upper quadrant, not judged to provide a significant therapeutic benefit if drained.   Electronically Signed   By: Herbie Baltimore M.D.   On: 01/06/2014 16:42    ASSESSMENT:  #1. Portal vein thrombosis, controlled with Xarelto 15 mg daily. #2. 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 #4. Currently abstaining from alcohol.   PLAN:  #1. Oxycodone one or 2 tablets  every 4 hours to control pain. #2. Influenza virus vaccine was given today. #3. Followup in 4 weeks with CBC, side effects and receptor, and d-dimer.   All questions were answered. The patient knows to call the clinic with any problems, questions or concerns. We can certainly see the patient much sooner if necessary.   I spent 25 minutes counseling the patient face to face. The total time spent in the appointment was 30 minutes.    Maurilio Lovely, MD 02/05/2014 2:35 PM  DISCLAIMER:  This note was dictated with voice recognition software.  Similar sounding words can inadvertently be transcribed inaccurately and may not be corrected upon review.

## 2014-02-09 LAB — SOLUBLE TRANSFERRIN RECEPTOR: Transferrin Receptor, Soluble: 1.86 mg/L — ABNORMAL HIGH (ref 0.76–1.76)

## 2014-02-14 NOTE — Progress Notes (Signed)
LMOM that he was to do labs, [facility] and to call if he has questions.

## 2014-02-18 ENCOUNTER — Other Ambulatory Visit: Payer: Self-pay

## 2014-02-18 ENCOUNTER — Telehealth: Payer: Self-pay

## 2014-02-18 DIAGNOSIS — R188 Other ascites: Secondary | ICD-10-CM

## 2014-02-18 NOTE — Telephone Encounter (Signed)
Yes, that is fine.  Needs Albumin 25 g IV IF greater than 4 liters removed.  Hold Xarelto X 48 hours prior, resume the day after the procedure. fa

## 2014-02-18 NOTE — Telephone Encounter (Signed)
He is swollen and it id hard for him to breath at times. He stated that he is unable to eat much because of it. Could we try and see if they could do it later in the week

## 2014-02-18 NOTE — Telephone Encounter (Signed)
Pt is calling to see if he can have a US PARA.  Please advise

## 2014-02-18 NOTE — Telephone Encounter (Signed)
Pt is set up for PARA on 02/21/14 @ 1:00 and he is aware

## 2014-02-18 NOTE — Telephone Encounter (Signed)
On Xarelto if I'm not mistaken. He will need to hold 48 hours prior to paracentesis, then resume the following day of paracentesis. What are his symptoms?

## 2014-02-21 ENCOUNTER — Ambulatory Visit (HOSPITAL_COMMUNITY)
Admission: RE | Admit: 2014-02-21 | Discharge: 2014-02-21 | Disposition: A | Discharge status: Home / Self Care | Payer: Medicaid Other | Source: Ambulatory Visit | Attending: Gastroenterology | Admitting: Gastroenterology

## 2014-02-21 ENCOUNTER — Encounter (HOSPITAL_COMMUNITY): Payer: Self-pay

## 2014-02-21 VITALS — BP 100/81 | HR 96 | Temp 97.8°F | Resp 16

## 2014-02-21 DIAGNOSIS — K7460 Unspecified cirrhosis of liver: Secondary | ICD-10-CM | POA: Diagnosis not present

## 2014-02-21 DIAGNOSIS — R188 Other ascites: Secondary | ICD-10-CM

## 2014-02-21 NOTE — Procedures (Signed)
PreOperative Dx: Cirrhosis, ascites Postoperative Dx: Cirrhosis, ascites Procedure:   US guided paracentesis Radiologist:  Deadra Diggins Anesthesia:  10 ml of 1% lidocaine Specimen:  3000 ml of yellow ascitic fluid EBL:   < 1 ml Complications: None  

## 2014-02-21 NOTE — Discharge Instructions (Signed)
Ascites Drainage Catheter Placement Care After Refer to this sheet in the next few weeks. These instructions provide you with information on caring for yourself after your procedure. Your caregiver may also give you more specific instructions. Your treatment has been planned according to current medical practices, but problems sometimes occur. Call your caregiver if you have any problems or questions after your procedure. HOME CARE INSTRUCTIONS  Rest for a few days while your skin is healing.  Only take over-the-counter or prescription medicines for pain, fever, or discomfort as directed by your caregiver.  Apply an antiseptic ointment and bandages (dressings) to the wound as directed by your caregiver.  Follow your caregiver's dietary instructions. Your caregiver will help you to understand the foods, liquids, and amount of sodium that can increase or decrease fluid retention. You may be asked to eat a high protein diet following the procedure. You may be asked to avoid alcohol and restrict sodium intake.  Return to your caregiver for blood tests and other tests as directed.  See your caregiver for removal of stitches in 4 to 5 days, or as directed.  You may be asked to drain fluid at home. Follow your caregiver's instructions. Draining the fluid will help you to avoid pain and other symptoms.  Check your weight every day. SEEK MEDICAL CARE IF:  You have trouble caring for your catheter.  You develop chills.  You notice bleeding, skin irritation, drainage, redness, or pain around the insertion site.  You have abdominal pain, bloating, pressure, or cramping.  You notice that fluid in the catheter is cloudy.  You have other new symptoms.  You have questions or concerns. SEEK IMMEDIATE MEDICAL CARE IF:  Your abdominal pain does not go away or becomes severe.  You have a fever. MAKE SURE YOU:  Understand these instructions.  Will watch your condition.  Will get help right  away if you are not doing well or get worse. Document Released: 03/31/2011 Document Revised: 07/04/2011 Document Reviewed: 03/31/2011 [facility] Patient Information 2015 [place]. This information is not intended to replace advice given to you by your health care provider. Make sure you discuss any questions you have with your health care provider.

## 2014-02-21 NOTE — Progress Notes (Signed)
Paracentesis complete no signs of distress. 3000 ml amber colored ascites removed.  

## 2014-02-25 ENCOUNTER — Inpatient Hospital Stay (HOSPITAL_COMMUNITY)
Admission: EM | Admit: 2014-02-25 | Discharge: 2014-03-25 | DRG: 420 | Disposition: E | Payer: Medicaid Other | Attending: Family Medicine | Admitting: Family Medicine

## 2014-02-25 ENCOUNTER — Encounter (HOSPITAL_COMMUNITY): Payer: Self-pay

## 2014-02-25 ENCOUNTER — Emergency Department (HOSPITAL_COMMUNITY): Payer: Medicaid Other

## 2014-02-25 ENCOUNTER — Inpatient Hospital Stay (HOSPITAL_COMMUNITY): Payer: Medicaid Other

## 2014-02-25 ENCOUNTER — Encounter (HOSPITAL_COMMUNITY): Admission: EM | Disposition: E | Payer: Self-pay | Source: Home / Self Care | Attending: Internal Medicine

## 2014-02-25 ENCOUNTER — Inpatient Hospital Stay (HOSPITAL_COMMUNITY): Payer: Medicaid Other | Admitting: Anesthesiology

## 2014-02-25 DIAGNOSIS — E440 Moderate protein-calorie malnutrition: Secondary | ICD-10-CM | POA: Diagnosis present

## 2014-02-25 DIAGNOSIS — G4733 Obstructive sleep apnea (adult) (pediatric): Secondary | ICD-10-CM | POA: Diagnosis present

## 2014-02-25 DIAGNOSIS — Z79899 Other long term (current) drug therapy: Secondary | ICD-10-CM | POA: Diagnosis not present

## 2014-02-25 DIAGNOSIS — Z8673 Personal history of transient ischemic attack (TIA), and cerebral infarction without residual deficits: Secondary | ICD-10-CM

## 2014-02-25 DIAGNOSIS — K7031 Alcoholic cirrhosis of liver with ascites: Principal | ICD-10-CM

## 2014-02-25 DIAGNOSIS — I252 Old myocardial infarction: Secondary | ICD-10-CM

## 2014-02-25 DIAGNOSIS — R571 Hypovolemic shock: Secondary | ICD-10-CM | POA: Diagnosis present

## 2014-02-25 DIAGNOSIS — K7460 Unspecified cirrhosis of liver: Secondary | ICD-10-CM

## 2014-02-25 DIAGNOSIS — D62 Acute posthemorrhagic anemia: Secondary | ICD-10-CM | POA: Diagnosis present

## 2014-02-25 DIAGNOSIS — T45515A Adverse effect of anticoagulants, initial encounter: Secondary | ICD-10-CM | POA: Diagnosis present

## 2014-02-25 DIAGNOSIS — B1920 Unspecified viral hepatitis C without hepatic coma: Secondary | ICD-10-CM | POA: Diagnosis present

## 2014-02-25 DIAGNOSIS — B182 Chronic viral hepatitis C: Secondary | ICD-10-CM | POA: Diagnosis present

## 2014-02-25 DIAGNOSIS — J9621 Acute and chronic respiratory failure with hypoxia: Secondary | ICD-10-CM | POA: Diagnosis not present

## 2014-02-25 DIAGNOSIS — G40909 Epilepsy, unspecified, not intractable, without status epilepticus: Secondary | ICD-10-CM | POA: Diagnosis present

## 2014-02-25 DIAGNOSIS — Z86718 Personal history of other venous thrombosis and embolism: Secondary | ICD-10-CM | POA: Diagnosis not present

## 2014-02-25 DIAGNOSIS — N2581 Secondary hyperparathyroidism of renal origin: Secondary | ICD-10-CM | POA: Diagnosis present

## 2014-02-25 DIAGNOSIS — D696 Thrombocytopenia, unspecified: Secondary | ICD-10-CM | POA: Diagnosis present

## 2014-02-25 DIAGNOSIS — Z66 Do not resuscitate: Secondary | ICD-10-CM | POA: Diagnosis present

## 2014-02-25 DIAGNOSIS — E870 Hyperosmolality and hypernatremia: Secondary | ICD-10-CM | POA: Diagnosis present

## 2014-02-25 DIAGNOSIS — I129 Hypertensive chronic kidney disease with stage 1 through stage 4 chronic kidney disease, or unspecified chronic kidney disease: Secondary | ICD-10-CM | POA: Diagnosis present

## 2014-02-25 DIAGNOSIS — I480 Paroxysmal atrial fibrillation: Secondary | ICD-10-CM | POA: Diagnosis present

## 2014-02-25 DIAGNOSIS — F1010 Alcohol abuse, uncomplicated: Secondary | ICD-10-CM | POA: Diagnosis present

## 2014-02-25 DIAGNOSIS — J9600 Acute respiratory failure, unspecified whether with hypoxia or hypercapnia: Secondary | ICD-10-CM

## 2014-02-25 DIAGNOSIS — E876 Hypokalemia: Secondary | ICD-10-CM | POA: Diagnosis not present

## 2014-02-25 DIAGNOSIS — K766 Portal hypertension: Secondary | ICD-10-CM | POA: Diagnosis present

## 2014-02-25 DIAGNOSIS — J9690 Respiratory failure, unspecified, unspecified whether with hypoxia or hypercapnia: Secondary | ICD-10-CM

## 2014-02-25 DIAGNOSIS — R109 Unspecified abdominal pain: Secondary | ICD-10-CM | POA: Diagnosis present

## 2014-02-25 DIAGNOSIS — E875 Hyperkalemia: Secondary | ICD-10-CM | POA: Diagnosis present

## 2014-02-25 DIAGNOSIS — F17210 Nicotine dependence, cigarettes, uncomplicated: Secondary | ICD-10-CM | POA: Diagnosis present

## 2014-02-25 DIAGNOSIS — K219 Gastro-esophageal reflux disease without esophagitis: Secondary | ICD-10-CM | POA: Diagnosis present

## 2014-02-25 DIAGNOSIS — Z809 Family history of malignant neoplasm, unspecified: Secondary | ICD-10-CM

## 2014-02-25 DIAGNOSIS — I959 Hypotension, unspecified: Secondary | ICD-10-CM | POA: Insufficient documentation

## 2014-02-25 DIAGNOSIS — N179 Acute kidney failure, unspecified: Secondary | ICD-10-CM

## 2014-02-25 DIAGNOSIS — M1990 Unspecified osteoarthritis, unspecified site: Secondary | ICD-10-CM | POA: Diagnosis present

## 2014-02-25 DIAGNOSIS — G8929 Other chronic pain: Secondary | ICD-10-CM | POA: Diagnosis present

## 2014-02-25 DIAGNOSIS — R000 Tachycardia, unspecified: Secondary | ICD-10-CM | POA: Diagnosis present

## 2014-02-25 DIAGNOSIS — R791 Abnormal coagulation profile: Secondary | ICD-10-CM | POA: Diagnosis present

## 2014-02-25 DIAGNOSIS — N170 Acute kidney failure with tubular necrosis: Secondary | ICD-10-CM

## 2014-02-25 DIAGNOSIS — R578 Other shock: Secondary | ICD-10-CM | POA: Diagnosis present

## 2014-02-25 DIAGNOSIS — N189 Chronic kidney disease, unspecified: Secondary | ICD-10-CM | POA: Diagnosis present

## 2014-02-25 DIAGNOSIS — K922 Gastrointestinal hemorrhage, unspecified: Secondary | ICD-10-CM

## 2014-02-25 DIAGNOSIS — I81 Portal vein thrombosis: Secondary | ICD-10-CM | POA: Diagnosis present

## 2014-02-25 DIAGNOSIS — Z79891 Long term (current) use of opiate analgesic: Secondary | ICD-10-CM | POA: Diagnosis not present

## 2014-02-25 DIAGNOSIS — K429 Umbilical hernia without obstruction or gangrene: Secondary | ICD-10-CM | POA: Diagnosis present

## 2014-02-25 DIAGNOSIS — Z7901 Long term (current) use of anticoagulants: Secondary | ICD-10-CM

## 2014-02-25 DIAGNOSIS — F411 Generalized anxiety disorder: Secondary | ICD-10-CM | POA: Diagnosis present

## 2014-02-25 DIAGNOSIS — I8500 Esophageal varices without bleeding: Secondary | ICD-10-CM | POA: Diagnosis present

## 2014-02-25 DIAGNOSIS — R188 Other ascites: Secondary | ICD-10-CM

## 2014-02-25 DIAGNOSIS — Z01818 Encounter for other preprocedural examination: Secondary | ICD-10-CM

## 2014-02-25 DIAGNOSIS — D72829 Elevated white blood cell count, unspecified: Secondary | ICD-10-CM | POA: Diagnosis not present

## 2014-02-25 DIAGNOSIS — I8501 Esophageal varices with bleeding: Secondary | ICD-10-CM | POA: Diagnosis present

## 2014-02-25 HISTORY — PX: ESOPHAGOGASTRODUODENOSCOPY: SHX5428

## 2014-02-25 LAB — BLOOD GAS, ARTERIAL
ACID-BASE DEFICIT: 7.9 mmol/L — AB (ref 0.0–2.0)
BICARBONATE: 16.6 meq/L — AB (ref 20.0–24.0)
DRAWN BY: 27407
FIO2: 0.4 %
LHR: 14 {breaths}/min
MECHVT: 550 mL
O2 SAT: 99.1 %
PEEP: 5 cmH2O
PO2 ART: 157 mmHg — AB (ref 80.0–100.0)
Patient temperature: 37
TCO2: 16.2 mmol/L (ref 0–100)
pCO2 arterial: 31.1 mmHg — ABNORMAL LOW (ref 35.0–45.0)
pH, Arterial: 7.348 — ABNORMAL LOW (ref 7.350–7.450)

## 2014-02-25 LAB — MRSA PCR SCREENING: MRSA BY PCR: NEGATIVE

## 2014-02-25 LAB — CBC WITH DIFFERENTIAL/PLATELET
Basophils Absolute: 0 10*3/uL (ref 0.0–0.1)
Basophils Relative: 0 % (ref 0–1)
Eosinophils Absolute: 0 10*3/uL (ref 0.0–0.7)
Eosinophils Relative: 0 % (ref 0–5)
HEMATOCRIT: 21.3 % — AB (ref 39.0–52.0)
HEMOGLOBIN: 7 g/dL — AB (ref 13.0–17.0)
LYMPHS PCT: 11 % — AB (ref 12–46)
Lymphs Abs: 1.3 10*3/uL (ref 0.7–4.0)
MCH: 31.1 pg (ref 26.0–34.0)
MCHC: 32.9 g/dL (ref 30.0–36.0)
MCV: 94.7 fL (ref 78.0–100.0)
MONO ABS: 0.9 10*3/uL (ref 0.1–1.0)
Monocytes Relative: 8 % (ref 3–12)
NEUTROS ABS: 9.8 10*3/uL — AB (ref 1.7–7.7)
NEUTROS PCT: 81 % — AB (ref 43–77)
Platelets: 94 10*3/uL — ABNORMAL LOW (ref 150–400)
RBC: 2.25 MIL/uL — ABNORMAL LOW (ref 4.22–5.81)
RDW: 15.7 % — AB (ref 11.5–15.5)
Smear Review: DECREASED
WBC: 12.1 10*3/uL — AB (ref 4.0–10.5)

## 2014-02-25 LAB — COMPREHENSIVE METABOLIC PANEL
ALT: 71 U/L — AB (ref 0–53)
AST: 206 U/L — AB (ref 0–37)
Albumin: 1.9 g/dL — ABNORMAL LOW (ref 3.5–5.2)
Alkaline Phosphatase: 82 U/L (ref 39–117)
BILIRUBIN TOTAL: 4.3 mg/dL — AB (ref 0.3–1.2)
BUN: 46 mg/dL — AB (ref 6–23)
CHLORIDE: 103 meq/L (ref 96–112)
CO2: 16 mEq/L — ABNORMAL LOW (ref 19–32)
Calcium: 8 mg/dL — ABNORMAL LOW (ref 8.4–10.5)
Creatinine, Ser: 1.87 mg/dL — ABNORMAL HIGH (ref 0.50–1.35)
GFR calc Af Amer: 44 mL/min — ABNORMAL LOW (ref >90)
GFR calc non Af Amer: 38 mL/min — ABNORMAL LOW (ref >90)
Glucose, Bld: 90 mg/dL (ref 70–99)
Potassium: 5.6 mEq/L — ABNORMAL HIGH (ref 3.7–5.3)
Sodium: 142 mEq/L (ref 137–147)
Total Protein: 4.9 g/dL — ABNORMAL LOW (ref 6.0–8.3)

## 2014-02-25 LAB — AMMONIA: Ammonia: 45 umol/L (ref 11–60)

## 2014-02-25 LAB — LIPASE, BLOOD: Lipase: 44 U/L (ref 11–59)

## 2014-02-25 LAB — HEMOGLOBIN AND HEMATOCRIT, BLOOD
HEMATOCRIT: 17.9 % — AB (ref 39.0–52.0)
HEMATOCRIT: 21.5 % — AB (ref 39.0–52.0)
HEMOGLOBIN: 6 g/dL — AB (ref 13.0–17.0)
HEMOGLOBIN: 7.4 g/dL — AB (ref 13.0–17.0)

## 2014-02-25 LAB — PREPARE RBC (CROSSMATCH)

## 2014-02-25 LAB — POTASSIUM: Potassium: 5 mEq/L (ref 3.7–5.3)

## 2014-02-25 LAB — ABO/RH: ABO/RH(D): B POS

## 2014-02-25 LAB — PROTIME-INR
INR: 2.68 — AB (ref 0.00–1.49)
PROTHROMBIN TIME: 28.7 s — AB (ref 11.6–15.2)

## 2014-02-25 LAB — ETHANOL: Alcohol, Ethyl (B): <11 mg/dL (ref 0–11)

## 2014-02-25 LAB — POC OCCULT BLOOD, ED: Fecal Occult Bld: POSITIVE — AB

## 2014-02-25 SURGERY — EGD (ESOPHAGOGASTRODUODENOSCOPY)
Anesthesia: Moderate Sedation

## 2014-02-25 MED ORDER — LEVOFLOXACIN IN D5W 750 MG/150ML IV SOLN
750.0000 mg | INTRAVENOUS | Status: AC
  Administered 2014-02-25 – 2014-03-05 (×5): 750 mg via INTRAVENOUS
  Filled 2014-02-25 (×5): qty 150

## 2014-02-25 MED ORDER — CETYLPYRIDINIUM CHLORIDE 0.05 % MT LIQD
7.0000 mL | Freq: Four times a day (QID) | OROMUCOSAL | Status: DC
  Administered 2014-02-26 – 2014-03-08 (×41): 7 mL via OROMUCOSAL

## 2014-02-25 MED ORDER — LACTULOSE 10 GM/15ML PO SOLN
20.0000 g | Freq: Two times a day (BID) | ORAL | Status: DC
Start: 2014-02-25 — End: 2014-03-06
  Filled 2014-02-25: qty 30

## 2014-02-25 MED ORDER — MEPERIDINE HCL 100 MG/ML IJ SOLN
INTRAMUSCULAR | Status: AC
  Filled 2014-02-25: qty 2

## 2014-02-25 MED ORDER — PROPOFOL 10 MG/ML IV EMUL
INTRAVENOUS | Status: AC
  Administered 2014-02-25: 1000 mg via INTRAVENOUS
  Filled 2014-02-25: qty 100

## 2014-02-25 MED ORDER — ONDANSETRON HCL 4 MG/2ML IJ SOLN: 4.0000 mg | Freq: Once | INTRAMUSCULAR | Status: DC

## 2014-02-25 MED ORDER — EPINEPHRINE HCL 0.1 MG/ML IJ SOSY
PREFILLED_SYRINGE | INTRAMUSCULAR | Status: AC
  Filled 2014-02-25: qty 10

## 2014-02-25 MED ORDER — OCTREOTIDE LOAD VIA INFUSION
25.0000 ug | Freq: Once | INTRAVENOUS | Status: AC
  Administered 2014-02-25: 25 ug via INTRAVENOUS
  Filled 2014-02-25: qty 13

## 2014-02-25 MED ORDER — SUCCINYLCHOLINE CHLORIDE 20 MG/ML IJ SOLN
INTRAMUSCULAR | Status: AC
  Filled 2014-02-25: qty 1

## 2014-02-25 MED ORDER — MIDAZOLAM HCL 5 MG/5ML IJ SOLN
INTRAMUSCULAR | Status: AC
  Filled 2014-02-25: qty 10

## 2014-02-25 MED ORDER — LIDOCAINE HCL (CARDIAC) 20 MG/ML IV SOLN
INTRAVENOUS | Status: AC
  Filled 2014-02-25: qty 5

## 2014-02-25 MED ORDER — VITAMIN K1 10 MG/ML IJ SOLN
10.0000 mg | INTRAMUSCULAR | Status: AC
  Administered 2014-02-25: 10 mg via INTRAVENOUS
  Filled 2014-02-25: qty 1

## 2014-02-25 MED ORDER — ONDANSETRON HCL 4 MG/2ML IJ SOLN
4.0000 mg | Freq: Once | INTRAMUSCULAR | Status: AC
  Administered 2014-02-25: 4 mg via INTRAVENOUS

## 2014-02-25 MED ORDER — SODIUM CHLORIDE 0.9 % IV SOLN
INTRAVENOUS | Status: DC
  Administered 2014-02-26: via INTRAVENOUS

## 2014-02-25 MED ORDER — SODIUM CHLORIDE 0.9 % IV SOLN
80.0000 mg | Freq: Two times a day (BID) | INTRAVENOUS | Status: DC
  Administered 2014-02-25 – 2014-02-28 (×7): 80 mg via INTRAVENOUS
  Filled 2014-02-25 (×7): qty 80

## 2014-02-25 MED ORDER — STERILE WATER FOR IRRIGATION IR SOLN
Status: DC | PRN
  Administered 2014-02-25: 17:00:00

## 2014-02-25 MED ORDER — SODIUM CHLORIDE 0.9 % IV BOLUS (SEPSIS)
1000.0000 mL | Freq: Once | INTRAVENOUS | Status: AC
  Administered 2014-02-25: 1000 mL via INTRAVENOUS

## 2014-02-25 MED ORDER — VITAMIN K1 10 MG/ML IJ SOLN
10.0000 mg | INTRAVENOUS | Status: AC
  Administered 2014-02-26 – 2014-02-28 (×3): 10 mg via INTRAVENOUS
  Filled 2014-02-25 (×3): qty 1

## 2014-02-25 MED ORDER — ROCURONIUM BROMIDE 100 MG/10ML IV SOLN
INTRAVENOUS | Status: DC | PRN
  Administered 2014-02-25: 30 mg via INTRAVENOUS

## 2014-02-25 MED ORDER — SODIUM CHLORIDE 0.9 % IV SOLN
Freq: Once | INTRAVENOUS | Status: AC
  Administered 2014-02-25: 16:00:00 via INTRAVENOUS

## 2014-02-25 MED ORDER — SODIUM CHLORIDE 0.9 % IV BOLUS (SEPSIS)
500.0000 mL | INTRAVENOUS | Status: DC | PRN
  Administered 2014-02-25: 500 mL via INTRAVENOUS
  Administered 2014-02-25: 200 mL via INTRAVENOUS
  Filled 2014-02-25 (×2): qty 500

## 2014-02-25 MED ORDER — SODIUM CHLORIDE 0.9 % IV SOLN
Freq: Once | INTRAVENOUS | Status: AC
  Administered 2014-02-25: 1000 mL via INTRAVENOUS

## 2014-02-25 MED ORDER — ROCURONIUM BROMIDE 50 MG/5ML IV SOLN
INTRAVENOUS | Status: AC
  Administered 2014-02-25: 30 mg
  Filled 2014-02-25: qty 2

## 2014-02-25 MED ORDER — NOREPINEPHRINE BITARTRATE 1 MG/ML IV SOLN
0.0000 ug/min | INTRAVENOUS | Status: DC
  Administered 2014-02-25: 2 ug/min via INTRAVENOUS
  Administered 2014-02-26: 10 ug/min via INTRAVENOUS
  Administered 2014-02-26: 15 ug/min via INTRAVENOUS
  Filled 2014-02-25 (×2): qty 4

## 2014-02-25 MED ORDER — SODIUM CHLORIDE 0.9 % IV SOLN
INTRAVENOUS | Status: AC
  Administered 2014-02-25: 15:00:00 via INTRAVENOUS

## 2014-02-25 MED ORDER — PANTOPRAZOLE SODIUM 40 MG IV SOLR
40.0000 mg | Freq: Once | INTRAVENOUS | Status: AC
  Administered 2014-02-25: 40 mg via INTRAVENOUS
  Filled 2014-02-25: qty 40

## 2014-02-25 MED ORDER — SODIUM CHLORIDE 0.9 % IV SOLN: Freq: Once | INTRAVENOUS | Status: AC

## 2014-02-25 MED ORDER — CHLORHEXIDINE GLUCONATE 0.12 % MT SOLN
15.0000 mL | Freq: Two times a day (BID) | OROMUCOSAL | Status: DC
  Administered 2014-02-25 – 2014-03-08 (×23): 15 mL via OROMUCOSAL
  Filled 2014-02-25 (×20): qty 15

## 2014-02-25 MED ORDER — METOCLOPRAMIDE HCL 5 MG/ML IJ SOLN
20.0000 mg | Freq: Once | INTRAVENOUS | Status: AC
  Administered 2014-02-25: 20 mg via INTRAVENOUS
  Filled 2014-02-25: qty 4

## 2014-02-25 MED ORDER — FENTANYL CITRATE 0.05 MG/ML IJ SOLN
50.0000 ug | INTRAMUSCULAR | Status: DC | PRN
  Filled 2014-02-25: qty 2

## 2014-02-25 MED ORDER — PROPOFOL 10 MG/ML IV EMUL
INTRAVENOUS | Status: DC
  Administered 2014-02-25 – 2014-02-26 (×5): 1000 mg via INTRAVENOUS
  Filled 2014-02-25 (×2): qty 100
  Filled 2014-02-25 (×2): qty 200
  Filled 2014-02-25: qty 100

## 2014-02-25 MED ORDER — SODIUM CHLORIDE 0.9 % IJ SOLN
3.0000 mL | Freq: Two times a day (BID) | INTRAMUSCULAR | Status: DC
  Administered 2014-02-25 – 2014-03-02 (×9): 3 mL via INTRAVENOUS
  Administered 2014-03-03 – 2014-03-04 (×3): 10 mL via INTRAVENOUS
  Administered 2014-03-05 – 2014-03-09 (×7): 3 mL via INTRAVENOUS

## 2014-02-25 MED ORDER — ETOMIDATE 2 MG/ML IV SOLN
INTRAVENOUS | Status: DC | PRN
  Administered 2014-02-25: 12 mg via INTRAVENOUS
  Administered 2014-02-25: 6 mg via INTRAVENOUS

## 2014-02-25 MED ORDER — MIDAZOLAM HCL 5 MG/5ML IJ SOLN
INTRAMUSCULAR | Status: DC | PRN
  Administered 2014-02-25 (×2): 1 mg via INTRAVENOUS

## 2014-02-25 MED ORDER — MIDAZOLAM HCL 2 MG/2ML IJ SOLN: 1.0000 mg | Freq: Once | INTRAMUSCULAR | Status: AC

## 2014-02-25 MED ORDER — FOLIC ACID 1 MG PO TABS
1.0000 mg | ORAL_TABLET | Freq: Every day | ORAL | Status: DC
  Administered 2014-03-07 – 2014-03-09 (×3): 1 mg via ORAL
  Filled 2014-02-25 (×3): qty 1

## 2014-02-25 MED ORDER — FENTANYL CITRATE 0.05 MG/ML IJ SOLN
INTRAMUSCULAR | Status: AC
  Administered 2014-02-25: 50 ug via INTRAVENOUS
  Filled 2014-02-25: qty 2

## 2014-02-25 MED ORDER — ONDANSETRON HCL 4 MG/2ML IJ SOLN
4.0000 mg | Freq: Once | INTRAMUSCULAR | Status: DC
  Filled 2014-02-25: qty 2

## 2014-02-25 MED ORDER — LORAZEPAM 2 MG/ML IJ SOLN
1.0000 mg | Freq: Once | INTRAMUSCULAR | Status: AC
  Administered 2014-02-25: 1 mg via INTRAVENOUS
  Filled 2014-02-25: qty 1

## 2014-02-25 MED ORDER — LORAZEPAM 2 MG/ML IJ SOLN
0.5000 mg | INTRAMUSCULAR | Status: DC | PRN
  Administered 2014-02-26 – 2014-03-08 (×11): 0.5 mg via INTRAVENOUS
  Filled 2014-02-25 (×13): qty 1

## 2014-02-25 MED ORDER — ONDANSETRON HCL 4 MG/2ML IJ SOLN
INTRAMUSCULAR | Status: AC
Start: 2014-02-25 — End: 2014-02-25
  Filled 2014-02-25: qty 2

## 2014-02-25 MED ORDER — SODIUM CHLORIDE 0.9 % IV BOLUS (SEPSIS)
1000.0000 mL | Freq: Once | INTRAVENOUS | Status: AC
Start: 2014-02-25 — End: 2014-02-25
  Administered 2014-02-25: 1000 mL via INTRAVENOUS

## 2014-02-25 MED ORDER — ETOMIDATE 2 MG/ML IV SOLN
INTRAVENOUS | Status: AC
  Administered 2014-02-25: 12 mg
  Filled 2014-02-25: qty 20

## 2014-02-25 MED ORDER — NOREPINEPHRINE BITARTRATE 1 MG/ML IV SOLN
INTRAVENOUS | Status: AC
  Filled 2014-02-25: qty 4

## 2014-02-25 MED ORDER — CETYLPYRIDINIUM CHLORIDE 0.05 % MT LIQD
7.0000 mL | Freq: Two times a day (BID) | OROMUCOSAL | Status: DC
  Administered 2014-02-25: 7 mL via OROMUCOSAL

## 2014-02-25 MED ORDER — SODIUM CHLORIDE 0.9 % IV SOLN
50.0000 ug/h | INTRAVENOUS | Status: AC
  Administered 2014-02-25 – 2014-02-28 (×5): 50 ug/h via INTRAVENOUS
  Filled 2014-02-25 (×8): qty 1

## 2014-02-25 MED ORDER — FENTANYL CITRATE 0.05 MG/ML IJ SOLN
50.0000 ug | INTRAMUSCULAR | Status: DC | PRN
  Administered 2014-02-25: 50 ug via INTRAVENOUS

## 2014-02-25 MED ORDER — MORPHINE SULFATE 2 MG/ML IJ SOLN
1.0000 mg | INTRAMUSCULAR | Status: DC | PRN
  Administered 2014-03-07 – 2014-03-08 (×2): 1 mg via INTRAVENOUS
  Filled 2014-02-25 (×2): qty 1

## 2014-02-25 NOTE — ED Notes (Signed)
Pt sleeping. 

## 2014-02-25 NOTE — Anesthesia Procedure Notes (Signed)
Procedure Name: Intubation Date/Time: 03/16/2014 3:40 PM Performed by: Franco NonesYATES, Enisa Runyan S Pre-anesthesia Checklist: Patient identified, Emergency Drugs available, Suction available, Patient being monitored and Timeout performed Patient Re-evaluated:Patient Re-evaluated prior to inductionOxygen Delivery Method: Ambu bag Preoxygenation: Pre-oxygenation with 100% oxygen Intubation Type: IV induction, Rapid sequence and Cricoid Pressure applied Ventilation: Mask ventilation without difficulty Laryngoscope Size: Mac and 3 Grade View: Grade II Tube type: Subglottic suction tube Tube size: 7.5 mm Number of attempts: 1 Placement Confirmation: ETT inserted through vocal cords under direct vision,  CO2 detector and breath sounds checked- equal and bilateral Secured at: 23 cm Tube secured with: Tape Dental Injury: Teeth and Oropharynx as per pre-operative assessment

## 2014-02-25 NOTE — ED Notes (Signed)
Pt had BM, mostly watery. No gross blood noted.

## 2014-02-25 NOTE — Interval H&P Note (Signed)
History and Physical Interval Note:  02/27/2014 5:00 PM  Darren Abbott  has presented today for surgery, with the diagnosis of melena, hematemesis  The various methods of treatment have been discussed with the patient and family. After consideration of risks, benefits and other options for treatment, the patient has consented to  Procedure(s): ESOPHAGOGASTRODUODENOSCOPY (EGD) (N/A) as a surgical intervention .  The patient's history has been reviewed, patient examined, no change in status, stable for surgery.  I have reviewed the patient's chart and labs.  Questions were answered to the patient's satisfaction.     Eaton CorporationSandi Fields

## 2014-02-25 NOTE — Progress Notes (Signed)
Dr.Fields aware of consult.

## 2014-02-25 NOTE — ED Notes (Signed)
Pt alert and responding appropriately. HOB lowered.

## 2014-02-25 NOTE — ED Notes (Signed)
Hospitalist at bedside 

## 2014-02-25 NOTE — H&P (Signed)
[facility] History and Physical  Darren DonHerbert E Abbott ZOX:[id number] DOB: 08/08/1955 DOA: 03/18/2014  Referring physician:  PCP: Augustine RadarOBERSON, KRISTINA, MD  Specialists:   Chief Complaint: vomiting, abdominal pain, GIB  HPI: Darren Abbott is a 58 y.o. male with PMH of PAF, CVA, Alcoholic cirrhosis, Hep C, Portal vein thrombus, H/o GIB presented with hematemesis, hematohezia for few days, associated with chronic abdominal pain and found to have hypotension with Hg 7.0; BP responding to IVF, ordered PRBC transfusion;  -patient denies chest pain, no SOB, reports chronic chills, but no fever, no focal neuro symptoms;   Review of Systems: The patient denies anorexia, fever, weight loss,, vision loss, decreased hearing, hoarseness, chest pain, syncope, dyspnea on exertion, peripheral edema, balance deficits, hemoptysis, abdominal pain, melena, hematochezia, severe indigestion/heartburn, hematuria, incontinence, genital sores, muscle weakness, suspicious skin lesions, transient blindness, difficulty walking, depression, unusual weight change, abnormal bleeding, enlarged lymph nodes, angioedema, and breast masses.    Past Medical History  Diagnosis Date  . Malnutrition of moderate degree   . Altered mental status   . Unspecified essential hypertension   . Anxiety state, unspecified   . Transient ischemic attack (TIA), and cerebral infarction without residual deficits(V12.54)   . Body mass index between 19-24, adult   . Atrial fibrillation     episode at [place].  [place]. Heart murmur   . Obstructive sleep apnea (adult) (pediatric)     to start back after surgery- not used for 2-103months  . GERD (gastroesophageal reflux disease)   . Unspecified epilepsy without mention of intractable epilepsy 13    only once  . Hand laceration 80    end of all fingers on rt hand  . Old myocardial infarction   . Asthma     "outgrew it"  . Borderline diabetes   . Unspecified viral hepatitis C with hepatic coma      treatment naive  . Acute alcoholic hepatitis     possible hepatic encephalopathy May 10, 2011  . Daily headache   . Migraine     "daily" (08/30/2013)  . Stroke ~ 2000    "mild"  . Arthritis     "I'm ate up w/it" (08/30/2013)  . Chronic lower back pain   . On home oxygen therapy     "2L prn" (08/30/2013)  . Chronic kidney disease     "one isn't functioning like it should" (08/30/2013)  . Cirrhosis   . Esophageal varices   . Thrombus     chronic,junction of splenic portal vein  . Pulmonary nodules     chronic an new 2015  . Cataract    Past Surgical History  Procedure Laterality Date  . Clavicle surgery Left ~ 2008    "crushed it"  . Hand surgery Left     lacerations of thumb. reattached  . Nasal septum surgery  08/30/2013  . Rhinoplasty  08/30/2013  . Hand surgery Right 1980's    "all 4 fingers cut off and reattached"  . Foot fracture surgery Right   . Septoplasty N/A 08/30/2013    Procedure: SEPTOPLASTY;  Surgeon: Serena ColonelJefry Rosen, MD;  [facility];  Service: ENT;  Laterality: N/A;  . Rhinoplasty N/A 08/30/2013    Procedure: RHINOPLASTY;  Surgeon: Serena ColonelJefry Rosen, MD;  [facility];  Service: ENT;  Laterality: N/A;  . Esophagogastroduodenoscopy  Dec 2014    Dr. Samuella CotaPandya: Grade 2-3 esophageal varices s/p banding X 5. Erosive gastritis. Negative H.pylori  . Paracentesis      multiple  Social History:  reports that he has been smoking Cigarettes.  He has a 11 pack-year smoking history. He has never used smokeless tobacco. He reports that he drinks alcohol. He reports that he does not use illicit drugs. Home;  where does patient live--home, ALF, SNF? and with whom if at home? Yes;  Can patient participate in ADLs?  No Known Allergies  Family History  Problem Relation Age of Onset  . Colon cancer Neg Hx   . Cancer Mother     (be sure to complete)  Prior to Admission medications   Medication Sig Start Date End Date Taking? Authorizing Provider  folic acid (FOLVITE) 1 MG tablet  Take 1 tablet (1 mg total) by mouth daily. 01/10/14  Yes Lesle Chris Black, NP  furosemide (LASIX) 20 MG tablet Take 1 tablet (20 mg total) by mouth daily. 01/23/14  Yes Tiffany Kocher, PA-C  Lactulose 20 GM/30ML SOLN Take 30 mLs (20 g total) by mouth 2 (two) times daily. 01/10/14  Yes Lesle Chris Black, NP  metoprolol (LOPRESSOR) 50 MG tablet Take 0.5 tablets (25 mg total) by mouth daily. 01/10/14  Yes Lesle Chris Black, NP  Multiple Vitamins-Minerals (CENTRUM SILVER PO) Take 1 tablet by mouth daily.   Yes Historical Provider, MD  ondansetron (ZOFRAN) 4 MG tablet Take 4 mg by mouth every 8 (eight) hours as needed for nausea or vomiting.   Yes Historical Provider, MD  oxazepam (SERAX) 10 MG capsule Take 10 mg by mouth 2 (two) times daily.   Yes Historical Provider, MD  oxyCODONE (OXY IR/ROXICODONE) 5 MG immediate release tablet Take 1 tablet (5 mg total) by mouth every 4 (four) hours as needed for severe pain. 02/05/14  Yes Alla German, MD  pantoprazole (PROTONIX) 40 MG tablet Take 1 tablet (40 mg total) by mouth 2 (two) times daily before a meal. 01/10/14  Yes Gwenyth Bender, NP  Rivaroxaban (XARELTO) 15 MG TABS tablet Take 1 tablet (15 mg total) by mouth daily with breakfast. 01/10/14  Yes Gwenyth Bender, NP  spironolactone (ALDACTONE) 50 MG tablet Take 1 tablet (50 mg total) by mouth daily. 01/23/14  Yes Tiffany Kocher, PA-C   Physical Exam: Filed Vitals:   03-01-14 1311  BP: 90/47  Pulse:   Resp: 18     General:  alert  Eyes: eom-i  ENT: no oral ulcers   Neck: supple   Cardiovascular: s1,s2 tachy  Respiratory: few crackles in LL  Abdomen: soft, nt,nd   Skin: no rash   Musculoskeletal: no LE edema  Psychiatric: no hallucinations   Neurologic: CN 2-12 intact; motor 5/5 BL  Labs on Admission:  Basic Metabolic Panel:  Recent Labs Lab Mar 01, 2014 1040  NA 142  K 5.6*  CL 103  CO2 16*  GLUCOSE 90  BUN 46*  CREATININE 1.87*  CALCIUM 8.0*   Liver Function Tests:  Recent Labs Lab  2014-03-01 1040  AST 206*  ALT 71*  ALKPHOS 82  BILITOT 4.3*  PROT 4.9*  ALBUMIN 1.9*    Recent Labs Lab 03-01-2014 1040  LIPASE 44    Recent Labs Lab 03/01/14 1040  AMMONIA 45   CBC:  Recent Labs Lab 03/01/14 1040  WBC 12.1*  NEUTROABS 9.8*  HGB 7.0*  HCT 21.3*  MCV 94.7  PLT 94*   Cardiac Enzymes: No results for input(s): CKTOTAL, CKMB, CKMBINDEX, TROPONINI in the last 168 hours.  BNP (last 3 results) No results for input(s): PROBNP in the last 8760 hours. CBG:  No results for input(s): GLUCAP in the last 168 hours.  Radiological Exams on Admission: Dg Abd Acute W/chest  03/20/2014   CLINICAL DATA:  58 year old male with abdominal pain in all quadrants, gastrointestinal bleeding. Blood in stool for 4 days with nausea and vomiting. Initial encounter.  EXAM: ACUTE ABDOMEN SERIES (ABDOMEN 2 VIEW & CHEST 1 VIEW)  COMPARISON:  Chest CT 01/08/2014 and earlier. CT Abdomen and Pelvis 01/06/2014.  FINDINGS: AP upright view of the chest. Stable lung volumes. The lungs are clear. Stable left clavicle ORIF. Normal cardiac size and mediastinal contours. No pneumothorax or pneumoperitoneum identified.  Gasless abdomen. Wallace CullensGray hazy opacity throughout the abdomen suggestive of ascites which was present on 01/06/2014. No acute osseous abnormality identified.  IMPRESSION: 1. No free air. Gasless abdomen with gray hazy opacity throughout suggesting ascites. See also CT Abdomen and Pelvis 01/06/2014 2.  No acute cardiopulmonary abnormality.   Electronically Signed   By: Augusto GambleLee  Hall M.D.   On: 03/17/2014 12:24    EKG: Independently reviewed.   Assessment/Plan Principal Problem:   Upper GI bleed Active Problems:   Abdominal pain   58 y.o. male with PMH of PAF, CVA, Alcoholic cirrhosis, Hep C, Portal vein thrombus, H/o GIB presented with hematemesis, hematohezia for few days, associated with chronic abdominal pain and found to have hypotension with Hg 7.0;   1. Hypotension due to blood  loss anemia  -cont IVF resuscitation as needed; TF PRBCs; close monitor   2. Acute on chronic GIB, blood loss anemia  -Due to liver cirrhosis; Pt was also on anticoagulation but took last dose of xarelto (4 days ago) -TF 2 units PRBCs; monitor Hg; TF prn; obtain stat INR; TF FFPs prn;  -start octreotide; PPI; cont to hold xarelto; consulted GI 3. Liver cirrhosis, coagulopathy, thrombocytopenia, portal HTN, splenomegaly, Hep C, hypoalbuminemia;   -probable developing Hepatorenal syndrome  -prognosis is poor; cont close monitor, paracentesis prn;  atx for probable SBP;  -consulted GI; called d/w Dr. Darrick PennaFields  4. AKI likely prerenal, but probable developing hepatorenal syndrome; Hyper K (AKI+spironalactone) -cont IVF resuscitation, repeat potassium lab; if elevated will give IV insulin, d 50%,  Ca, IVF, kayaxalate; monitor renal function, BMP 5. Portal vein thrombus, unfortunately cant anticoagulate with active bleeding, hypotension;   Prognosis is guarded; d/w patient, update his daughter   GI;  if consultant consulted, please document name and whether formally or informally consulted  Code Status: fll (must indicate code status--if unknown or must be presumed, indicate so) Family Communication: d/w patient, called updated his daughter  Vincent PeyerBrown,Keisha Daughter [phone]    (indicate person spoken with, if applicable, with phone number if by telephone) Disposition Plan: pend clinical improvement  (indicate anticipated LOS)  Time spent: >35 minutes   Esperanza SheetsBURIEV, Belky Mundo N [facility] Pager [phone]  If 7PM-7AM, please contact night-coverage [email or web address] Password [email or web address] 02/27/2014, 1:49 PM

## 2014-02-25 NOTE — Consult Note (Addendum)
Referring Provider: No ref. provider found Primary Care Physician:  Augustine RadarOBERSON, KRISTINA, MD Primary Gastroenterologist:  Jonette EvaSandi Seva Chancy  Reason for Consultation:  MELENA, HEMATEMEISIS   Impression: ADMITTED WITH UGI BLEED, HYPOTENSION AND HYPOXIA. LAST EPISODE OF HEMATEMESIS THIS AM. GI BLEED MOST LIKELY DUE TO ESOPHAGEAL VARICEAL BLEED.  Plan: 1. INTUBATE PT ASAP 2. CENTRAL LINE ASAP 3. PRESSORS ASAP.  4. TRANSFUSE ASAP 5. REGLAN 20 MG IV x1 NOW.  6. EGD/? VARICEAL BANDING ASAP.  7. CONTINUE OCTREOTIDE DRIP, & PPI.  1546: CALLED PT'S DAUGHTER. LVM RE: EGD. 1531: INR 2.68. ORDERED VIT K 10 MG IV AND 2u FFP. KEEP 4U pRBCS AVAILABLE AT ALL TIMES. CHILD PUGH C, MELD SCORE 29.  HPI:  PT LAST ADMITTED SEP 2015. HAS ABSTAINED FROM ETOH SINCE AT LEAST SEP 2015. PT KNOWN CHILD PUGH B WITH INCREASING LIVER ENZYMES. IN HIS USUAL STATE OF HEALTH UNTIL SAT WHEN HE DEVELOPED BLACK TARRY STOOLS FOLLOWED BY HEMATEMESIS(LARGE AMOUNT). HAS HAD DAILY HEMATEMESIS AND MELENA. FRIENDS BROUGHT HIM TO THE ED. PT UNABLE TO TOLERATE PO. EATS AND THEN HE VOMITS. HIS XARELTO HAS BEEN ON HOLD. PT HAS ALSO HAD A COUGH. SUBJECTIVE CHILLS. C/O R SIDED ABDOMINAL PAIN.  PT DENIES CHILLS, diarrhea, CHEST PAIN, SHORTNESS OF BREATH,  problems swallowing, OR heartburn or indigestion.  Past Medical History  Diagnosis Date  . Malnutrition of moderate degree   . Altered mental status   . Unspecified essential hypertension   . Anxiety state, unspecified   . Transient ischemic attack (TIA), and cerebral infarction without residual deficits(V12.54)   . Body mass index between 19-24, adult   . Atrial fibrillation     episode [place].  Marland Kitchen. Heart murmur   . Obstructive sleep apnea (adult) (pediatric)     to start back after surgery- not used for 2-743months  . GERD (gastroesophageal reflux disease)   . Unspecified epilepsy without mention of intractable epilepsy 13    only once  . Hand laceration 80    end of all  fingers on rt hand  . Old myocardial infarction   . Asthma     "outgrew it"  . Borderline diabetes   . 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 Acute alcoholic hepatitis     possible hepatic encephalopathy May 10, 2011  . Daily headache   . Migraine     "daily" (08/30/2013)  . Stroke ~ 2000    "mild"  . Arthritis     "I'm ate up w/it" (08/30/2013)  . Chronic lower back pain   . On home oxygen therapy     "2L prn" (08/30/2013)  . Chronic kidney disease     "one isn't functioning like it should" (08/30/2013)  . Cirrhosis   . Esophageal varices   . Thrombus     chronic,junction of splenic portal vein  . Pulmonary nodules     chronic an new 2015  . Cataract    Past Surgical History  Procedure Laterality Date  . Clavicle surgery Left ~ 2008    "crushed it"  . Hand surgery Left     lacerations of thumb. reattached  . Nasal septum surgery  08/30/2013  . Rhinoplasty  08/30/2013  . Hand surgery Right 1980's    "all 4 fingers cut off and reattached"  . Foot fracture surgery Right   . Septoplasty N/A 08/30/2013    Procedure: SEPTOPLASTY;  Surgeon: Serena ColonelJefry Rosen, MD;  [facility];  Service: ENT;  Laterality: N/A;  . Rhinoplasty N/A 08/30/2013    Procedure: RHINOPLASTY;  Surgeon: Serena ColonelJefry Rosen, MD;  [facility];  Service: ENT;  Laterality: N/A;  . Esophagogastroduodenoscopy  Dec 2014    Dr. Samuella CotaPandya: Grade 2-3 esophageal varices s/p banding X 5. Erosive gastritis. Negative H.pylori  . Paracentesis      multiple    Prior to Admission medications   Medication Sig Start Date End Date Taking? Authorizing Provider  folic acid (FOLVITE) 1 MG tablet Take 1 tablet (1 mg total) by mouth daily. 01/10/14  Yes Lesle ChrisKaren M Black, NP  furosemide (LASIX) 20 MG tablet Take 1 tablet (20 mg total) by mouth daily. 01/23/14  Yes Tiffany KocherLeslie S Lewis, PA-C  Lactulose 20 GM/30ML SOLN Take 30 mLs (20 g total) by mouth 2 (two) times daily. 01/10/14  Yes Lesle ChrisKaren M Black, NP  metoprolol  (LOPRESSOR) 50 MG tablet Take 0.5 tablets (25 mg total) by mouth daily. 01/10/14  Yes Lesle ChrisKaren M Black, NP  Multiple Vitamins-Minerals (CENTRUM SILVER PO) Take 1 tablet by mouth daily.   Yes Historical Provider, MD  ondansetron (ZOFRAN) 4 MG tablet Take 4 mg by mouth every 8 (eight) hours as needed for nausea or vomiting.   Yes Historical Provider, MD  oxazepam (SERAX) 10 MG capsule Take 10 mg by mouth 2 (two) times daily.   Yes Historical Provider, MD  oxyCODONE (OXY IR/ROXICODONE) 5 MG immediate release tablet Take 1 tablet (5 mg total) by mouth every 4 (four) hours as needed for severe pain. 02/05/14  Yes Alla GermanGregory Formanek, MD  pantoprazole (PROTONIX) 40 MG tablet Take 1 tablet (40 mg total) by mouth 2 (two) times daily before a meal. 01/10/14  Yes Gwenyth BenderKaren M Black, NP  Rivaroxaban (XARELTO) 15 MG TABS tablet Take 1 tablet (15 mg total) by mouth daily with breakfast. 01/10/14  Yes Gwenyth BenderKaren M Black, NP  spironolactone (ALDACTONE) 50 MG tablet Take 1 tablet (50 mg total) by mouth daily. 01/23/14  Yes Tiffany KocherLeslie S Lewis, PA-C    Current Facility-Administered Medications  Medication Dose Route Frequency Provider Last Rate Last Dose  . 0.9 %  sodium chloride infusion   Intravenous STAT Donnetta HutchingBrian Cook, MD 250 mL/hr at 03/24/2014 1450    . 0.9 %  sodium chloride infusion   Intravenous Once West BaliSandi L Lynnett Langlinais, MD      . antiseptic oral rinse (CPC / CETYLPYRIDINIUM CHLORIDE 0.05%) solution 7 mL  7 mL Mouth Rinse BID Esperanza SheetsUlugbek N Buriev, MD   7 mL at 02/23/2014 1445  . folic acid (FOLVITE) tablet 1 mg  1 mg Oral Daily Esperanza SheetsUlugbek N Buriev, MD      . lactulose (CHRONULAC) 10 GM/15ML solution 20 g  20 g Oral BID Esperanza SheetsUlugbek N Buriev, MD      . levofloxacin (LEVAQUIN) IVPB 750 mg  750 mg Intravenous Q48H Esperanza SheetsUlugbek N Buriev, MD      . LORazepam (ATIVAN) injection 0.5 mg  0.5 mg Intravenous Q4H PRN Esperanza SheetsUlugbek N Buriev, MD      . morphine 2 MG/ML injection 1 mg  1 mg Intravenous Q4H PRN Esperanza SheetsUlugbek N Buriev, MD      . octreotide (SANDOSTATIN) 2 mcg/mL load  via infusion 25 mcg  25 mcg Intravenous Once Esperanza SheetsUlugbek N Buriev, MD       And  . octreotide (SANDOSTATIN) 500 mcg in sodium chloride 0.9 % 250 mL (2 mcg/mL) infusion  50 mcg/hr Intravenous Continuous Esperanza SheetsUlugbek N Buriev, MD      . pantoprazole (PROTONIX) 80 mg in  sodium chloride 0.9 % 100 mL IVPB  80 mg Intravenous Q12H Esperanza SheetsUlugbek N Buriev, MD      . sodium chloride 0.9 % bolus 500 mL  500 mL Intravenous PRN Esperanza SheetsUlugbek N Buriev, MD      . sodium chloride 0.9 % injection 3 mL  3 mL Intravenous Q12H Esperanza SheetsUlugbek N Buriev, MD       Allergies as of 03/15/2014  . (No Known Allergies)   Family History  Problem Relation Age of Onset  . Colon cancer Neg Hx   . Cancer Mother    History   Social History  . Marital Status: Single    Spouse Name: N/A    Number of Children: N/A  . Years of Education: N/A   Occupational History  . Not on file.   Social History Main Topics  . Smoking status: Current Every Day Smoker -- 0.25 packs/day for 44 years    Types: Cigarettes  . Smokeless tobacco: Never Used     Comment: 08/30/2013 "was smoking 2 ppd; cut down to 4-5 cigarettes/day ~ 8 months ago"  . Alcohol Use: Yes     Comment: binges intermittently last 1-2 yrs  . Drug Use: No  . Sexual Activity: Yes   Other Topics Concern  . Not on file   Social History Narrative    Review of Systems: PER HPI OTHERWISE ALL SYSTEMS ARE NEGATIVE. SPOKE TO BLOOD BANK 1539: 2 B POS, 3 O NEG, AND 7 O POS AVAILABLE IN BLOOD BANK.    Vitals: Blood pressure 83/43, pulse 104, resp. rate 21, SpO2 85 %.  Physical Exam: General:   Alert,  cooperative in NAD Head:  Normocephalic and atraumatic. Eyes:  Sclera clear, icterus BILATERALLY.   Conjunctiva pink. Neck:  Supple; no masses. Lungs:  Clear throughout to auscultation.   No wheezes. No acute distress. Heart:  Regular rate and rhythm; no murmurs. Abdomen:  Soft, MILD TTP IN RLQ/RUQ, NO REBOUND OR GUARDING, nondistended. No masses noted. Normal bowel sounds, without guarding,  and without rebound.   Msk:  Symmetrical without gross deformities. Normal posture. Extremities:  Without edema. Neurologic:  Alert and  oriented x4;  grossly normal neurologically. Cervical Nodes:  No significant cervical adenopathy. Psych:  Alert and cooperative. Normal mood and affect.  Lab Results:  Recent Labs  03/12/2014 1040  WBC 12.1*  HGB 7.0*  HCT 21.3*  PLT 94*   BMET  Recent Labs  03/18/2014 1040  NA 142  K 5.6*  CL 103  CO2 16*  GLUCOSE 90  BUN 46*  CREATININE 1.87*  CALCIUM 8.0*   LFT  Recent Labs  02/24/2014 1040  PROT 4.9*  ALBUMIN 1.9*  AST 206*  ALT 71*  ALKPHOS 82  BILITOT 4.3*     Studies/Results: AAS NOV 3 NACPD   LOS: 0 days   Benen Weida  03/03/2014, 3:11 PM

## 2014-02-25 NOTE — ED Notes (Signed)
Daughter visited briefly but had to leave to pick up children. Swabs provided to pt to wet his mouth. C/O nausea. Pt medicatede with Zofran.

## 2014-02-25 NOTE — Progress Notes (Signed)
Dr. Darrick PennaFields at bedside ordering intubation, central line, and bedside EGD. Patient alert and aware and in agreement.  Anestesia intubated patient at 1540, 7.5 ETT, 23 lip with positive color change and positive bilateral breath sounds. Central line placed by Dr. York SpanielBuriev via US. EGD done at bedside with Dr. Darrick PennaFields. Patient sedated and VSS at this time.

## 2014-02-25 NOTE — ED Notes (Signed)
Lab redrawing for type and screen.

## 2014-02-25 NOTE — H&P (View-Only) (Signed)
Referring Provider: No ref. provider found Primary Care Physician:  Augustine RadarOBERSON, KRISTINA, MD Primary Gastroenterologist:  Jonette EvaSandi Puja Caffey  Reason for Consultation:  MELENA, HEMATEMEISIS   Impression: ADMITTED WITH UGI BLEED, HYPOTENSION ANS HYPOXIA. LAST EPISODE OF HEMATEMESIS THIS AM. GI BLEED MOST LIKELY DUE TO ESOPHAGEAL VARICEAL BLEED.  Plan: 1. INTUBATE PT ASAP 2. CENTRAL LINE ASAP 3. PRESSORS ASAP.  4. TRANSFUSE ASAP 5. REGLAN 20 MG IV x1 NOW.  6. EGD/? VARICEAL BANDING ASAP.  7. CONTINUE OCTREOTIDE DRIP, & PPI.  1546: CALLED PT'S DAUGHTER. LVM RE: EGD. 1531: INR 2.68. ORDERED VIT K 10 MG IV AND 2u FFP. KEEP 4U pRBCS AVAILABLE AT ALL TIMES. CHILD PUGH C, MELD SCORE 29.  HPI:  PT LAST ADMITTED SEP 2015. HAS ABSTAINED FROM ETOH SINCE AT LEAST SEP 2015. PT KNOWN CHILD PUGH B WITH INCREASING LIVER ENZYMES. IN HIS USUAL STATE OF HEALTH UNTIL SAT WHEN HE DEVELOPED BLACK TARRY STOOLS FOLLOWED BY HEMATEMESIS(LARGE AMOUNT). HAS HAD DAILY HEMATEMESIS AND MELENA. FRIENDS BROUGHT HIM TO THE ED. PT UNABLE TO TOLERATE PO. EATS AND THEN HE VOMITS. HIS XARELTO HAS BEEN ON HOLD. PT HAS ALSO HAD A COUGH. SUBJECTIVE CHILLS. C/O R SIDED ABDOMINAL PAIN.  PT DENIES CHILLS, diarrhea, CHEST PAIN, SHORTNESS OF BREATH,  problems swallowing, OR heartburn or indigestion.  Past Medical History  Diagnosis Date  . Malnutrition of moderate degree   . Altered mental status   . Unspecified essential hypertension   . Anxiety state, unspecified   . Transient ischemic attack (TIA), and cerebral infarction without residual deficits(V12.54)   . Body mass index between 19-24, adult   . Atrial fibrillation     episode at [place].  Marland Kitchen. Heart murmur   . Obstructive sleep apnea (adult) (pediatric)     to start back after surgery- not used for 2-623months  . GERD (gastroesophageal reflux disease)   . Unspecified epilepsy without mention of intractable epilepsy 13    only once  . Hand laceration 80    end of all  fingers on rt hand  . Old myocardial infarction   . Asthma     "outgrew it"  . Borderline diabetes   . 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 Acute alcoholic hepatitis     possible hepatic encephalopathy May 10, 2011  . Daily headache   . Migraine     "daily" (08/30/2013)  . Stroke ~ 2000    "mild"  . Arthritis     "I'm ate up w/it" (08/30/2013)  . Chronic lower back pain   . On home oxygen therapy     "2L prn" (08/30/2013)  . Chronic kidney disease     "one isn't functioning like it should" (08/30/2013)  . Cirrhosis   . Esophageal varices   . Thrombus     chronic,junction of splenic portal vein  . Pulmonary nodules     chronic an new 2015  . Cataract    Past Surgical History  Procedure Laterality Date  . Clavicle surgery Left ~ 2008    "crushed it"  . Hand surgery Left     lacerations of thumb. reattached  . Nasal septum surgery  08/30/2013  . Rhinoplasty  08/30/2013  . Hand surgery Right 1980's    "all 4 fingers cut off and reattached"  . Foot fracture surgery Right   . Septoplasty N/A 08/30/2013    Procedure: SEPTOPLASTY;  Surgeon: Serena ColonelJefry Rosen, MD;  [facility];  Service: ENT;  Laterality: N/A;  . Rhinoplasty N/A 08/30/2013    Procedure: RHINOPLASTY;  Surgeon: Serena ColonelJefry Rosen, MD;  [facility];  Service: ENT;  Laterality: N/A;  . Esophagogastroduodenoscopy  Dec 2014    Dr. Samuella CotaPandya: Grade 2-3 esophageal varices s/p banding X 5. Erosive gastritis. Negative H.pylori  . Paracentesis      multiple    Prior to Admission medications   Medication Sig Start Date End Date Taking? Authorizing Provider  folic acid (FOLVITE) 1 MG tablet Take 1 tablet (1 mg total) by mouth daily. 01/10/14  Yes Lesle ChrisKaren M Black, NP  furosemide (LASIX) 20 MG tablet Take 1 tablet (20 mg total) by mouth daily. 01/23/14  Yes Tiffany KocherLeslie S Lewis, PA-C  Lactulose 20 GM/30ML SOLN Take 30 mLs (20 g total) by mouth 2 (two) times daily. 01/10/14  Yes Lesle ChrisKaren M Black, NP  metoprolol  (LOPRESSOR) 50 MG tablet Take 0.5 tablets (25 mg total) by mouth daily. 01/10/14  Yes Lesle ChrisKaren M Black, NP  Multiple Vitamins-Minerals (CENTRUM SILVER PO) Take 1 tablet by mouth daily.   Yes Historical Provider, MD  ondansetron (ZOFRAN) 4 MG tablet Take 4 mg by mouth every 8 (eight) hours as needed for nausea or vomiting.   Yes Historical Provider, MD  oxazepam (SERAX) 10 MG capsule Take 10 mg by mouth 2 (two) times daily.   Yes Historical Provider, MD  oxyCODONE (OXY IR/ROXICODONE) 5 MG immediate release tablet Take 1 tablet (5 mg total) by mouth every 4 (four) hours as needed for severe pain. 02/05/14  Yes Alla GermanGregory Formanek, MD  pantoprazole (PROTONIX) 40 MG tablet Take 1 tablet (40 mg total) by mouth 2 (two) times daily before a meal. 01/10/14  Yes Gwenyth BenderKaren M Black, NP  Rivaroxaban (XARELTO) 15 MG TABS tablet Take 1 tablet (15 mg total) by mouth daily with breakfast. 01/10/14  Yes Gwenyth BenderKaren M Black, NP  spironolactone (ALDACTONE) 50 MG tablet Take 1 tablet (50 mg total) by mouth daily. 01/23/14  Yes Tiffany KocherLeslie S Lewis, PA-C    Current Facility-Administered Medications  Medication Dose Route Frequency Provider Last Rate Last Dose  . 0.9 %  sodium chloride infusion   Intravenous STAT Donnetta HutchingBrian Cook, MD 250 mL/hr at 03/24/2014 1450    . 0.9 %  sodium chloride infusion   Intravenous Once West BaliSandi L Sylver Vantassell, MD      . antiseptic oral rinse (CPC / CETYLPYRIDINIUM CHLORIDE 0.05%) solution 7 mL  7 mL Mouth Rinse BID Esperanza SheetsUlugbek N Buriev, MD   7 mL at 02/23/2014 1445  . folic acid (FOLVITE) tablet 1 mg  1 mg Oral Daily Esperanza SheetsUlugbek N Buriev, MD      . lactulose (CHRONULAC) 10 GM/15ML solution 20 g  20 g Oral BID Esperanza SheetsUlugbek N Buriev, MD      . levofloxacin (LEVAQUIN) IVPB 750 mg  750 mg Intravenous Q48H Esperanza SheetsUlugbek N Buriev, MD      . LORazepam (ATIVAN) injection 0.5 mg  0.5 mg Intravenous Q4H PRN Esperanza SheetsUlugbek N Buriev, MD      . morphine 2 MG/ML injection 1 mg  1 mg Intravenous Q4H PRN Esperanza SheetsUlugbek N Buriev, MD      . octreotide (SANDOSTATIN) 2 mcg/mL load  via infusion 25 mcg  25 mcg Intravenous Once Esperanza SheetsUlugbek N Buriev, MD       And  . octreotide (SANDOSTATIN) 500 mcg in sodium chloride 0.9 % 250 mL (2 mcg/mL) infusion  50 mcg/hr Intravenous Continuous Esperanza SheetsUlugbek N Buriev, MD      . pantoprazole (PROTONIX) 80 mg in  sodium chloride 0.9 % 100 mL IVPB  80 mg Intravenous Q12H Esperanza SheetsUlugbek N Buriev, MD      . sodium chloride 0.9 % bolus 500 mL  500 mL Intravenous PRN Esperanza SheetsUlugbek N Buriev, MD      . sodium chloride 0.9 % injection 3 mL  3 mL Intravenous Q12H Esperanza SheetsUlugbek N Buriev, MD       Allergies as of 03/15/2014  . (No Known Allergies)   Family History  Problem Relation Age of Onset  . Colon cancer Neg Hx   . Cancer Mother    History   Social History  . Marital Status: Single    Spouse Name: N/A    Number of Children: N/A  . Years of Education: N/A   Occupational History  . Not on file.   Social History Main Topics  . Smoking status: Current Every Day Smoker -- 0.25 packs/day for 44 years    Types: Cigarettes  . Smokeless tobacco: Never Used     Comment: 08/30/2013 "was smoking 2 ppd; cut down to 4-5 cigarettes/day ~ 8 months ago"  . Alcohol Use: Yes     Comment: binges intermittently last 1-2 yrs  . Drug Use: No  . Sexual Activity: Yes   Other Topics Concern  . Not on file   Social History Narrative    Review of Systems: PER HPI OTHERWISE ALL SYSTEMS ARE NEGATIVE. SPOKE TO BLOOD BANK 1539: 2 B POS, 3 O NEG, AND 7 O POS AVAILABLE IN BLOOD BANK.    Vitals: Blood pressure 83/43, pulse 104, resp. rate 21, SpO2 85 %.  Physical Exam: General:   Alert,  cooperative in NAD Head:  Normocephalic and atraumatic. Eyes:  Sclera clear, icterus BILATERALLY.   Conjunctiva pink. Neck:  Supple; no masses. Lungs:  Clear throughout to auscultation.   No wheezes. No acute distress. Heart:  Regular rate and rhythm; no murmurs. Abdomen:  Soft, MILD TTP IN RLQ/RUQ, NO REBOUND OR GUARDING, nondistended. No masses noted. Normal bowel sounds, without guarding,  and without rebound.   Msk:  Symmetrical without gross deformities. Normal posture. Extremities:  Without edema. Neurologic:  Alert and  oriented x4;  grossly normal neurologically. Cervical Nodes:  No significant cervical adenopathy. Psych:  Alert and cooperative. Normal mood and affect.  Lab Results:  Recent Labs  03/12/2014 1040  WBC 12.1*  HGB 7.0*  HCT 21.3*  PLT 94*   BMET  Recent Labs  03/18/2014 1040  NA 142  K 5.6*  CL 103  CO2 16*  GLUCOSE 90  BUN 46*  CREATININE 1.87*  CALCIUM 8.0*   LFT  Recent Labs  02/24/2014 1040  PROT 4.9*  ALBUMIN 1.9*  AST 206*  ALT 71*  ALKPHOS 82  BILITOT 4.3*     Studies/Results: AAS NOV 3 NACPD   LOS: 0 days   Lenis Nettleton  03/03/2014, 3:11 PM

## 2014-02-25 NOTE — Progress Notes (Addendum)
[facility] PROGRESS NOTE  Nelle DonHerbert E Sawyer AVW:[id number] DOB: 01/31/1956 DOA: 02/23/2014 PCP: Augustine RadarOBERSON, KRISTINA, MD  Patient developed persistent hypotension, likely hemorrhagic shock;  -urgent L IJ central line access obtained under sterile condition, with the guidance of US without immediate complications;  -Pt is intubated for airway protection  -GI is planning to perform urgent endoscopy    Prognosis is guarded;  Esperanza SheetsBURIEV, Maly Lemarr N  [facility] Pager [phone]. If 7PM-7AM, please contact night-coverage at [email or web address], password [email or web address] 03/24/2014, 5:03 PM  LOS: 0 days       [facility] to monitor patient on the vent with possible extubation in AM is stable   Takesha Steger

## 2014-02-25 NOTE — ED Notes (Signed)
Pt states he has been vomiting blood for three days. Also, complain of abdominal pain

## 2014-02-25 NOTE — Op Note (Addendum)
[facility] [address] [place], [id number]   ENDOSCOPY PROCEDURE REPORT  PATIENT: Darren Abbott, Darren Abbott  MR#: [id number] BIRTHDATE: [birth year] , 58  yrs. old GENDER: male  ENDOSCOPIST: Jonette EvaSandi Whitten Andreoni, MD REFERRED BY: PROCEDURE DATE: 03/15/2014 PROCEDURE:   Esophagoscopy  with variceal banding  INDICATIONS:hematemesis.   melena. MEDICATIONS: Versed 2 mg IV and Propofol TOPICAL ANESTHETIC:   none ASA CLASS:  DESCRIPTION OF PROCEDURE:     Physical exam was performed.  Informed consent was obtained from the patient after explaining the benefits, risks, and alternatives to the procedure.  The patient was connected to the monitor and placed in the left lateral position.  Continuous oxygen was provided by nasal cannula and IV medicine administered through an indwelling cannula.  After administration of sedation, the patients esophagus was intubated and the     endoscope was advanced under direct visualization to the second portion of the duodenum.  The scope was removed slowly by carefully examining the color, texture, anatomy, and integrity of the mucosa on the way out.  The patient was recovered in endoscopy and discharged home in satisfactory condition.   ESOPHAGUS: There were 5 columns of large varices in the lower third of the esophagus.  There WAS ONE VARIX WITH evidence of a white nipple sign.  4 BANDING SITES IN DISTAL ESOPHAGUS. COMPLICATIONS: There were no immediate complications. PT VOMITED BLOOD DURING PROCEDURE.  ENDOSCOPIC IMPRESSION: GI BLEED DUE TO ESOPHAGEAL VARIX IN PT WHO NOW AS A MELD SCORE OF 29 AND IS CHILD PUGH C.  RECOMMENDATIONS: Titrate Propofol to sedate. OCTREOTIDE FOR 72 HRS. Keep MAP 60-70 or SBP 90-96 KEEP Hb 8-9. COMPLETE 2uPrbcS & FFP TONIGHT PT NEEDS TIPS IF HE REBLEEDS.  OTHERWISE, REPEAT EGD IN 2-3 WEEKS IF PT REMAINS W/O ACTIVE GI BLEED. VITAMIN K 10 MG IV DAILY FOR 3 DAYS. LEVAQUIN OR CIPRO FOR 7 DAYS CONTINUE WITH FLUID  RESUSCITATION. MONITOR FOR PULMONARY EDEMA IN PT WITH ALBUMIN 1.9. WILL RE-ASSESS IN AM. DISCUSSED THE SERIOUS/CRTICAL CONDITION OF Darren Abbott WITH HIS DAUGHTER. REPEAT EXAM: _______________________________ Rosalie DoctoreSignedJonette Eva:  Darren Haring, MD 03/16/2014 6:48 PM Revised: 03/16/2014 6:48 PM    CPT CODES: ICD CODES:  The ICD and CPT codes recommended by this software are interpretations from the data that the clinical staff has captured with the software.  The verification of the translation of this report to the ICD and CPT codes and modifiers is the sole responsibility of the health care institution and practicing physician where this report was generated.  [employer] will not be held responsible for the validity of the ICD and CPT codes included on this report.  AMA assumes no liability for data contained or not contained herein. CPT is a Publishing rights managerregistered trademark of the [employer].

## 2014-02-25 NOTE — Telephone Encounter (Signed)
Pt admitted with decrease BP/Hb.

## 2014-02-25 NOTE — ED Provider Notes (Signed)
CSN: [id number]     Arrival date & time 03/14/2014  1014 History  This chart was scribed for Donnetta Hutching, MD by Annye Asa, ED Scribe. This patient was seen in room APA18/APA18 and the patient's care was started at 10:25 AM.    Chief Complaint  Patient presents with  . GI Bleeding   The history is provided by the patient. No language interpreter was used.     HPI Comments:level V caveat for urgent need for intervention Darren Abbott is a 58 y.o. male with past medical history of EtOH abuse who presents to the Emergency Department complaining of hematemesis and abdominal pain. Patient reports that three days PTA, patient noticed dark, bloody stool - it progressed to vomiting blood soon after. He reports a "significant" amount of blood. He cannot take his medicine without vomiting it back up. He denies prior experience with these symptoms.   He also reports headache.   Past Medical History  Diagnosis Date  . Malnutrition of moderate degree   . Altered mental status   . Unspecified essential hypertension   . Anxiety state, unspecified   . Transient ischemic attack (TIA), and cerebral infarction without residual deficits(V12.54)   . Body mass index between 19-24, adult   . Atrial fibrillation     episode at [place].  Marland Kitchen Heart murmur   . Obstructive sleep apnea (adult) (pediatric)     to start back after surgery- not used for 2-71months  . GERD (gastroesophageal reflux disease)   . Unspecified epilepsy without mention of intractable epilepsy 13    only once  . Hand laceration 80    end of all fingers on rt hand  . Old myocardial infarction   . Asthma     "outgrew it"  . Borderline diabetes   . 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 Acute alcoholic hepatitis     possible hepatic encephalopathy May 10, 2011  . Daily headache   . Migraine     "daily" (08/30/2013)  . Stroke ~ 2000    "mild"  . Arthritis     "I'm ate up w/it" (08/30/2013)  .  Chronic lower back pain   . On home oxygen therapy     "2L prn" (08/30/2013)  . Chronic kidney disease     "one isn't functioning like it should" (08/30/2013)  . Cirrhosis   . Esophageal varices   . Thrombus     chronic,junction of splenic portal vein  . Pulmonary nodules     chronic an new 2015  . Cataract    Past Surgical History  Procedure Laterality Date  . Clavicle surgery Left ~ 2008    "crushed it"  . Hand surgery Left     lacerations of thumb. reattached  . Nasal septum surgery  08/30/2013  . Rhinoplasty  08/30/2013  . Hand surgery Right 1980's    "all 4 fingers cut off and reattached"  . Foot fracture surgery Right   . Septoplasty N/A 08/30/2013    Procedure: SEPTOPLASTY;  Surgeon: Serena Colonel, MD;  Location: [facility] OR;  Service: ENT;  Laterality: N/A;  . Rhinoplasty N/A 08/30/2013    Procedure: RHINOPLASTY;  Surgeon: Serena Colonel, MD;  Location: [facility] OR;  Service: ENT;  Laterality: N/A;  . Esophagogastroduodenoscopy  Dec 2014    Dr. Samuella Cota: Grade 2-3 esophageal varices s/p banding X 5. Erosive gastritis. Negative H.pylori  . Paracentesis      multiple  Family History  Problem Relation Age of Onset  . Colon cancer Neg Hx   . Cancer Mother    History  Substance Use Topics  . Smoking status: Current Every Day Smoker -- 0.25 packs/day for 44 years    Types: Cigarettes  . Smokeless tobacco: Never Used     Comment: 08/30/2013 "was smoking 2 ppd; cut down to 4-5 cigarettes/day ~ 8 months ago"  . Alcohol Use: Yes     Comment: binges intermittently last 1-2 yrs    Review of Systems  Unable to perform ROS: Acuity of condition    A complete 10 system review of systems was obtained and all systems are negative except as noted in the HPI and PMH.   Allergies  Review of patient's allergies indicates no known allergies.  Home Medications   Prior to Admission medications   Medication Sig Start Date End Date Taking? Authorizing Provider  folic acid (FOLVITE) 1 MG tablet Take 1  tablet (1 mg total) by mouth daily. 01/10/14  Yes Lesle ChrisKaren M Black, NP  furosemide (LASIX) 20 MG tablet Take 1 tablet (20 mg total) by mouth daily. 01/23/14  Yes Tiffany KocherLeslie S Lewis, PA-C  Lactulose 20 GM/30ML SOLN Take 30 mLs (20 g total) by mouth 2 (two) times daily. 01/10/14  Yes Lesle ChrisKaren M Black, NP  metoprolol (LOPRESSOR) 50 MG tablet Take 0.5 tablets (25 mg total) by mouth daily. 01/10/14  Yes Lesle ChrisKaren M Black, NP  Multiple Vitamins-Minerals (CENTRUM SILVER PO) Take 1 tablet by mouth daily.   Yes Historical Provider, MD  ondansetron (ZOFRAN) 4 MG tablet Take 4 mg by mouth every 8 (eight) hours as needed for nausea or vomiting.   Yes Historical Provider, MD  oxazepam (SERAX) 10 MG capsule Take 10 mg by mouth 2 (two) times daily.   Yes Historical Provider, MD  oxyCODONE (OXY IR/ROXICODONE) 5 MG immediate release tablet Take 1 tablet (5 mg total) by mouth every 4 (four) hours as needed for severe pain. 02/05/14  Yes Alla GermanGregory Formanek, MD  pantoprazole (PROTONIX) 40 MG tablet Take 1 tablet (40 mg total) by mouth 2 (two) times daily before a meal. 01/10/14  Yes Gwenyth BenderKaren M Black, NP  Rivaroxaban (XARELTO) 15 MG TABS tablet Take 1 tablet (15 mg total) by mouth daily with breakfast. 01/10/14  Yes Gwenyth BenderKaren M Black, NP  spironolactone (ALDACTONE) 50 MG tablet Take 1 tablet (50 mg total) by mouth daily. 01/23/14  Yes Leanna BattlesLeslie S Lewis, PA-C   BP 105/42 mmHg  Pulse 104  Resp 16  SpO2 100% Physical Exam  Constitutional: He is oriented to person, place, and time. He appears well-developed and well-nourished.  Cachectic looking  HENT:  Head: Normocephalic and atraumatic.  Eyes: Conjunctivae and EOM are normal. Pupils are equal, round, and reactive to light.  Neck: Normal range of motion. Neck supple.  Cardiovascular: Regular rhythm and normal heart sounds.   Tachycardic  Pulmonary/Chest: Effort normal and breath sounds normal.  Abdominal: Soft. Bowel sounds are normal.  Distended abdomen  Genitourinary:  Rectal exam: No  masses, weakly heme positive  Musculoskeletal: Normal range of motion.  Neurological: He is alert and oriented to person, place, and time.  Skin: Skin is warm and dry.  Psychiatric: He has a normal mood and affect. His behavior is normal.  Nursing note and vitals reviewed.   ED Course  Procedures   DIAGNOSTIC STUDIES: Oxygen Saturation is 100% on Ra, normal by my interpretation.    COORDINATION OF CARE: 10:31 AM Discussed treatment  plan with pt at bedside, including admission to the hospital, and pt agreed to plan.   Labs Review Labs Reviewed  CBC WITH DIFFERENTIAL - Abnormal; Notable for the following:    WBC 12.1 (*)    RBC 2.25 (*)    Hemoglobin 7.0 (*)    HCT 21.3 (*)    RDW 15.7 (*)    Platelets 94 (*)    Neutrophils Relative % 81 (*)    Neutro Abs 9.8 (*)    Lymphocytes Relative 11 (*)    All other components within normal limits  COMPREHENSIVE METABOLIC PANEL - Abnormal; Notable for the following:    Potassium 5.6 (*)    CO2 16 (*)    BUN 46 (*)    Creatinine, Ser 1.87 (*)    Calcium 8.0 (*)    Total Protein 4.9 (*)    Albumin 1.9 (*)    AST 206 (*)    ALT 71 (*)    Total Bilirubin 4.3 (*)    GFR calc non Af Amer 38 (*)    GFR calc Af Amer 44 (*)    All other components within normal limits  POC OCCULT BLOOD, ED - Abnormal; Notable for the following:    Fecal Occult Bld POSITIVE (*)    All other components within normal limits  LIPASE, BLOOD  AMMONIA  TYPE AND SCREEN  PREPARE RBC (CROSSMATCH)    Imaging Review Dg Abd Acute W/chest  03/04/2014   CLINICAL DATA:  58 year old male with abdominal pain in all quadrants, gastrointestinal bleeding. Blood in stool for 4 days with nausea and vomiting. Initial encounter.  EXAM: ACUTE ABDOMEN SERIES (ABDOMEN 2 VIEW & CHEST 1 VIEW)  COMPARISON:  Chest CT 01/08/2014 and earlier. CT Abdomen and Pelvis 01/06/2014.  FINDINGS: AP upright view of the chest. Stable lung volumes. The lungs are clear. Stable left clavicle  ORIF. Normal cardiac size and mediastinal contours. No pneumothorax or pneumoperitoneum identified.  Gasless abdomen. Wallace Cullens hazy opacity throughout the abdomen suggestive of ascites which was present on 01/06/2014. No acute osseous abnormality identified.  IMPRESSION: 1. No free air. Gasless abdomen with gray hazy opacity throughout suggesting ascites. See also CT Abdomen and Pelvis 01/06/2014 2.  No acute cardiopulmonary abnormality.   Electronically Signed   By: Augusto Gamble M.D.   On: 03/03/2014 12:24     EKG Interpretation   Date/Time:  Tuesday February 25 2014 10:15:45 EST Ventricular Rate:  104 PR Interval:  142 QRS Duration: 97 QT Interval:  412 QTC Calculation: 542 R Axis:   82 Text Interpretation:  Sinus tachycardia Low voltage, precordial leads  Prolonged QT interval Confirmed by Adriana Simas  MD, Jimesha Rising ([id number]) on 03/03/2014  10:43:07 AM     CRITICAL CARE Performed by: Donnetta Hutching  ?  Total critical care time: 35  Critical care time was exclusive of separately billable procedures and treating other patients.  Critical care was necessary to treat or prevent imminent or life-threatening deterioration.  Critical care was time spent personally by me on the following activities: development of treatment plan with patient and/or surrogate as well as nursing, discussions with consultants, evaluation of patient's response to treatment, examination of patient, obtaining history from patient or surrogate, ordering and performing treatments and interventions, ordering and review of laboratory studies, ordering and review of radiographic studies, pulse oximetry and re-evaluation of patient's condition. MDM   Final diagnoses:  Abdominal pain  Upper GI bleed  Lower GI bleed  Cirrhosis of liver with ascites, unspecified hepatic  cirrhosis type  Chronic hepatitis C without hepatic coma   I personally performed the services described in this documentation, which was scribed in my presence. The  recorded information has been reviewed and is accurate.   Patient has a multitude of potentially life-threatening health problems including cirrhosis, ascites, malnutrition, esophageal varices.  He presents with both upper and lower GI bleeding. Blood pressure initially soft, but is improved with IV hydration. Rectal exam heme positive. Patient is now anemic with a hemoglobin less than 8. Plan to give vigorous IV hydration, transfuse, discussed with hospitalist Dr.Buriev.  Admit   Donnetta HutchingBrian Trinitie Mcgirr, MD 03/04/14 [phone]

## 2014-02-25 NOTE — ED Notes (Signed)
Pt states he vomited blood all over his house. States he had to crawl in it to get to the door to open it for EMS. Pt has dried blood on mouth, hands and feet

## 2014-02-25 NOTE — Progress Notes (Signed)
ANTIBIOTIC CONSULT NOTE - INITIAL  Pharmacy Consult for Levaquin Indication: intra abdominal infection  No Known Allergies  Patient Measurements:   Last recorded weight = 74Kg  Vital Signs: BP: 90/47 mmHg (11/03 1311) Pulse Rate: 104 (11/03 1135) Intake/Output from previous day:   Intake/Output from this shift:    Labs:  Recent Labs  12/19/2013 1040  WBC 12.1*  HGB 7.0*  PLT 94*  CREATININE 1.87*   CrCl cannot be calculated (Unknown ideal weight.). No results for input(s): VANCOTROUGH, VANCOPEAK, VANCORANDOM, GENTTROUGH, GENTPEAK, GENTRANDOM, TOBRATROUGH, TOBRAPEAK, TOBRARND, AMIKACINPEAK, AMIKACINTROU, AMIKACIN in the last 72 hours.   Microbiology: Recent Results (from the past 720 hour(s))  Anaerobic culture     Status: None   Collection Time: 01/27/14  1:40 PM  Result Value Ref Range Status   Specimen Description ASCITIC FLUID  Final   Special Requests NONE  Final   Gram Stain   Final    NO WBC SEEN NO ORGANISMS SEEN Performed at [facility]   Culture   Final    NO ANAEROBES ISOLATED Performed at [facility]   Report Status 02/01/2014 FINAL  Final  Body fluid culture     Status: None   Collection Time: 01/27/14  1:40 PM  Result Value Ref Range Status   Specimen Description ASCITIC FLUID  Final   Special Requests NONE  Final   Gram Stain   Final    NO WBC SEEN NO ORGANISMS SEEN Performed at [facility]   Culture   Final    NO GROWTH 3 DAYS Performed at [facility]   Report Status 01/31/2014 FINAL  Final   Medical History: Past Medical History  Diagnosis Date  . Malnutrition of moderate degree   . Altered mental status   . Unspecified essential hypertension   . Anxiety state, unspecified   . Transient ischemic attack (TIA), and cerebral infarction without residual deficits(V12.54)   . Body mass index between 19-24, adult   . Atrial fibrillation     episode at [place].  Marland Kitchen. Heart murmur   . Obstructive sleep  apnea (adult) (pediatric)     to start back after surgery- not used for 2-663months  . GERD (gastroesophageal reflux disease)   . Unspecified epilepsy without mention of intractable epilepsy 13    only once  . Hand laceration 80    end of all fingers on rt hand  . Old myocardial infarction   . Asthma     "outgrew it"  . Borderline diabetes   . 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 Acute alcoholic hepatitis     possible hepatic encephalopathy May 10, 2011  . Daily headache   . Migraine     "daily" (08/30/2013)  . Stroke ~ 2000    "mild"  . Arthritis     "I'm ate up w/it" (08/30/2013)  . Chronic lower back pain   . On home oxygen therapy     "2L prn" (08/30/2013)  . Chronic kidney disease     "one isn't functioning like it should" (08/30/2013)  . Cirrhosis   . Esophageal varices   . Thrombus     chronic,junction of splenic portal vein  . Pulmonary nodules     chronic an new 2015  . Cataract    Medications:  Scheduled:  . sodium chloride   Intravenous STAT  . sodium chloride   Intravenous Once  . antiseptic oral rinse  7 mL Mouth Rinse BID  . folic acid  1 mg Oral Daily  . lactulose  20 g Oral BID  . levofloxacin (LEVAQUIN) IV  750 mg Intravenous Q48H  . octreotide  25 mcg Intravenous Once  . pantoprazole (PROTONIX) IV  80 mg Intravenous Q12H  . sodium chloride  3 mL Intravenous Q12H   Assessment: 58yo male with h/o alcoholic cirrhosis admitted with abdominal pain.  Asked to initiate Levaquin for suspected intra abdominal infection.  SCr = 1.87 on admission.  Normalized clcr ~ 40  Goal of Therapy:  Eradicate infection.  Plan:  Levaquin 750mg  IV q48hrs Monitor labs, renal fxn, and cultures  Valrie HartHall, Adlai Nieblas A 03/04/2014,2:51 PM

## 2014-02-25 NOTE — Progress Notes (Signed)
eLink Physician-Brief Progress Note Patient Name: Darren DonHerbert E Abbott DOB: 07/18/1955 MRN: [id number]   Date of Service  03/02/2014  HPI/Events of Note  Intubated for EGD  eICU Interventions  Vent orders given If hypotensive, will use fent gtt instead of propofol gtt     Intervention Category Major Interventions: Procedure - evaluation and supervision  Cecily Lawhorne V. 03/01/2014, 5:24 PM

## 2014-02-26 ENCOUNTER — Inpatient Hospital Stay (HOSPITAL_COMMUNITY): Payer: Medicaid Other

## 2014-02-26 DIAGNOSIS — R578 Other shock: Secondary | ICD-10-CM | POA: Diagnosis present

## 2014-02-26 DIAGNOSIS — N179 Acute kidney failure, unspecified: Secondary | ICD-10-CM | POA: Diagnosis present

## 2014-02-26 LAB — BASIC METABOLIC PANEL
ANION GAP: 8 (ref 5–15)
BUN: 59 mg/dL — ABNORMAL HIGH (ref 6–23)
CO2: 23 mEq/L (ref 19–32)
Calcium: 6.9 mg/dL — ABNORMAL LOW (ref 8.4–10.5)
Chloride: 108 mEq/L (ref 96–112)
Creatinine, Ser: 2.58 mg/dL — ABNORMAL HIGH (ref 0.50–1.35)
GFR calc Af Amer: 30 mL/min — ABNORMAL LOW (ref >90)
GFR, EST NON AFRICAN AMERICAN: 26 mL/min — AB (ref >90)
Glucose, Bld: 114 mg/dL — ABNORMAL HIGH (ref 70–99)
Potassium: 4.4 mEq/L (ref 3.7–5.3)
SODIUM: 139 meq/L (ref 137–147)

## 2014-02-26 LAB — PROTIME-INR
INR: 1.82 — ABNORMAL HIGH (ref 0.00–1.49)
Prothrombin Time: 21.2 seconds — ABNORMAL HIGH (ref 11.6–15.2)

## 2014-02-26 LAB — HEMOGLOBIN AND HEMATOCRIT, BLOOD
HCT: 27.9 % — ABNORMAL LOW (ref 39.0–52.0)
HCT: 28.6 % — ABNORMAL LOW (ref 39.0–52.0)
HEMATOCRIT: 29 % — AB (ref 39.0–52.0)
HEMOGLOBIN: 9.6 g/dL — AB (ref 13.0–17.0)
HEMOGLOBIN: 10 g/dL — AB (ref 13.0–17.0)
Hemoglobin: 9.9 g/dL — ABNORMAL LOW (ref 13.0–17.0)

## 2014-02-26 LAB — PREPARE FRESH FROZEN PLASMA
UNIT DIVISION: 0
Unit division: 0

## 2014-02-26 LAB — HEPATIC FUNCTION PANEL
ALBUMIN: 1.9 g/dL — AB (ref 3.5–5.2)
ALK PHOS: 80 U/L (ref 39–117)
ALT: 72 U/L — ABNORMAL HIGH (ref 0–53)
AST: 186 U/L — ABNORMAL HIGH (ref 0–37)
BILIRUBIN INDIRECT: 1.8 mg/dL — AB (ref 0.3–0.9)
Bilirubin, Direct: 2.7 mg/dL — ABNORMAL HIGH (ref 0.0–0.3)
Total Bilirubin: 4.5 mg/dL — ABNORMAL HIGH (ref 0.3–1.2)
Total Protein: 4.5 g/dL — ABNORMAL LOW (ref 6.0–8.3)

## 2014-02-26 LAB — PREPARE RBC (CROSSMATCH)

## 2014-02-26 MED ORDER — SODIUM CHLORIDE 0.9 % IV SOLN
0.0000 ug/h | INTRAVENOUS | Status: DC
  Administered 2014-02-26 – 2014-02-27 (×2): 50 ug/h via INTRAVENOUS
  Administered 2014-03-01 – 2014-03-02 (×2): 100 ug/h via INTRAVENOUS
  Administered 2014-03-03: 50 ug/h via INTRAVENOUS
  Administered 2014-03-04: 100 ug/h via INTRAVENOUS
  Filled 2014-02-26 (×5): qty 50

## 2014-02-26 MED ORDER — SODIUM CHLORIDE 0.9 % IV SOLN: Freq: Once | INTRAVENOUS | Status: DC

## 2014-02-26 MED ORDER — NOREPINEPHRINE BITARTRATE 1 MG/ML IV SOLN
0.0000 ug/min | INTRAVENOUS | Status: DC
  Administered 2014-02-26: 40 ug/min via INTRAVENOUS
  Administered 2014-02-27: 14 ug/min via INTRAVENOUS
  Administered 2014-02-28: 10.67 ug/min via INTRAVENOUS
  Filled 2014-02-26 (×4): qty 16

## 2014-02-26 MED ORDER — FENTANYL BOLUS VIA INFUSION
50.0000 ug | INTRAVENOUS | Status: DC | PRN
  Administered 2014-02-27: 50 ug via INTRAVENOUS
  Administered 2014-03-03 – 2014-03-04 (×3): 100 ug via INTRAVENOUS
  Filled 2014-02-26: qty 100

## 2014-02-26 MED ORDER — FENTANYL CITRATE 0.05 MG/ML IJ SOLN
50.0000 ug | Freq: Once | INTRAMUSCULAR | Status: AC
  Administered 2014-02-26: 50 ug via INTRAVENOUS

## 2014-02-26 MED ORDER — NOREPINEPHRINE BITARTRATE 1 MG/ML IV SOLN
INTRAVENOUS | Status: AC
  Filled 2014-02-26: qty 4

## 2014-02-26 MED ORDER — SODIUM CHLORIDE 0.9 % IV SOLN
INTRAVENOUS | Status: DC
  Administered 2014-02-26 – 2014-03-03 (×11): via INTRAVENOUS

## 2014-02-26 NOTE — Progress Notes (Signed)
1 of 2 units of PRBC is infusing at this time based on a CBC value called to Dr Onalee Huaavid. Pt remains well sedated and calm. No obvious signs of bleeding noted. No further

## 2014-02-26 NOTE — Care Management Note (Addendum)
    Page 1 of 1   03/06/2014     2:33:20 PM CARE MANAGEMENT NOTE 03/06/2014  Patient:  Darren Abbott,Darren Abbott   Account Number:  [id number]  Date Initiated:  02/26/2014  Documentation initiated by:  Sharrie RothmanBLACKWELL,TAMMY C  Subjective/Objective Assessment:   Pt admitted from home with gi bleed. Pt currently intubated.     Action/Plan:   Will continue to monitor for discharge planning needs.   Anticipated DC Date:  03/05/2014   Anticipated DC Plan:  HOME W HOME HEALTH SERVICES      DC Planning Services  CM consult      Choice offered to / List presented to:             Status of service:  In process, will continue to follow Medicare Important Message given?   (If response is "NO", the following Medicare IM given date fields will be blank) Date Medicare IM given:   Medicare IM given by:   Date Additional Medicare IM given:   Additional Medicare IM given by:    Discharge Disposition:    Per UR Regulation:    If discussed at Long Length of Stay Meetings, dates discussed:   03/04/2014  03/06/2014    Comments:  03/06/2014 1400 Kathyrn SheriffJessica Childress, RN, MSN, PCCN Pt extubated today and will not be reintubated if he does not tolerate extubation. Family at bedside. Will continue to follow.  03/04/2014 1600 Kathyrn SheriffJessica Childress, RN, MSN, [facility]  02/28/14 1345 Arlyss Queenammy Blackwell, RN BSN CM Pt still on ventilator. Will continue to follow for discharge planning needs.  02/26/14 1330 Arlyss Queenammy Blackwell, RN BSN CM

## 2014-02-26 NOTE — Progress Notes (Signed)
[facility] PROGRESS NOTE  Nelle DonHerbert E Coster ZOX:[id number] DOB: 11/11/1955 DOA: 03/13/2014 PCP: Augustine RadarOBERSON, KRISTINA, MD  Summary:  This is a 58 year old gentleman with known history of alcoholic/hepatitis C cirrhosis and varices presents to the hospital with GI bleeding and abdominal pain. Shortly after admission, he required intubation for airway protection and became hypotensive requiring vasopressors. He was transfused PRBCs and had urgent endoscopy done which showed GI bleed due to esophageal varix.he is currently on a octreotide infusion and is continued on proton pump inhibitors. It is felt that if he rebleeds, he will likely require TIPS procedure. He currently remains on ventilator.  Assessment/Plan: 1. Acute esophageal variceal GI bleeding. Status post endoscopy with results noted as below. He was transfused 2 units of PRBCs. Hemoglobin appears to be stable. Continue octreotide infusion, proton pump inhibitors. Bleeding was further complicated by anticoagulation which is currently on hold. Continue to monitor hemoglobin.Since patient was having significant bleeding, he did require intubation for airway protection. Will request pulmonology consultation to assist with vent management. 2. Acute blood loss anemia. Transfused 2 units PRBCs. Continue to follow hemoglobin. 3. Hemorrhagic shock. Related to #1 and #2. Patient briefly required vasopressor support. Blood pressure has since started to stabilize. He has currently been weaned off of vasopressors. 4. History of alcoholic/hepatitis C cirrhosis. Continue Levaquin for SBP treatment. 5. History of portal vein thrombosis. Not a candidate for anticoagulation with ongoing bleeding. 6. History of paroxysmal atrial fibrillation. Not a candidate for anticoagulation 7. 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 Patient is currently receiving vitamin K and INR is improving. 8. Acute renal failure. Possibly  hepatorenal syndrome. Will restart on intravenous hydration. Request nephrology consultation.  Code Status: full code Family Communication: no family present Disposition Plan: pending further hospital course   Consultants:  GI  pulmonology  Procedures: EGD: ESOPHAGUS: There were 5 columns of large varices in the lower third of the esophagus. There WAS ONE VARIX WITH evidence of a white nipple sign. 4 BANDING SITES IN DISTAL ESOPHAGUS. COMPLICATIONS: There were no immediate complications. PT VOMITED  BLOOD DURING PROCEDURE.  Antibiotics:  levaquin 03/24/2014>>  HPI/Subjective: Patient is intubated and sedated, no reported bleeding overnight  Objective: Filed Vitals:   02/26/14 0900  BP: 95/58  Pulse: 77  Temp:   Resp: 14    Intake/Output Summary (Last 24 hours) at 02/26/14 0931 Last data filed at 02/26/14 0915  Gross per 24 hour  Intake 7473.83 ml  Output    470 ml  Net 7003.83 ml   Filed Weights   2013/06/29 1641 02/26/14 0100  Weight: 66.6 kg (146 lb 13.2 oz) 73 kg (160 lb 15 oz)    Exam:   General:  Sedated, on vent  Cardiovascular: s1, s2 rrr  Respiratory: cta b  Abdomen: soft, nt, nd, bs+  Musculoskeletal: trace edema b/l   Data Reviewed: Basic Metabolic Panel:  Recent Labs Lab 2013/06/29 1040 2013/06/29 1439 02/26/14 0828  NA 142  --  139  K 5.6* 5.0 4.4  CL 103  --  108  CO2 16*  --  23  GLUCOSE 90  --  114*  BUN 46*  --  59*  CREATININE 1.87*  --  2.58*  CALCIUM 8.0*  --  6.9*   Liver Function Tests:  Recent Labs Lab 2013/06/29 1040 02/26/14 0828  AST 206* 186*  ALT 71* 72*  ALKPHOS 82 80  BILITOT 4.3* 4.5*  PROT 4.9* 4.5*  ALBUMIN 1.9* 1.9*  Recent Labs Lab 02/27/2014 1040  LIPASE 44    Recent Labs Lab 03/13/2014 1040  AMMONIA 45   CBC:  Recent Labs Lab 03/04/2014 1040 02/24/2014 1439 03/21/2014 2310 02/26/14 0828  WBC 12.1*  --   --   --   NEUTROABS 9.8*  --   --   --   HGB 7.0* 6.0* 7.4* 9.6*  HCT 21.3* 17.9*  21.5* 27.9*  MCV 94.7  --   --   --   PLT 94*  --   --   --    Cardiac Enzymes: No results for input(s): CKTOTAL, CKMB, CKMBINDEX, TROPONINI in the last 168 hours. BNP (last 3 results) No results for input(s): PROBNP in the last 8760 hours. CBG: No results for input(s): GLUCAP in the last 168 hours.  Recent Results (from the past 240 hour(s))  MRSA PCR Screening     Status: None   Collection Time: 03/13/2014  2:15 PM  Result Value Ref Range Status   MRSA by PCR NEGATIVE NEGATIVE Final    Comment:        The GeneXpert MRSA Assay (FDA approved for NASAL specimens only), is one component of a comprehensive MRSA colonization surveillance program. It is not intended to diagnose MRSA infection nor to guide or monitor treatment for MRSA infections.      Studies: Portable Chest Xray  02/26/2014   CLINICAL DATA:  Acute respiratory failure  EXAM: PORTABLE CHEST - 1 VIEW  COMPARISON:  03/20/2014  FINDINGS: Cardiac shadow is stable. An endotracheal tube is again seen 3.9 cm above the carina. A left central venous line is again noted in the mid portion of the left innominate vein. Lungs are again aerated with patchy bibasilar atelectatic changes. No focal confluent infiltrate is seen. Postsurgical changes in the left clavicle are noted.  IMPRESSION: Overall stable appearance with bibasilar atelectasis.  The left jugular central line does appear to have advanced slightly in the interval from the prior exam   Electronically Signed   By: Alcide CleverMark  Lukens M.D.   On: 02/26/2014 08:00   Dg Chest Port 1 View  03/24/2014   CLINICAL DATA:  Post intubation radiograph.  Subsequent encounter.  EXAM: PORTABLE CHEST - 1 VIEW  COMPARISON:  02/27/2014, 1151 hr  FINDINGS: Support apparatus: Endotracheal tube tip is 41 mm from the carina. There is a LEFT IJ central line with the tip difficult to visualize. The furthest visible tip is in the LEFT brachiocephalic vein. Monitoring leads project over the chest.   Cardiomediastinal Silhouette:  Unchanged.  Lungs: Basilar atelectasis and lower lung volumes.  No pneumothorax.  Effusions:  None.  Other:  None.  IMPRESSION: 1. Endotracheal tube 4 cm from the carina. 2. LEFT IJ central line. The tip appears to be in the superior LEFT brachiocephalic vein. This could also be within the distal internal jugular vein. 3. Low volume chest.   Electronically Signed   By: Andreas NewportGeoffrey  Lamke M.D.   On: 03/07/2014 17:21   Dg Abd Acute W/chest  03/14/2014   CLINICAL DATA:  58 year old male with abdominal pain in all quadrants, gastrointestinal bleeding. Blood in stool for 4 days with nausea and vomiting. Initial encounter.  EXAM: ACUTE ABDOMEN SERIES (ABDOMEN 2 VIEW & CHEST 1 VIEW)  COMPARISON:  Chest CT 01/08/2014 and earlier. CT Abdomen and Pelvis 01/06/2014.  FINDINGS: AP upright view of the chest. Stable lung volumes. The lungs are clear. Stable left clavicle ORIF. Normal cardiac size and mediastinal contours. No pneumothorax or pneumoperitoneum  identified.  Gasless abdomen. Wallace Cullens hazy opacity throughout the abdomen suggestive of ascites which was present on 01/06/2014. No acute osseous abnormality identified.  IMPRESSION: 1. No free air. Gasless abdomen with gray hazy opacity throughout suggesting ascites. See also CT Abdomen and Pelvis 01/06/2014 2.  No acute cardiopulmonary abnormality.   Electronically Signed   By: Augusto Gamble M.D.   On: 03-01-2014 12:24    Scheduled Meds: . sodium chloride   Intravenous Once  . sodium chloride   Intravenous Once  . antiseptic oral rinse  7 mL Mouth Rinse QID  . chlorhexidine  15 mL Mouth Rinse BID  . folic acid  1 mg Oral Daily  . lactulose  20 g Oral BID  . levofloxacin (LEVAQUIN) IV  750 mg Intravenous Q48H  . pantoprazole (PROTONIX) IV  80 mg Intravenous Q12H  . phytonadione (VITAMIN K) IV  10 mg Intravenous Q24H  . sodium chloride  3 mL Intravenous Q12H   Continuous Infusions: . sodium chloride 20 mL/hr at 02/26/14 0700  .  norepinephrine (LEVOPHED) Adult infusion 11 mcg/min (02/26/14 0915)  . octreotide  (SANDOSTATIN)    IV infusion 50 mcg/hr (02/26/14 0900)  . propofol 20 mL/hr at 02/26/14 0900    Principal Problem:   Upper GI bleed Active Problems:   Abdominal pain   Hepatitis C   Esophageal varices   Cirrhosis   GIB (gastrointestinal bleeding)   Hemorrhagic shock    Time spent: critical care time: 30 mins    Romina Divirgilio  [facility] Pager [phone]. If 7PM-7AM, please contact night-coverage at [email or web address], password [facility] 02/26/2014, 9:31 AM  LOS: 1 day

## 2014-02-26 NOTE — Progress Notes (Signed)
UR chart review completed.  

## 2014-02-26 NOTE — Progress Notes (Signed)
INITIAL NUTRITION ASSESSMENT  DOCUMENTATION CODES Per approved criteria  -Non-severe (moderate) malnutrition in the context of chronic illness   INTERVENTION: Follow pt vent status in am.   NUTRITION DIAGNOSIS: Inadequate oral intake related to inability to eat as evidenced by NPO status   Goal: Pt to meet >/= 90% of their estimated nutrition needs    Monitor: Nutrition support, labs and wt changes    Reason for Assessment: Mechanical ventilation and MST =2   58 y.o. male  Admitting Dx: Upper GI bleed  ASSESSMENT: Patient is currently intubated on ventilator support to protect airway.  Hx of cirrhosis and moderate malnutrition. Presents with acute esophageal GI bleeding, acute blood loss anemia, hemorrhagic shock.   MV: 7.4  L/min Temp (24hrs), Avg:97.7 F (36.5 C), Min:97.1 F (36.2 C), Max:98.5 F (36.9 C)  Propofol: 20 ml/hr (provides 528 kcal of lipids every 24 hr at current rate)  Height: Ht Readings from Last 1 Encounters:  02/26/14 5\' 8"  (1.727 m)    Weight: Wt Readings from Last 1 Encounters:  02/26/14 160 lb 15 oz (73 kg)    Ideal Body Weight: 154# (70 gr)  % Ideal Body Weight: 105%  Wt Readings from Last 10 Encounters:  02/26/14 160 lb 15 oz (73 kg)  02/05/14 155 lb 3.2 oz (70.398 kg)  01/23/14 162 lb 6.4 oz (73.664 kg)  01/20/14 159 lb 12.8 oz (72.485 kg)  01/06/14 158 lb (71.668 kg)  01/01/14 150 lb (68.04 kg)  11/26/13 168 lb (76.204 kg)  10/24/13 152 lb (68.947 kg)  09/09/13 156 lb 12.8 oz (71.124 kg)  08/30/13 164 lb 14.5 oz (74.8 kg)    Usual Body Weight: 152-164#  % Usual Body Weight: 100%  BMI:  Body mass index is 24.48 kg/(m^2). normal range  Estimated Nutritional Needs: Kcal: 1582  Protein: 110 gr Fluid: 2200 liters daily  Skin: intact  Diet Order: Diet NPO time specified  EDUCATION NEEDS: -No education needs identified at this time   Intake/Output Summary (Last 24 hours) at 02/26/14 1531 Last data filed at 02/26/14  1528  Gross per 24 hour  Intake 8254.64 ml  Output    720 ml  Net 7534.64 ml    Last BM: 11/4  Labs:   Recent Labs Lab 03/02/2014 1040 03/07/2014 1439 02/26/14 0828  NA 142  --  139  K 5.6* 5.0 4.4  CL 103  --  108  CO2 16*  --  23  BUN 46*  --  59*  CREATININE 1.87*  --  2.58*  CALCIUM 8.0*  --  6.9*  GLUCOSE 90  --  114*    CBG (last 3)  No results for input(s): GLUCAP in the last 72 hours.  Scheduled Meds: . antiseptic oral rinse  7 mL Mouth Rinse QID  . chlorhexidine  15 mL Mouth Rinse BID  . folic acid  1 mg Oral Daily  . lactulose  20 g Oral BID  . levofloxacin (LEVAQUIN) IV  750 mg Intravenous Q48H  . pantoprazole (PROTONIX) IV  80 mg Intravenous Q12H  . phytonadione (VITAMIN K) IV  10 mg Intravenous Q24H  . sodium chloride  3 mL Intravenous Q12H    Continuous Infusions: . sodium chloride 100 mL/hr at 02/26/14 1524  . norepinephrine (LEVOPHED) Adult infusion 15 mcg/min (02/26/14 1500)  . octreotide  (SANDOSTATIN)    IV infusion 50 mcg/hr (02/26/14 1500)  . propofol 20 mL/hr at 02/26/14 1500    Past Medical History  Diagnosis Date  .  Malnutrition of moderate degree   . Altered mental status   . Unspecified essential hypertension   . Anxiety state, unspecified   . Transient ischemic attack (TIA), and cerebral infarction without residual deficits(V12.54)   . Body mass index between 19-24, adult   . Atrial fibrillation     episode at [place].  [place] [facility]. Heart murmur   . Obstructive sleep apnea (adult) (pediatric)     to start back after surgery- not used for 2-303months  . GERD (gastroesophageal reflux disease)   . Unspecified epilepsy without mention of intractable epilepsy 13    only once  . Hand laceration 80    end of all fingers on rt hand  . Old myocardial infarction   . Asthma     "outgrew it"  . Borderline diabetes   . 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 Acute alcoholic hepatitis     possible hepatic  encephalopathy May 10, 2011  . Daily headache   . Migraine     "daily" (08/30/2013)  . Stroke ~ 2000    "mild"  . Arthritis     "I'm ate up w/it" (08/30/2013)  . Chronic lower back pain   . On home oxygen therapy     "2L prn" (08/30/2013)  . Chronic kidney disease     "one isn't functioning like it should" (08/30/2013)  . Cirrhosis   . Esophageal varices   . Thrombus     chronic,junction of splenic portal vein  . Pulmonary nodules     chronic an new 2015  . Cataract     Past Surgical History  Procedure Laterality Date  . Clavicle surgery Left ~ 2008    "crushed it"  . Hand surgery Left     lacerations of thumb. reattached  . Nasal septum surgery  08/30/2013  . Rhinoplasty  08/30/2013  . Hand surgery Right 1980's    "all 4 fingers cut off and reattached"  . Foot fracture surgery Right   . Septoplasty N/A 08/30/2013    Procedure: SEPTOPLASTY;  Surgeon: Serena ColonelJefry Rosen, MD;  [facility];  Service: ENT;  Laterality: N/A;  . Rhinoplasty N/A 08/30/2013    Procedure: RHINOPLASTY;  Surgeon: Serena ColonelJefry Rosen, MD;  [facility];  Service: ENT;  Laterality: N/A;  . Esophagogastroduodenoscopy  Dec 2014    Dr. Samuella CotaPandya: Grade 2-3 esophageal varices s/p banding X 5. Erosive gastritis. Negative H.pylori  . Paracentesis      multiple    Royann ShiversLynn Tylin Force MS,RD,CSG,LDN Office: [phone] Pager: [phone]

## 2014-02-26 NOTE — Progress Notes (Addendum)
Subjective: Remains sedated, mechanically ventilated. Nursing staff deny evidence of overt GI bleeding.   Objective: Vital signs in last 24 hours: Temp:  [97.1 F (36.2 C)-98.5 F (36.9 C)] 97.5 F (36.4 C) (11/04 0630) Pulse Rate:  [25-145] 74 (11/04 0700) Resp:  [14-35] 14 (11/04 0700) BP: (63-160)/(40-137) 99/60 mmHg (11/04 0700) SpO2:  [81 %-100 %] 100 % (11/04 0700) FiO2 (%):  [40 %] 40 % (11/04 0700) Weight:  [146 lb 13.2 oz (66.6 kg)-160 lb 15 oz (73 kg)] 160 lb 15 oz (73 kg) (11/04 0100) Last BM Date: 02/26/2014 General:   Sedated on vent, sallow complexion Lungs: scattered rhonchi Abdomen:  Bowel sounds present, moderate ascites, non-tense.  Extremities:  Without edema. Neurologic:  On ventilator, sedated    Intake/Output from previous day: 11/03 0701 - 11/04 0700 In: 7197 [I.V.:4854.2; Blood:1992.8; IV Piggyback:350] Out: 440 [Urine:440] Intake/Output this shift:    Lab Results:  Recent Labs  03/16/2014 1040 03/18/2014 1439 03/03/2014 2310  WBC 12.1*  --   --   HGB 7.0* 6.0* 7.4*  HCT 21.3* 17.9* 21.5*  PLT 94*  --   --    BMET  Recent Labs  03/03/2014 1040 02/28/2014 1439  NA 142  --   K 5.6* 5.0  CL 103  --   CO2 16*  --   GLUCOSE 90  --   BUN 46*  --   CREATININE 1.87*  --   CALCIUM 8.0*  --    LFT  Recent Labs  03/02/2014 1040  PROT 4.9*  ALBUMIN 1.9*  AST 206*  ALT 71*  ALKPHOS 82  BILITOT 4.3*   PT/INR  Recent Labs  02/28/2014 1439  LABPROT 28.7*  INR 2.68*     Studies/Results: Dg Chest Port 1 View  03/13/2014   CLINICAL DATA:  Post intubation radiograph.  Subsequent encounter.  EXAM: PORTABLE CHEST - 1 VIEW  COMPARISON:  03/11/2014, 1151 hr  FINDINGS: Support apparatus: Endotracheal tube tip is 41 mm from the carina. There is a LEFT IJ central line with the tip difficult to visualize. The furthest visible tip is in the LEFT brachiocephalic vein. Monitoring leads project over the chest.  Cardiomediastinal Silhouette:  Unchanged.   Lungs: Basilar atelectasis and lower lung volumes.  No pneumothorax.  Effusions:  None.  Other:  None.  IMPRESSION: 1. Endotracheal tube 4 cm from the carina. 2. LEFT IJ central line. The tip appears to be in the superior LEFT brachiocephalic vein. This could also be within the distal internal jugular vein. 3. Low volume chest.   Electronically Signed   By: Andreas NewportGeoffrey  Lamke M.D.   On: 03/21/2014 17:21   Dg Abd Acute W/chest  03/11/2014   CLINICAL DATA:  58 year old male with abdominal pain in all quadrants, gastrointestinal bleeding. Blood in stool for 4 days with nausea and vomiting. Initial encounter.  EXAM: ACUTE ABDOMEN SERIES (ABDOMEN 2 VIEW & CHEST 1 VIEW)  COMPARISON:  Chest CT 01/08/2014 and earlier. CT Abdomen and Pelvis 01/06/2014.  FINDINGS: AP upright view of the chest. Stable lung volumes. The lungs are clear. Stable left clavicle ORIF. Normal cardiac size and mediastinal contours. No pneumothorax or pneumoperitoneum identified.  Gasless abdomen. Wallace CullensGray hazy opacity throughout the abdomen suggestive of ascites which was present on 01/06/2014. No acute osseous abnormality identified.  IMPRESSION: 1. No free air. Gasless abdomen with gray hazy opacity throughout suggesting ascites. See also CT Abdomen and Pelvis 01/06/2014 2.  No acute cardiopulmonary abnormality.   Electronically Signed  By: Augusto GambleLee  Hall M.D.   On: 03/23/2014 12:24    Assessment: 58 year old male with history of ETOH/HCV cirrhosis, portal vein thrombosis on Xarelto as outpatient, presenting this admission with hematemesis and melena, acute blood loss anemia secondary to large variceal bleed. Procedure at bedside on 11/3 with 5 columns of large varices, banding X 4. 1 varix with evidence of white nipple sign. Required intubation and pressor support. MELD 29 and Child Pugh C. Prognosis remains guarded but clinically stable since procedure without any further evidence of overt GI bleeding. Hgb 7.4. Received total of 4 units PRBCs and 2  FFP.    Plan: Octreotide infusion for total of 72 hours (first dose 11/3 at 1500) Vit K 10 mg IV daily for total of 3 days (last dose 11/6) PPI BID Remain intubated, sedated Levaquin X 7 days (first dose 11/3)  TIPS if evidence of recurrent bleeding. Needs repeat EGD in 2-3 weeks if remains stable Monitor Hgb Recheck LFTs, BMP, INR now  Nira RetortAnna W. Sams, ANP-BC [facility]     LOS: 1 day    02/26/2014, 7:41 AM  Attending note:  Nephrology and pulmonary consultations reviewed. Systolic pressures maintained in the 90-100 range today. Clinically, no further significant bleeding in almost 24 hours.  Would leave him intubated another 24 hours mainly for airway protection. If no further bleeding by tomorrow morning, I would recommend moving towards weaning. Also, discussed with nursing staff, recommend we try to back off on the levophed.  Addendum at 0900: spoke with Tobi BastosAnna, RN at bedside. Serial H/H was checked just after I had seen patient this morning and is processing. 2 units PRBCs to be on hold in blood bank in case of need for transfusion.

## 2014-02-26 NOTE — Plan of Care (Signed)
Problem: Phase I Progression Outcomes Goal: VTE prophylaxis Outcome: Progressing Goal: GIProphysixis Outcome: Progressing Goal: Oral Care per Protocol Outcome: Progressing Goal: HOB elevated 30 degrees Outcome: Progressing Goal: VAP prevention protocol initiated Outcome: Progressing Goal: Sedation Protocol initiated if indicated Outcome: Progressing Goal: Pneumonia/flu vaccination screen completed Outcome: Progressing Goal: Initiate hyperglycemia protocol as indicated Outcome: Progressing Goal: ARDS Protocol initiated if indicated Outcome: Progressing Goal: Sepsis Protocol initiated if indicated Outcome: Not Applicable Date Met:  48/32/34 Goal: Code status addressed with pt/family Outcome: Completed/Met Date Met:  02/26/14 Goal: Pain controlled with appropriate interventions Outcome: Progressing Goal: Hemodynamically stable Outcome: Progressing Goal: Baseline oxygen/pH stable Outcome: Progressing Goal: Patient tolerating nututrition at goal Outcome: Not Applicable Date Met:  68/87/37 Goal: Progressing towards optiumm acitivities Outcome: Not Applicable Date Met:  30/81/68 Goal: Patient tolerating weaning plan Outcome: Not Applicable Date Met:  38/70/65 Goal: Tracheostomy by Vent Day 14 Outcome: Not Applicable Date Met:  82/60/88 Goal: Optimized method of communication. Outcome: Not Applicable Date Met:  83/58/44 Goal: Initial discharge plan identified Outcome: Progressing Goal: Voiding-avoid urinary catheter unless indicated Outcome: Not Progressing

## 2014-02-26 NOTE — Consult Note (Signed)
NAMAdele Schilder:  Darren Abbott, Darren Abbott               ACCOUNT NO.:  [id number]  MEDICAL RECORD NO.:  [id number]  LOCATION:  [facility]03                          FACILITY:  [facility]  PHYSICIAN:  Phelan Schadt L. Juanetta GoslingHawkins, M.D.DATE OF BIRTH:  [birth year]  DATE OF CONSULTATION: DATE OF DISCHARGE:                                CONSULTATION   Patient of [facility].  REASON FOR CONSULTATION:  Respiratory failure.  HISTORY:  Darren Abbott is a 58 year old who has multiple medical problems, came to the emergency department with hematemesis.  He underwent EGD and was intubated for airway protection.  In addition to that, he has a history of paroxysmal atrial fibrillation, previous CVA, alcoholic cirrhosis, hepatitis C, portal vein thrombosis, and previous history of GI bleeding.  When he was seen in the emergency department, he was noted to be hypotensive and his hemoglobin was low.  He is intubated, so I cannot do review of systems and history is obtained from the medical record.  PAST MEDICAL HISTORY:  York SpanielSaid to be positive for: 1. Malnutrition. 2. Altered mental status. 3. Hypertension. 4. TIA in the past. 5. Previous stroke in the past. 6. Heart murmur. 7. Obstructive sleep apnea. 8. GERD. 9. History of seizure disorder. 10.Old myocardial infarction. 11.Previous history of asthma. 12.Borderline diabetes. 13.Viral hepatitis C with hepatic coma. 14.Alcoholic hepatitis. 15.Arthritis in multiple joints. 16.Chronic lower back pain. 17.Chronic kidney disease. 18.Cirrhosis. 19.Esophageal varices. 20.Pulmonary nodules.  PAST SURGICAL HISTORY:  Surgically, he has had surgery on his left clavicle, surgery on his hand, nasal septal surgery, rhinoplasty, right hand surgery, EGD in the past, and paracentesis multiple times.  SOCIAL HISTORY:  He has a smoking history, but I am not sure how much. It is listed as being 11 pack year.  FAMILY HISTORY:  Not known to be positive for any sort of GI problems.  MEDICATIONS:   At home, he has been taking: 1. Folic acid 1 mg daily. 2. Lasix 20 mg daily. 3. Lactulose 20 g b.i.d. 4. Metoprolol 50 mg one-half tablet daily. 5. Multiple vitamins daily. 6. Zofran 4 mg every 8 hours as needed for nausea. 7. Serax 10 mg b.i.d. 8. Oxycodone 5 mg every 4 hours as needed for pain. 9. Protonix 40 mg b.i.d. 10.Xarelto 15 mg daily. 11.Aldactone 50 mg daily.  PHYSICAL EXAMINATION:  GENERAL:  He is intubated and on the ventilator. He is still requiring pressor support.  He appears to be mildly jaundiced. HEENT:  His pupils react.  His nose and throat are clear. NECK:  Supple. CHEST:  Rhonchi bilaterally. HEART:  Regular now. ABDOMEN:  Fairly soft. EXTREMITIES:  No edema. CENTRAL NERVOUS SYSTEM:  Hard to assess because of his sedation.  ASSESSMENT:  He is still not hemodynamically stable.  He is on the ventilator for airway protection.  He has required more blood and considering his need for pressors, the need for blood and fluid resuscitation and his acute on chronic renal failure, I do not think he is ready for weaning/extubation at this time.  I will plan to follow with you.  Thank you for allowing me to see him with you.     Gitty Osterlund L. Juanetta GoslingHawkins, M.D.  ELH/MEDQ  D:  02/26/2014  T:  02/26/2014  Job:  [id number]

## 2014-02-26 NOTE — Progress Notes (Signed)
PO meds held  due to the fact that patient has no OGT/NGT. Consulted with Gerrit HallsAnna Sams NP, who said not to place due to bleeding varices.

## 2014-02-26 NOTE — Progress Notes (Signed)
eLink Physician-Brief Progress Note Patient Name: Darren Abbott E Sawtelle DOB: 04/21/1956 MRN: [id number]   Date of Service  02/26/2014  HPI/Events of Note  Hypotensive on propofol gtt  eICU Interventions  Prefer to use fent gtt & minimise propofol Goal RASS 0 to -1, PAD protocol activated     Intervention Category Major Interventions: Respiratory failure - evaluation and management  Yael Coppess V. 02/26/2014, 5:02 PM

## 2014-02-26 NOTE — Consult Note (Signed)
Darren Abbott MRN: [id number] DOB/AGE: 58/10/1955 58 y.o. Primary Care Physician:ROBERSON, Foye ClockKRISTINA, MD Admit date: 03/15/2014 Chief Complaint:  Chief Complaint  Patient presents with  . GI Bleeding   HPI: Pt is  58 year old male with past medical hx of Cirrhosis who presented to er with c/o blood in emesis.  HPI dates back to few days when started having blood in emesis. Pt came to Er on 03/23/2014 and was found to be hypotensive and anemic with hgb of 7.0. Pt later underwent EGD and was found to have variceal bleed. Pt was also intubated for airway protection. Pt continue to be intubated. Pt unable to offers any complaints. Pt father present in the room ,vioces no new concerns.   Past Medical History  Diagnosis Date  . Malnutrition of moderate degree   . Altered mental status   . Unspecified essential hypertension   . Anxiety state, unspecified   . Transient ischemic attack (TIA), and cerebral infarction without residual deficits(V12.54)   . Body mass index between 19-24, adult   . Atrial fibrillation     episode at [facility].  [facility]. Heart murmur   . Obstructive sleep apnea (adult) (pediatric)     to start back after surgery- not used for 2-313months  . GERD (gastroesophageal reflux disease)   . Unspecified epilepsy without mention of intractable epilepsy 13    only once  . Hand laceration 80    end of all fingers on rt hand  . Old myocardial infarction   . Asthma     "outgrew it"  . Borderline diabetes   . 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 Acute alcoholic hepatitis     possible hepatic encephalopathy May 10, 2011  . Daily headache   . Migraine     "daily" (08/30/2013)  . Stroke ~ 2000    "mild"  . Arthritis     "I'm ate up w/it" (08/30/2013)  . Chronic lower back pain   . On home oxygen therapy     "2L prn" (08/30/2013)  . Chronic kidney disease     "one isn't functioning like it should" (08/30/2013)  . Cirrhosis   . Esophageal  varices   . Thrombus     chronic,junction of splenic portal vein  . Pulmonary nodules     chronic an new 2015  . Cataract        Family History  Problem Relation Age of Onset  . Colon cancer Neg Hx   . Cancer Mother     Social History:  reports that he has been smoking Cigarettes.  He has a 11 pack-year smoking history. He has never used smokeless tobacco. He reports that he drinks alcohol. He reports that he does not use illicit drugs.   Allergies: No Known Allergies  Medications Prior to Admission  Medication Sig Dispense Refill  . folic acid (FOLVITE) 1 MG tablet Take 1 tablet (1 mg total) by mouth daily. 30 tablet 1  . furosemide (LASIX) 20 MG tablet Take 1 tablet (20 mg total) by mouth daily. 30 tablet 3  . Lactulose 20 GM/30ML SOLN Take 30 mLs (20 g total) by mouth 2 (two) times daily. 240 mL 3  . metoprolol (LOPRESSOR) 50 MG tablet Take 0.5 tablets (25 mg total) by mouth daily. 60 tablet 11  . Multiple Vitamins-Minerals (CENTRUM SILVER PO) Take 1 tablet by mouth daily.    . ondansetron (ZOFRAN) 4 MG tablet Take 4  mg by mouth every 8 (eight) hours as needed for nausea or vomiting.    Marland Kitchen. oxazepam (SERAX) 10 MG capsule Take 10 mg by mouth 2 (two) times daily.    Marland Kitchen. oxyCODONE (OXY IR/ROXICODONE) 5 MG immediate release tablet Take 1 tablet (5 mg total) by mouth every 4 (four) hours as needed for severe pain. 100 tablet 0  . pantoprazole (PROTONIX) 40 MG tablet Take 1 tablet (40 mg total) by mouth 2 (two) times daily before a meal. 30 tablet 1  . Rivaroxaban (XARELTO) 15 MG TABS tablet Take 1 tablet (15 mg total) by mouth daily with breakfast. 30 tablet 1  . spironolactone (ALDACTONE) 50 MG tablet Take 1 tablet (50 mg total) by mouth daily. 30 tablet 3    ROS-Unable to get as pt is intubated.   Meds as inpt  . antiseptic oral rinse  7 mL Mouth Rinse QID  . chlorhexidine  15 mL Mouth Rinse BID  . folic acid  1 mg Oral Daily  . lactulose  20 g Oral BID  . levofloxacin  (LEVAQUIN) IV  750 mg Intravenous Q48H  . pantoprazole (PROTONIX) IV  80 mg Intravenous Q12H  . phytonadione (VITAMIN K) IV  10 mg Intravenous Q24H  . sodium chloride  3 mL Intravenous Q12H     Physical Exam: Vital signs in last 24 hours: Temp:  [97.1 F (36.2 C)-98.5 F (36.9 C)] 97.9 F (36.6 C) (11/04 0836) Pulse Rate:  [25-145] 77 (11/04 0900) Resp:  [14-35] 14 (11/04 0900) BP: (63-160)/(40-137) 95/58 mmHg (11/04 0900) SpO2:  [81 %-100 %] 100 % (11/04 0900) FiO2 (%):  [40 %] 40 % (11/04 0700) Weight:  [146 lb 13.2 oz (66.6 kg)-160 lb 15 oz (73 kg)] 160 lb 15 oz (73 kg) (11/04 0100) Weight change:  Last BM Date: 02/26/14  Intake/Output from previous day: 11/03 0701 - 11/04 0700 In: 7299.5 [I.V.:4956.7; Blood:1992.8; IV Piggyback:350] Out: 440 [Urine:440] Total I/O In: 174.4 [I.V.:174.4] Out: 30 [Urine:30]   Physical Exam: General- pt is intubated,sedated Resp- No acute REsp distress,, ET tube in situ +B/L  Rhonchi CVS- S1S2 regular ij rate and rhythm GIT- BS+, soft, NT, ND EXT- NO LE Edema, Cyanosis     Lab Results: CBC  Recent Labs  07-08-13 1040  07-08-13 2310 02/26/14 0828  WBC 12.1*  --   --   --   HGB 7.0*  < > 7.4* 9.6*  HCT 21.3*  < > 21.5* 27.9*  PLT 94*  --   --   --   < > = values in this interval not displayed.  BMET  Recent Labs  07-08-13 1040 07-08-13 1439 02/26/14 0828  NA 142  --  139  K 5.6* 5.0 4.4  CL 103  --  108  CO2 16*  --  23  GLUCOSE 90  --  114*  BUN 46*  --  59*  CREATININE 1.87*  --  2.58*  CALCIUM 8.0*  --  6.9*   Creat Trend 2015 0.59=>1.87=>2.58  Output Less than 400ml    MICRO Recent Results (from the past 240 hour(s))  MRSA PCR Screening     Status: None   Collection Time: 07-08-13  2:15 PM  Result Value Ref Range Status   MRSA by PCR NEGATIVE NEGATIVE Final    Comment:        The GeneXpert MRSA Assay (FDA approved for NASAL specimens only), is one component of a comprehensive MRSA  colonization surveillance program. It is  not intended to diagnose MRSA infection nor to guide or monitor treatment for MRSA infections.       Lab Results  Component Value Date   CALCIUM 6.9* 02/26/2014      Impression: 1)Renal  AKI secondary to ATN                Oliguric ATN sec to Hemorrhagic shock                                  Hypotension/anemia / Sprinololactone                AKi worsening                No need of Hd yet  2)CVs hemodynamically unstable On vasopressors  3)Anemia HGb at goal (9--11) S/p PRBC   4)CKD Mineral-Bone Disorder- Not indicated to check in AKI PTH  Secondary Hyperparathyroidism Phosphorus   5)GI- admitted with Variceal bleed GI and Primary MD following  6)Electrlytes  Hyperkalemic        AKI + Spironolactone        Now better  NOrmonatremic   7)Acid base Co2 at goal Was low earlier  8) Resp Intubated.   Plan:  Will suggest to increase IVF Will add lasix in am  ( if bp better) Will ask for FENA will ask for renal u/s Will follow closley     Haruo Stepanek S 02/26/2014, 10:13 AM

## 2014-02-27 ENCOUNTER — Inpatient Hospital Stay (HOSPITAL_COMMUNITY): Payer: Medicaid Other

## 2014-02-27 ENCOUNTER — Encounter (HOSPITAL_COMMUNITY): Payer: Self-pay | Admitting: Gastroenterology

## 2014-02-27 DIAGNOSIS — K7460 Unspecified cirrhosis of liver: Secondary | ICD-10-CM

## 2014-02-27 DIAGNOSIS — I8500 Esophageal varices without bleeding: Secondary | ICD-10-CM

## 2014-02-27 DIAGNOSIS — R579 Shock, unspecified: Secondary | ICD-10-CM

## 2014-02-27 LAB — COMPREHENSIVE METABOLIC PANEL
ALT: 98 U/L — ABNORMAL HIGH (ref 0–53)
ANION GAP: 8 (ref 5–15)
AST: 231 U/L — ABNORMAL HIGH (ref 0–37)
Albumin: 2 g/dL — ABNORMAL LOW (ref 3.5–5.2)
Alkaline Phosphatase: 75 U/L (ref 39–117)
BILIRUBIN TOTAL: 4.5 mg/dL — AB (ref 0.3–1.2)
BUN: 59 mg/dL — AB (ref 6–23)
CO2: 22 meq/L (ref 19–32)
CREATININE: 2.6 mg/dL — AB (ref 0.50–1.35)
Calcium: 7.2 mg/dL — ABNORMAL LOW (ref 8.4–10.5)
Chloride: 107 mEq/L (ref 96–112)
GFR calc Af Amer: 30 mL/min — ABNORMAL LOW (ref >90)
GFR, EST NON AFRICAN AMERICAN: 26 mL/min — AB (ref >90)
GLUCOSE: 90 mg/dL (ref 70–99)
Potassium: 4.3 mEq/L (ref 3.7–5.3)
Sodium: 137 mEq/L (ref 137–147)
Total Protein: 4.8 g/dL — ABNORMAL LOW (ref 6.0–8.3)

## 2014-02-27 LAB — HEMOGLOBIN AND HEMATOCRIT, BLOOD
HCT: 31.6 % — ABNORMAL LOW (ref 39.0–52.0)
HCT: 30.3 % — ABNORMAL LOW (ref 39.0–52.0)
HCT: 29 % — ABNORMAL LOW (ref 39.0–52.0)
HEMOGLOBIN: 10.8 g/dL — AB (ref 13.0–17.0)
Hemoglobin: 10.2 g/dL — ABNORMAL LOW (ref 13.0–17.0)
Hemoglobin: 9.7 g/dL — ABNORMAL LOW (ref 13.0–17.0)

## 2014-02-27 LAB — CBC
HEMATOCRIT: 30.3 % — AB (ref 39.0–52.0)
HEMOGLOBIN: 10.4 g/dL — AB (ref 13.0–17.0)
MCH: 29.9 pg (ref 26.0–34.0)
MCHC: 34.3 g/dL (ref 30.0–36.0)
MCV: 87.1 fL (ref 78.0–100.0)
Platelets: DECREASED 10*3/uL (ref 150–400)
RBC: 3.48 MIL/uL — AB (ref 4.22–5.81)
RDW: 16.8 % — ABNORMAL HIGH (ref 11.5–15.5)
WBC: 9.6 10*3/uL (ref 4.0–10.5)

## 2014-02-27 LAB — SODIUM, URINE, RANDOM: Sodium, Ur: <20 mEq/L

## 2014-02-27 LAB — CREATININE, URINE, RANDOM: Creatinine, Urine: 221.57 mg/dL

## 2014-02-27 NOTE — Progress Notes (Signed)
Subjective: He has had some changes made overnight and is off propofol and now on fentanyl. He remains on levo fed. His blood pressure is still marginal in the low to mid 90s on levo fed. He remains intubated and on the ventilator  Objective: Vital signs in last 24 hours: Temp:  [96.8 F (36 C)-98.9 F (37.2 C)] 98.2 F (36.8 C) (11/05 0820) Pulse Rate:  [72-106] 82 (11/05 0830) Resp:  [13-17] 16 (11/05 0830) BP: (89-126)/(49-90) 93/56 mmHg (11/05 0830) SpO2:  [97 %-100 %] 100 % (11/05 0830) FiO2 (%):  [40 %] 40 % (11/05 0733) Weight:  [73 kg (160 lb 15 oz)] 73 kg (160 lb 15 oz) (11/05 0500) Weight change: 6.4 kg (14 lb 1.8 oz) Last BM Date: 02/26/14  Intake/Output from previous day: 11/04 0701 - 11/05 0700 In: 3299.7 [I.V.:3099.7; IV Piggyback:200] Out: 790 [Urine:790]  PHYSICAL EXAM General appearance: intubated sedated on the ventilator and jaundiced Resp: rhonchi bilaterally Cardio: regular rate and rhythm, S1, S2 normal, no murmur, click, rub or gallop GI: soft, non-tender; bowel sounds normal; no masses,  no organomegaly Extremities: extremities normal, atraumatic, no cyanosis or edema  Lab Results:  Results for orders placed or performed during the hospital encounter of 03/04/2014 (from the past 48 hour(s))  ABO/Rh     Status: None   Collection Time: 03/19/2014 10:37 AM  Result Value Ref Range   ABO/RH(D) B POS   CBC with Differential     Status: Abnormal   Collection Time: 03/14/2014 10:40 AM  Result Value Ref Range   WBC 12.1 (H) 4.0 - 10.5 K/uL   RBC 2.25 (L) 4.22 - 5.81 MIL/uL   Hemoglobin 7.0 (L) 13.0 - 17.0 g/dL   HCT 21.3 (L) 39.0 - 52.0 %   MCV 94.7 78.0 - 100.0 fL   MCH 31.1 26.0 - 34.0 pg   MCHC 32.9 30.0 - 36.0 g/dL   RDW 15.7 (H) 11.5 - 15.5 %   Platelets 94 (L) 150 - 400 K/uL    Comment: SPECIMEN CHECKED FOR CLOTS PLATELET COUNT CONFIRMED BY SMEAR    Neutrophils Relative % 81 (H) 43 - 77 %   Neutro Abs 9.8 (H) 1.7 - 7.7 K/uL   Lymphocytes Relative  11 (L) 12 - 46 %   Lymphs Abs 1.3 0.7 - 4.0 K/uL   Monocytes Relative 8 3 - 12 %   Monocytes Absolute 0.9 0.1 - 1.0 K/uL   Eosinophils Relative 0 0 - 5 %   Eosinophils Absolute 0.0 0.0 - 0.7 K/uL   Basophils Relative 0 0 - 1 %   Basophils Absolute 0.0 0.0 - 0.1 K/uL   Smear Review PLATELETS APPEAR DECREASED   Comprehensive metabolic panel     Status: Abnormal   Collection Time: 03/24/2014 10:40 AM  Result Value Ref Range   Sodium 142 137 - 147 mEq/L   Potassium 5.6 (H) 3.7 - 5.3 mEq/L   Chloride 103 96 - 112 mEq/L   CO2 16 (L) 19 - 32 mEq/L   Glucose, Bld 90 70 - 99 mg/dL   BUN 46 (H) 6 - 23 mg/dL   Creatinine, Ser 1.87 (H) 0.50 - 1.35 mg/dL   Calcium 8.0 (L) 8.4 - 10.5 mg/dL   Total Protein 4.9 (L) 6.0 - 8.3 g/dL   Albumin 1.9 (L) 3.5 - 5.2 g/dL   AST 206 (H) 0 - 37 U/L   ALT 71 (H) 0 - 53 U/L   Alkaline Phosphatase 82 39 - 117 U/L  Total Bilirubin 4.3 (H) 0.3 - 1.2 mg/dL   GFR calc non Af Amer 38 (L) >90 mL/min   GFR calc Af Amer 44 (L) >90 mL/min    Comment: (NOTE) The eGFR has been calculated using the CKD EPI equation. This calculation has not been validated in all clinical situations. eGFR's persistently <90 mL/min signify possible Chronic Kidney Disease.   Lipase, blood     Status: None   Collection Time: 03/05/2014 10:40 AM  Result Value Ref Range   Lipase 44 11 - 59 U/L  Ammonia     Status: None   Collection Time: 03/19/2014 10:40 AM  Result Value Ref Range   Ammonia 45 11 - 60 umol/L  Prepare RBC     Status: None   Collection Time: 03/11/2014 10:40 AM  Result Value Ref Range   Order Confirmation ORDER PROCESSED BY BLOOD BANK   POC occult blood, ED Provider will collect     Status: Abnormal   Collection Time: 03/23/2014 11:07 AM  Result Value Ref Range   Fecal Occult Bld POSITIVE (A) NEGATIVE  Type and screen for Red Blood Exchange     Status: None (Preliminary result)   Collection Time: 02/26/2014 12:52 PM  Result Value Ref Range   ABO/RH(D) B POS    Antibody  Screen NEG    Sample Expiration 02/28/2014    Unit Number [id number]    Blood Component Type RED CELLS,LR    Unit division 00    Status of Unit ISSUED,FINAL    Transfusion Status OK TO TRANSFUSE    Crossmatch Result Compatible    Unit Number [id number]    Blood Component Type RED CELLS,LR    Unit division 00    Status of Unit ISSUED,FINAL    Transfusion Status OK TO TRANSFUSE    Crossmatch Result Compatible    Unit Number [id number]    Blood Component Type RED CELLS,LR    Unit division 00    Status of Unit ISSUED,FINAL    Transfusion Status OK TO TRANSFUSE    Crossmatch Result Compatible    Unit Number [id number]    Blood Component Type RED CELLS,LR    Unit division 00    Status of Unit ISSUED,FINAL    Transfusion Status OK TO TRANSFUSE    Crossmatch Result Compatible    Unit Number [id number]    Blood Component Type RED CELLS,LR    Unit division 00    Status of Unit ALLOCATED    Transfusion Status OK TO TRANSFUSE    Crossmatch Result Compatible    Unit Number [id number]    Blood Component Type RED CELLS,LR    Unit division 00    Status of Unit ALLOCATED    Transfusion Status OK TO TRANSFUSE    Crossmatch Result Compatible   Prepare fresh frozen plasma     Status: None   Collection Time: 03/17/2014 12:52 PM  Result Value Ref Range   Unit Number [id number]    Blood Component Type THAWED PLASMA    Unit division 00    Status of Unit ISSUED,FINAL    Transfusion Status OK TO TRANSFUSE    Unit Number [id number]    Blood Component Type THAWED PLASMA    Unit division 00    Status of Unit ISSUED,FINAL    Transfusion Status OK TO TRANSFUSE   Prepare RBC (crossmatch)     Status: None   Collection Time: 03/17/2014 12:52 PM  Result Value Ref Range   Order Confirmation ORDER PROCESSED  BY BLOOD BANK   Prepare RBC     Status: None   Collection Time: 03/06/2014 12:52 PM  Result Value Ref Range   Order Confirmation ORDER PROCESSED BY BLOOD BANK   MRSA PCR  Screening     Status: None   Collection Time: 03/12/2014  2:15 PM  Result Value Ref Range   MRSA by PCR NEGATIVE NEGATIVE    Comment:        The GeneXpert MRSA Assay (FDA approved for NASAL specimens only), is one component of a comprehensive MRSA colonization surveillance program. It is not intended to diagnose MRSA infection nor to guide or monitor treatment for MRSA infections.   Protime-INR     Status: Abnormal   Collection Time: 03/06/2014  2:39 PM  Result Value Ref Range   Prothrombin Time 28.7 (H) 11.6 - 15.2 seconds   INR 2.68 (H) 0.00 - 1.49  Potassium     Status: None   Collection Time: 03/07/2014  2:39 PM  Result Value Ref Range   Potassium 5.0 3.7 - 5.3 mEq/L  Hemoglobin and hematocrit, blood     Status: Abnormal   Collection Time: 03/24/2014  2:39 PM  Result Value Ref Range   Hemoglobin 6.0 (LL) 13.0 - 17.0 g/dL    Comment: CRITICAL RESULT CALLED TO, READ BACK BY AND VERIFIED WITH: PHILLIPS,C. AT 1620 ON 03/13/2014 BY BAUGHAM,M.    HCT 17.9 (L) 39.0 - 52.0 %  Ethanol     Status: None   Collection Time: 03/23/2014  2:52 PM  Result Value Ref Range   Alcohol, Ethyl (B) <11 0 - 11 mg/dL    Comment:        LOWEST DETECTABLE LIMIT FOR SERUM ALCOHOL IS 11 mg/dL FOR MEDICAL PURPOSES ONLY   Draw ABG 1 hour after initiation of ventilator     Status: Abnormal   Collection Time: 03/11/2014  6:18 PM  Result Value Ref Range   FIO2 0.40 %   Delivery systems VENTILATOR    Mode PRESSURE REGULATED VOLUME CONTROL    VT 550 mL   Rate 14 resp/min   Peep/cpap 5.0 cm H20   pH, Arterial 7.348 (L) 7.350 - 7.450   pCO2 arterial 31.1 (L) 35.0 - 45.0 mmHg   pO2, Arterial 157.0 (H) 80.0 - 100.0 mmHg   Bicarbonate 16.6 (L) 20.0 - 24.0 mEq/L   TCO2 16.2 0 - 100 mmol/L   Acid-base deficit 7.9 (H) 0.0 - 2.0 mmol/L   O2 Saturation 99.1 %   Patient temperature 37.0    Collection site RIGHT BRACHIAL    Drawn by [phone]    Sample type ARTERIAL DRAW   Hemoglobin and hematocrit, blood      Status: Abnormal   Collection Time: 03/13/2014 11:10 PM  Result Value Ref Range   Hemoglobin 7.4 (L) 13.0 - 17.0 g/dL    Comment: DELTA CHECK NOTED POST TRANSFUSION SPECIMEN    HCT 21.5 (L) 39.0 - 07.3 %  Basic metabolic panel     Status: Abnormal   Collection Time: 02/26/14  8:28 AM  Result Value Ref Range   Sodium 139 137 - 147 mEq/L   Potassium 4.4 3.7 - 5.3 mEq/L   Chloride 108 96 - 112 mEq/L   CO2 23 19 - 32 mEq/L   Glucose, Bld 114 (H) 70 - 99 mg/dL   BUN 59 (H) 6 - 23 mg/dL   Creatinine, Ser 2.58 (H) 0.50 - 1.35 mg/dL   Calcium 6.9 (L) 8.4 - 10.5 mg/dL  GFR calc non Af Amer 26 (L) >90 mL/min   GFR calc Af Amer 30 (L) >90 mL/min    Comment: (NOTE) The eGFR has been calculated using the CKD EPI equation. This calculation has not been validated in all clinical situations. eGFR's persistently <90 mL/min signify possible Chronic Kidney Disease.    Anion gap 8 5 - 15  Hepatic function panel     Status: Abnormal   Collection Time: 02/26/14  8:28 AM  Result Value Ref Range   Total Protein 4.5 (L) 6.0 - 8.3 g/dL   Albumin 1.9 (L) 3.5 - 5.2 g/dL   AST 186 (H) 0 - 37 U/L   ALT 72 (H) 0 - 53 U/L   Alkaline Phosphatase 80 39 - 117 U/L   Total Bilirubin 4.5 (H) 0.3 - 1.2 mg/dL   Bilirubin, Direct 2.7 (H) 0.0 - 0.3 mg/dL   Indirect Bilirubin 1.8 (H) 0.3 - 0.9 mg/dL  Protime-INR     Status: Abnormal   Collection Time: 02/26/14  8:28 AM  Result Value Ref Range   Prothrombin Time 21.2 (H) 11.6 - 15.2 seconds   INR 1.82 (H) 0.00 - 1.49  Hemoglobin and hematocrit, blood     Status: Abnormal   Collection Time: 02/26/14  8:28 AM  Result Value Ref Range   Hemoglobin 9.6 (L) 13.0 - 17.0 g/dL    Comment: DELTA CHECK NOTED   HCT 27.9 (L) 39.0 - 52.0 %  Hemoglobin and hematocrit, blood     Status: Abnormal   Collection Time: 02/26/14  1:57 PM  Result Value Ref Range   Hemoglobin 10.0 (L) 13.0 - 17.0 g/dL   HCT 29.0 (L) 39.0 - 52.0 %  Hemoglobin and hematocrit, blood     Status:  Abnormal   Collection Time: 02/26/14  7:38 PM  Result Value Ref Range   Hemoglobin 9.9 (L) 13.0 - 17.0 g/dL   HCT 28.6 (L) 39.0 - 52.0 %  CBC     Status: Abnormal   Collection Time: 02/27/14  1:59 AM  Result Value Ref Range   WBC 9.6 4.0 - 10.5 K/uL   RBC 3.48 (L) 4.22 - 5.81 MIL/uL   Hemoglobin 10.4 (L) 13.0 - 17.0 g/dL   HCT 30.3 (L) 39.0 - 52.0 %   MCV 87.1 78.0 - 100.0 fL    Comment: DELTA CHECK NOTED   MCH 29.9 26.0 - 34.0 pg   MCHC 34.3 30.0 - 36.0 g/dL   RDW 16.8 (H) 11.5 - 15.5 %   Platelets  150 - 400 K/uL    PLATELET CLUMPS NOTED ON SMEAR, COUNT APPEARS DECREASED    Comment: DELTA CHECK NOTED SPECIMEN CHECKED FOR CLOTS SMEAR STAINED AND AVAILABLE FOR REVIEW   Comprehensive metabolic panel     Status: Abnormal   Collection Time: 02/27/14  1:59 AM  Result Value Ref Range   Sodium 137 137 - 147 mEq/L   Potassium 4.3 3.7 - 5.3 mEq/L   Chloride 107 96 - 112 mEq/L   CO2 22 19 - 32 mEq/L   Glucose, Bld 90 70 - 99 mg/dL   BUN 59 (H) 6 - 23 mg/dL   Creatinine, Ser 2.60 (H) 0.50 - 1.35 mg/dL   Calcium 7.2 (L) 8.4 - 10.5 mg/dL   Total Protein 4.8 (L) 6.0 - 8.3 g/dL   Albumin 2.0 (L) 3.5 - 5.2 g/dL   AST 231 (H) 0 - 37 U/L   ALT 98 (H) 0 - 53 U/L   Alkaline Phosphatase  75 39 - 117 U/L   Total Bilirubin 4.5 (H) 0.3 - 1.2 mg/dL   GFR calc non Af Amer 26 (L) >90 mL/min   GFR calc Af Amer 30 (L) >90 mL/min    Comment: (NOTE) The eGFR has been calculated using the CKD EPI equation. This calculation has not been validated in all clinical situations. eGFR's persistently <90 mL/min signify possible Chronic Kidney Disease.    Anion gap 8 5 - 15  Hemoglobin and hematocrit, blood     Status: Abnormal   Collection Time: 02/27/14  8:13 AM  Result Value Ref Range   Hemoglobin 10.8 (L) 13.0 - 17.0 g/dL   HCT 31.6 (L) 39.0 - 52.0 %    ABGS  Recent Labs  03/12/2014 1818  PHART 7.348*  PO2ART 157.0*  TCO2 16.2  HCO3 16.6*   CULTURES Recent Results (from the past 240  hour(s))  MRSA PCR Screening     Status: None   Collection Time: 02/27/2014  2:15 PM  Result Value Ref Range Status   MRSA by PCR NEGATIVE NEGATIVE Final    Comment:        The GeneXpert MRSA Assay (FDA approved for NASAL specimens only), is one component of a comprehensive MRSA colonization surveillance program. It is not intended to diagnose MRSA infection nor to guide or monitor treatment for MRSA infections.    Studies/Results: US Renal  02/26/2014   CLINICAL DATA:  Acute tubular necrosis.  EXAM: RENAL/URINARY TRACT ULTRASOUND COMPLETE  COMPARISON:  Ultrasound the abdomen on 01/09/2014  FINDINGS: Right Kidney:  Length: 14.8 cm. No hydronephrosis. No definite shadowing calculi identified. No focal mass.  Left Kidney:  Length: 14.6 cm. No hydronephrosis. No shadowing calculi identified. No focal mass.  Bladder:  Foley catheter decompresses the bladder.  Additional findings:  Note is made of splenomegaly. Splenic volume is 948.1 cubic cm. Ascites is also present.  IMPRESSION: 1. No hydronephrosis or focal renal mass. 2. Splenomegaly. 3. Ascites.   Electronically Signed   By: Shon Hale M.D.   On: 02/26/2014 17:04   Dg Chest Port 1 View  02/27/2014   CLINICAL DATA:  Respiratory failure.  EXAM: PORTABLE CHEST - 1 VIEW  COMPARISON:  02/26/2014 .  FINDINGS: Endotracheal tube and left IJ line in stable position. Mediastinum and hilar structures are unremarkable. Stable bibasilar atelectasis and/or mild infiltrates. No pleural effusion or pneumothorax. Heart size normal. Pulmonary vascularity normal. No pleural effusion or pneumothorax. Prior plate and screw fixation left clavicle.  IMPRESSION: 1. Lines and tubes in stable position. 2. Stable bibasilar atelectasis and/or mild infiltrates.   Electronically Signed   By: Marcello Moores  Register   On: 02/27/2014 07:26   Portable Chest Xray  02/26/2014   CLINICAL DATA:  Acute respiratory failure  EXAM: PORTABLE CHEST - 1 VIEW  COMPARISON:  03/23/2014   FINDINGS: Cardiac shadow is stable. An endotracheal tube is again seen 3.9 cm above the carina. A left central venous line is again noted in the mid portion of the left innominate vein. Lungs are again aerated with patchy bibasilar atelectatic changes. No focal confluent infiltrate is seen. Postsurgical changes in the left clavicle are noted.  IMPRESSION: Overall stable appearance with bibasilar atelectasis.  The left jugular central line does appear to have advanced slightly in the interval from the prior exam   Electronically Signed   By: Inez Catalina M.D.   On: 02/26/2014 08:00   Dg Chest Port 1 View  03/11/2014   CLINICAL DATA:  Post intubation radiograph.  Subsequent encounter.  EXAM: PORTABLE CHEST - 1 VIEW  COMPARISON:  03/11/2014, 1151 hr  FINDINGS: Support apparatus: Endotracheal tube tip is 41 mm from the carina. There is a LEFT IJ central line with the tip difficult to visualize. The furthest visible tip is in the LEFT brachiocephalic vein. Monitoring leads project over the chest.  Cardiomediastinal Silhouette:  Unchanged.  Lungs: Basilar atelectasis and lower lung volumes.  No pneumothorax.  Effusions:  None.  Other:  None.  IMPRESSION: 1. Endotracheal tube 4 cm from the carina. 2. LEFT IJ central line. The tip appears to be in the superior LEFT brachiocephalic vein. This could also be within the distal internal jugular vein. 3. Low volume chest.   Electronically Signed   By: Dereck Ligas M.D.   On: 03/22/2014 17:21   Dg Abd Acute W/chest  03/03/2014   CLINICAL DATA:  58 year old male with abdominal pain in all quadrants, gastrointestinal bleeding. Blood in stool for 4 days with nausea and vomiting. Initial encounter.  EXAM: ACUTE ABDOMEN SERIES (ABDOMEN 2 VIEW & CHEST 1 VIEW)  COMPARISON:  Chest CT 01/08/2014 and earlier. CT Abdomen and Pelvis 01/06/2014.  FINDINGS: AP upright view of the chest. Stable lung volumes. The lungs are clear. Stable left clavicle ORIF. Normal cardiac size and  mediastinal contours. No pneumothorax or pneumoperitoneum identified.  Gasless abdomen. Pearline Cables hazy opacity throughout the abdomen suggestive of ascites which was present on 01/06/2014. No acute osseous abnormality identified.  IMPRESSION: 1. No free air. Gasless abdomen with gray hazy opacity throughout suggesting ascites. See also CT Abdomen and Pelvis 01/06/2014 2.  No acute cardiopulmonary abnormality.   Electronically Signed   By: Lars Pinks M.D.   On: 03/22/2014 12:24    Medications:  Prior to Admission:  Prescriptions prior to admission  Medication Sig Dispense Refill Last Dose  . folic acid (FOLVITE) 1 MG tablet Take 1 tablet (1 mg total) by mouth daily. 30 tablet 1 Past Week at Unknown time  . furosemide (LASIX) 20 MG tablet Take 1 tablet (20 mg total) by mouth daily. 30 tablet 3 Past Week at Unknown time  . Lactulose 20 GM/30ML SOLN Take 30 mLs (20 g total) by mouth 2 (two) times daily. 240 mL 3 Past Week at Unknown time  . metoprolol (LOPRESSOR) 50 MG tablet Take 0.5 tablets (25 mg total) by mouth daily. 60 tablet 11 Past Week at Unknown time  . Multiple Vitamins-Minerals (CENTRUM SILVER PO) Take 1 tablet by mouth daily.   Past Week at Unknown time  . ondansetron (ZOFRAN) 4 MG tablet Take 4 mg by mouth every 8 (eight) hours as needed for nausea or vomiting.   Past Week at Unknown time  . oxazepam (SERAX) 10 MG capsule Take 10 mg by mouth 2 (two) times daily.   Past Week at Unknown time  . oxyCODONE (OXY IR/ROXICODONE) 5 MG immediate release tablet Take 1 tablet (5 mg total) by mouth every 4 (four) hours as needed for severe pain. 100 tablet 0 Past Week at Unknown time  . pantoprazole (PROTONIX) 40 MG tablet Take 1 tablet (40 mg total) by mouth 2 (two) times daily before a meal. 30 tablet 1 Past Week at Unknown time  . Rivaroxaban (XARELTO) 15 MG TABS tablet Take 1 tablet (15 mg total) by mouth daily with breakfast. 30 tablet 1 Past Week at Unknown time  . spironolactone (ALDACTONE) 50 MG  tablet Take 1 tablet (50 mg total) by mouth daily. 30 tablet  3 Past Week at Unknown time   Scheduled: . antiseptic oral rinse  7 mL Mouth Rinse QID  . chlorhexidine  15 mL Mouth Rinse BID  . folic acid  1 mg Oral Daily  . lactulose  20 g Oral BID  . levofloxacin (LEVAQUIN) IV  750 mg Intravenous Q48H  . pantoprazole (PROTONIX) IV  80 mg Intravenous Q12H  . phytonadione (VITAMIN K) IV  10 mg Intravenous Q24H  . sodium chloride  3 mL Intravenous Q12H   Continuous: . sodium chloride 100 mL/hr at 02/27/14 0820  . fentaNYL infusion INTRAVENOUS 100 mcg/hr (02/26/14 2100)  . norepinephrine (LEVOPHED) Adult infusion 14 mcg/min (02/27/14 0300)  . octreotide  (SANDOSTATIN)    IV infusion 50 mcg/hr (02/27/14 0215)  . propofol Stopped (02/26/14 2100)   MBE:[id number], fentaNYL, fentaNYL, LORazepam, morphine injection, sodium chloride  Assesment:he was admitted with upper GI bleeding. He has cirrhosis of the liver. When he was admitted he was in hemorrhagic shock. He has had banding of varices and does not seem to be having any active bleeding now. He is intubated for airway protection but is still requiring pressors. I don't think we can make an effort to take him off the ventilator now until we get his blood pressure better. I discussed the situation with family at bedside. I had been informed by the nursing staff that his family seem to think that he was terminal this admission. I told his family member at bedside that although he is obviously critically ill and his long-term prognosis is very poor I don't know of anything at this point unless he can come off the ventilator it would make him terminal this admission assuming no other complications Principal Problem:   Upper GI bleed Active Problems:   Abdominal pain   Hepatitis C   Esophageal varices   Cirrhosis   GIB (gastrointestinal bleeding)   Hemorrhagic shock   Acute renal failure    Plan:continue ventilator support. We can wean the  pressors as possible and get him to where we can get him off the ventilator if he can come off the pressors    LOS: 2 days   Jossilyn Benda L 02/27/2014, 8:46 AM

## 2014-02-27 NOTE — Progress Notes (Addendum)
Subjective:  Sedated. Propofol switched to Fentanyl. Levophed has not been titrated due to BPs. No report of BM in over 24 hours.   Objective: Vital signs in last 24 hours: Temp:  [96.8 F (36 C)-98.9 F (37.2 C)] 98.9 F (37.2 C) (11/05 0400) Pulse Rate:  [72-106] 106 (11/05 0500) Resp:  [13-17] 13 (11/05 0500) BP: (89-126)/(49-90) 112/90 mmHg (11/05 0500) SpO2:  [97 %-100 %] 97 % (11/05 0500) FiO2 (%):  [40 %] 40 % (11/05 0733) Weight:  [160 lb 15 oz (73 kg)] 160 lb 15 oz (73 kg) (11/05 0500) Last BM Date: 02/26/14 General:   sedated in NAD Head:  Normocephalic and atraumatic. Eyes:  Sclera clear, no icterus.  Chest: CTA bilaterally without rales, rhonchi, crackles.    Heart:  Regular rate and rhythm; no murmurs, clicks, rubs,  or gallops. Abdomen:  Soft, slightly distended with ascites. Hypoactive bowel sounds.   Extremities:  Without clubbing, deformity or lower extremity edema. Neurologic:  sedated Skin:  Intact without significant lesions or rashes. Psych:  sedated.  Intake/Output from previous day: 11/04 0701 - 11/05 0700 In: 3299.7 [I.V.:3099.7; IV Piggyback:200] Out: 790 [Urine:790] Intake/Output this shift:    Lab Results: CBC  Recent Labs  03/14/2014 1040  02/26/14 1357 02/26/14 1938 02/27/14 0159  WBC 12.1*  --   --   --  9.6  HGB 7.0*  < > 10.0* 9.9* 10.4*  HCT 21.3*  < > 29.0* 28.6* 30.3*  MCV 94.7  --   --   --  87.1  PLT 94*  --   --   --  PLATELET CLUMPS NOTED ON SMEAR, COUNT APPEARS DECREASED  < > = values in this interval not displayed. BMET  Recent Labs  03/11/2014 1040 03/14/2014 1439 02/26/14 0828 02/27/14 0159  NA 142  --  139 137  K 5.6* 5.0 4.4 4.3  CL 103  --  108 107  CO2 16*  --  23 22  GLUCOSE 90  --  114* 90  BUN 46*  --  59* 59*  CREATININE 1.87*  --  2.58* 2.60*  CALCIUM 8.0*  --  6.9* 7.2*   LFTs  Recent Labs  03/18/2014 1040 02/26/14 0828 02/27/14 0159  BILITOT 4.3* 4.5* 4.5*  BILIDIR  --  2.7*  --   IBILI  --   1.8*  --   ALKPHOS 82 80 75  AST 206* 186* 231*  ALT 71* 72* 98*  PROT 4.9* 4.5* 4.8*  ALBUMIN 1.9* 1.9* 2.0*    Recent Labs  03/18/2014 1040  LIPASE 44   PT/INR  Recent Labs  03/01/2014 1439 02/26/14 0828  LABPROT 28.7* 21.2*  INR 2.68* 1.82*      Imaging Studies: [place] Renal  02/26/2014   CLINICAL DATA:  Acute tubular necrosis.  EXAM: RENAL/URINARY TRACT ULTRASOUND COMPLETE  COMPARISON:  Ultrasound the abdomen on 01/09/2014  FINDINGS: Right Kidney:  Length: 14.8 cm. No hydronephrosis. No definite shadowing calculi identified. No focal mass.  Left Kidney:  Length: 14.6 cm. No hydronephrosis. No shadowing calculi identified. No focal mass.  Bladder:  Foley catheter decompresses the bladder.  Additional findings:  Note is made of splenomegaly. Splenic volume is 948.1 cubic cm. Ascites is also present.  IMPRESSION: 1. No hydronephrosis or focal renal mass. 2. Splenomegaly. 3. Ascites.   Electronically Signed   By: Rosalie GumsBeth  Pelc M.D.   On: 02/26/2014 17:04     Dg Chest Port 1 View  02/27/2014   CLINICAL DATA:  Respiratory failure.  EXAM: PORTABLE CHEST - 1 VIEW  COMPARISON:  02/26/2014 .  FINDINGS: Endotracheal tube and left IJ line in stable position. Mediastinum and hilar structures are unremarkable. Stable bibasilar atelectasis and/or mild infiltrates. No pleural effusion or pneumothorax. Heart size normal. Pulmonary vascularity normal. No pleural effusion or pneumothorax. Prior plate and screw fixation left clavicle.  IMPRESSION: 1. Lines and tubes in stable position. 2. Stable bibasilar atelectasis and/or mild infiltrates.   Electronically Signed   By: Maisie Fushomas  Register   On: 02/27/2014 07:26     Assessment: 58 year old male with history of ETOH/HCV cirrhosis, portal vein thrombosis on Xarelto as outpatient, presenting this admission with hematemesis and melena, acute blood loss anemia secondary to large variceal bleed. Procedure at bedside on 11/3 with 5 columns of large varices, banding X  4. 1 varix with evidence of white nipple sign. Required intubation and pressor support. MELD 29 and Child Pugh C. Prognosis remains guarded but clinically stable since procedure without any further evidence of overt GI bleeding.   Hgb 10.4. No evidence of active bleeding. Received total of 4 units PRBCs and 2 FFP (none in over 24 hours). Continues with worsening renal function, worsening hepatic function. Condition remains guarded. Appreciate assistance from nephrology.  Plan: 1. Octreotide infusion to be complete on 02/28/14 at 1500. 2. Last dose of vit K tomorrow. 3. Complete 7 days of Levaquin. 4. TIPS if evidence of recurrent bleeding. Needs repeat EGD in 2-3 weeks if remains stable. 5. Looking back through records patient tends to run low end of normal BP, consider weaning Levophed today. Discussed with nursing staff. 6. Patient remains intubated, with plans to wean tomorrow per pulmonary. 7. Will continue to follow closely with you.   LOS: 2 days   Tana CoastLeslie Lewis  02/27/2014, 7:46 AM  Attending note:  Patient seen and examined. Discussed with family members. Clinically, no bleeding in about 48 hours. Hemoglobin is good. Runs relatively low blood pressures baseline. I agree with the increasing fluids per nephrology. Levophed needs to be tapered as feasible;  may be contributing to worsening renal function.

## 2014-02-27 NOTE — Progress Notes (Signed)
Subjective: Interval History: none.  Objective: Vital signs in last 24 hours: Temp:  [96.8 F (36 C)-98.9 F (37.2 C)] 98.2 F (36.8 C) (11/05 0820) Pulse Rate:  [72-106] 82 (11/05 0830) Resp:  [13-17] 16 (11/05 0830) BP: (89-126)/(49-90) 93/56 mmHg (11/05 0830) SpO2:  [97 %-100 %] 100 % (11/05 0830) FiO2 (%):  [40 %] 40 % (11/05 0733) Weight:  [73 kg (160 lb 15 oz)] 73 kg (160 lb 15 oz) (11/05 0500) Weight change: 6.4 kg (14 lb 1.8 oz)  Intake/Output from previous day: 11/04 0701 - 11/05 0700 In: 3299.7 [I.V.:3099.7; IV Piggyback:200] Out: 790 [Urine:790] Intake/Output this shift:    Generally  Patient is intubated and sedate Chest: decrease breath sound bilaterally and has bilateral wheezing Heart RRR no murmur Abdomen: Full but none tender Extremities : No edema  Lab Results:  Recent Labs  03/06/2014 1040  02/27/14 0159 02/27/14 0813  WBC 12.1*  --  9.6  --   HGB 7.0*  < > 10.4* 10.8*  HCT 21.3*  < > 30.3* 31.6*  PLT 94*  --  PLATELET CLUMPS NOTED ON SMEAR, COUNT APPEARS DECREASED  --   < > = values in this interval not displayed. BMET:  Recent Labs  02/26/14 0828 02/27/14 0159  NA 139 137  K 4.4 4.3  CL 108 107  CO2 23 22  GLUCOSE 114* 90  BUN 59* 59*  CREATININE 2.58* 2.60*  CALCIUM 6.9* 7.2*   No results for input(s): PTH in the last 72 hours. Iron Studies: No results for input(s): IRON, TIBC, TRANSFERRIN, FERRITIN in the last 72 hours.  Studies/Results: [place] Renal  02/26/2014   CLINICAL DATA:  Acute tubular necrosis.  EXAM: RENAL/URINARY TRACT ULTRASOUND COMPLETE  COMPARISON:  Ultrasound the abdomen on 01/09/2014  FINDINGS: Right Kidney:  Length: 14.8 cm. No hydronephrosis. No definite shadowing calculi identified. No focal mass.  Left Kidney:  Length: 14.6 cm. No hydronephrosis. No shadowing calculi identified. No focal mass.  Bladder:  Foley catheter decompresses the bladder.  Additional findings:  Note is made of splenomegaly. Splenic volume is 948.1  cubic cm. Ascites is also present.  IMPRESSION: 1. No hydronephrosis or focal renal mass. 2. Splenomegaly. 3. Ascites.   Electronically Signed   By: Rosalie GumsBeth  Moncur M.D.   On: 02/26/2014 17:04   Dg Chest Port 1 View  02/27/2014   CLINICAL DATA:  Respiratory failure.  EXAM: PORTABLE CHEST - 1 VIEW  COMPARISON:  02/26/2014 .  FINDINGS: Endotracheal tube and left IJ line in stable position. Mediastinum and hilar structures are unremarkable. Stable bibasilar atelectasis and/or mild infiltrates. No pleural effusion or pneumothorax. Heart size normal. Pulmonary vascularity normal. No pleural effusion or pneumothorax. Prior plate and screw fixation left clavicle.  IMPRESSION: 1. Lines and tubes in stable position. 2. Stable bibasilar atelectasis and/or mild infiltrates.   Electronically Signed   By: Maisie Fushomas  Register   On: 02/27/2014 07:26   Portable Chest Xray  02/26/2014   CLINICAL DATA:  Acute respiratory failure  EXAM: PORTABLE CHEST - 1 VIEW  COMPARISON:  03/23/2014  FINDINGS: Cardiac shadow is stable. An endotracheal tube is again seen 3.9 cm above the carina. A left central venous line is again noted in the mid portion of the left innominate vein. Lungs are again aerated with patchy bibasilar atelectatic changes. No focal confluent infiltrate is seen. Postsurgical changes in the left clavicle are noted.  IMPRESSION: Overall stable appearance with bibasilar atelectasis.  The left jugular central line does appear to  have advanced slightly in the interval from the prior exam   Electronically Signed   By: Alcide CleverMark  Lukens M.D.   On: 02/26/2014 08:00   Dg Chest Port 1 View  03/22/2014   CLINICAL DATA:  Post intubation radiograph.  Subsequent encounter.  EXAM: PORTABLE CHEST - 1 VIEW  COMPARISON:  2013/07/07, 1151 hr  FINDINGS: Support apparatus: Endotracheal tube tip is 41 mm from the carina. There is a LEFT IJ central line with the tip difficult to visualize. The furthest visible tip is in the LEFT brachiocephalic vein.  Monitoring leads project over the chest.  Cardiomediastinal Silhouette:  Unchanged.  Lungs: Basilar atelectasis and lower lung volumes.  No pneumothorax.  Effusions:  None.  Other:  None.  IMPRESSION: 1. Endotracheal tube 4 cm from the carina. 2. LEFT IJ central line. The tip appears to be in the superior LEFT brachiocephalic vein. This could also be within the distal internal jugular vein. 3. Low volume chest.   Electronically Signed   By: Andreas NewportGeoffrey  Lamke M.D.   On: 2013/07/07 17:21   Dg Abd Acute W/chest  02/23/2014   CLINICAL DATA:  58 year old male with abdominal pain in all quadrants, gastrointestinal bleeding. Blood in stool for 4 days with nausea and vomiting. Initial encounter.  EXAM: ACUTE ABDOMEN SERIES (ABDOMEN 2 VIEW & CHEST 1 VIEW)  COMPARISON:  Chest CT 01/08/2014 and earlier. CT Abdomen and Pelvis 01/06/2014.  FINDINGS: AP upright view of the chest. Stable lung volumes. The lungs are clear. Stable left clavicle ORIF. Normal cardiac size and mediastinal contours. No pneumothorax or pneumoperitoneum identified.  Gasless abdomen. Wallace CullensGray hazy opacity throughout the abdomen suggestive of ascites which was present on 01/06/2014. No acute osseous abnormality identified.  IMPRESSION: 1. No free air. Gasless abdomen with gray hazy opacity throughout suggesting ascites. See also CT Abdomen and Pelvis 01/06/2014 2.  No acute cardiopulmonary abnormality.   Electronically Signed   By: Augusto GambleLee  Hall M.D.   On: 2013/07/07 12:24    I have reviewed the patient's current medications.  Assessment/Plan: Problem#1 AKI: Most likely ATN as patient addmited with hypotension and on Levophed. Patient today is becoming none oliguric but still low urine out put. His renal function is worsening but the rate of increase has slowed down. Problem#2 Hyperkalemia: Corrected taught to be due to Aldactone/renal failure. His potassium has corrected Problem#3 Hypotension: On Levophed . His blood pressure fluctuating but over all  remains low normal  Problem#4 Anemia from GI bleeding Problem#5 Liver cirrhosis. Patient with some ascites but no significant edema and albumin of 2 Problem#6 Hep C positive Problem#7Atrial fibrillation: His heart rate is controlled Problem#8 Metabolic bone disease: His calcium is slightly low but when corrected for albumin it is with in acceptable range Plan: Continue with hydration and increase his ivf to 125 cc/hr Once adequetley hydrate and if his urine out put does not improved we will consider using diuretic Basic metabolic panel in am    LOS: 2 days   Darren Abbott S 02/27/2014,9:07 AM

## 2014-02-27 NOTE — Progress Notes (Signed)
[facility] PROGRESS NOTE  Darren Abbott FAO:[id number] DOB: 11/04/1955 DOA: 03/17/2014 PCP: Darren RadarOBERSON, KRISTINA, MD  Summary:  This is a 58 year old gentleman with known history of alcoholic/hepatitis C cirrhosis and varices presents to the hospital with GI bleeding and abdominal pain. Shortly after admission, he required intubation for airway protection and became hypotensive requiring vasopressors. He was transfused PRBCs and had urgent endoscopy done which showed GI bleed due to esophageal varix.he is currently on a octreotide infusion and is continued on proton pump inhibitors. It is felt that if he rebleeds, he will likely require TIPS procedure. He currently remains on ventilator.  Assessment/Plan: 1. Acute esophageal variceal GI bleeding. Status post endoscopy with results noted as below. He was transfused 2 units of PRBCs. Hemoglobin appears to be stable. Continue octreotide infusion, proton pump inhibitors. Bleeding was further complicated by anticoagulation which is currently on hold. Continue to monitor hemoglobin. Since patient was having significant hematemesis on admission, he did require intubation for airway protection. Appreciate pulmonology assistance with vent management. 2. Acute blood loss anemia. Transfused 2 units PRBCs. Continue to follow hemoglobin. Appears stable at this time. 3. Hemorrhagic shock. Related to #1 and #2. Still requiring levophed to maintain blood pressure. Attempts are being made to wean this off. 4. History of alcoholic/hepatitis C cirrhosis. Continue Levaquin for SBP treatment. He is developing ascites with on going hydration and low albumin state. May need to have paracentesis prior to discharge. 5. History of portal vein thrombosis. Not a candidate for anticoagulation with ongoing bleeding. 6. History of paroxysmal atrial fibrillation. Not a candidate for anticoagulation 7. Coagulopathy related to liver disease, likely also complicated by Xarelto  treatment. Patient is currently receiving vitamin K and INR is improving. 8. Acute renal failure. Appreciate nephrology assistance. Felt to be ATN related to hypotension. Continuing with IV hydration at this point  Code Status: full code Family Communication: discussed with patient's daughter Disposition Plan: pending further hospital course   Consultants:  GI  Pulmonology  nephrology  Procedures: EGD: ESOPHAGUS: There were 5 columns of large varices in the lower third of the esophagus. There WAS ONE VARIX WITH evidence of a white nipple sign. 4 BANDING SITES IN DISTAL ESOPHAGUS. COMPLICATIONS: There were no immediate complications. PT VOMITED  BLOOD DURING PROCEDURE.  Antibiotics:  levaquin 03/12/2014>>  HPI/Subjective: Patient is intubated and sedated  Objective: Filed Vitals:   02/27/14 0915  BP: 93/53  Pulse: 82  Temp:   Resp: 17    Intake/Output Summary (Last 24 hours) at 02/27/14 0951 Last data filed at 02/27/14 0500  Gross per 24 hour  Intake 3125.28 ml  Output    760 ml  Net 2365.28 ml   Filed Weights   01-02-2014 1641 02/26/14 0100 02/27/14 0500  Weight: 66.6 kg (146 lb 13.2 oz) 73 kg (160 lb 15 oz) 73 kg (160 lb 15 oz)    Exam:   General:  Sedated, on vent  Cardiovascular: s1, s2 rrr  Respiratory: cta b  Abdomen: soft, nt, distended, bs+  Musculoskeletal: trace edema b/l   Data Reviewed: Basic Metabolic Panel:  Recent Labs Lab 01-02-2014 1040 01-02-2014 1439 02/26/14 0828 02/27/14 0159  NA 142  --  139 137  K 5.6* 5.0 4.4 4.3  CL 103  --  108 107  CO2 16*  --  23 22  GLUCOSE 90  --  114* 90  BUN 46*  --  59* 59*  CREATININE 1.87*  --  2.58* 2.60*  CALCIUM 8.0*  --  6.9* 7.2*   Liver Function Tests:  Recent Labs Lab 02/28/14 1040 02/26/14 0828 02/27/14 0159  AST 206* 186* 231*  ALT 71* 72* 98*  ALKPHOS 82 80 75  BILITOT 4.3* 4.5* 4.5*  PROT 4.9* 4.5* 4.8*  ALBUMIN 1.9* 1.9* 2.0*    Recent Labs Lab 02/28/14 1040   LIPASE 44    Recent Labs Lab 02/28/2014 1040  AMMONIA 45   CBC:  Recent Labs Lab 02/28/2014 1040  02/26/14 0828 02/26/14 1357 02/26/14 1938 02/27/14 0159 02/27/14 0813  WBC 12.1*  --   --   --   --  9.6  --   NEUTROABS 9.8*  --   --   --   --   --   --   HGB 7.0*  < > 9.6* 10.0* 9.9* 10.4* 10.8*  HCT 21.3*  < > 27.9* 29.0* 28.6* 30.3* 31.6*  MCV 94.7  --   --   --   --  87.1  --   PLT 94*  --   --   --   --  PLATELET CLUMPS NOTED ON SMEAR, COUNT APPEARS DECREASED  --   < > = values in this interval not displayed. Cardiac Enzymes: No results for input(s): CKTOTAL, CKMB, CKMBINDEX, TROPONINI in the last 168 hours. BNP (last 3 results) No results for input(s): PROBNP in the last 8760 hours. CBG: No results for input(s): GLUCAP in the last 168 hours.  Recent Results (from the past 240 hour(s))  MRSA PCR Screening     Status: None   Collection Time: February 28, 2014  2:15 PM  Result Value Ref Range Status   MRSA by PCR NEGATIVE NEGATIVE Final    Comment:        The GeneXpert MRSA Assay (FDA approved for NASAL specimens only), is one component of a comprehensive MRSA colonization surveillance program. It is not intended to diagnose MRSA infection nor to guide or monitor treatment for MRSA infections.      Studies: US Renal  02/26/2014   CLINICAL DATA:  Acute tubular necrosis.  EXAM: RENAL/URINARY TRACT ULTRASOUND COMPLETE  COMPARISON:  Ultrasound the abdomen on 01/09/2014  FINDINGS: Right Kidney:  Length: 14.8 cm. No hydronephrosis. No definite shadowing calculi identified. No focal mass.  Left Kidney:  Length: 14.6 cm. No hydronephrosis. No shadowing calculi identified. No focal mass.  Bladder:  Foley catheter decompresses the bladder.  Additional findings:  Note is made of splenomegaly. Splenic volume is 948.1 cubic cm. Ascites is also present.  IMPRESSION: 1. No hydronephrosis or focal renal mass. 2. Splenomegaly. 3. Ascites.   Electronically Signed   By: Rosalie Gums M.D.   On:  02/26/2014 17:04   Dg Chest Port 1 View  02/27/2014   CLINICAL DATA:  Respiratory failure.  EXAM: PORTABLE CHEST - 1 VIEW  COMPARISON:  02/26/2014 .  FINDINGS: Endotracheal tube and left IJ line in stable position. Mediastinum and hilar structures are unremarkable. Stable bibasilar atelectasis and/or mild infiltrates. No pleural effusion or pneumothorax. Heart size normal. Pulmonary vascularity normal. No pleural effusion or pneumothorax. Prior plate and screw fixation left clavicle.  IMPRESSION: 1. Lines and tubes in stable position. 2. Stable bibasilar atelectasis and/or mild infiltrates.   Electronically Signed   By: Maisie Fus  Register   On: 02/27/2014 07:26   Portable Chest Xray  02/26/2014   CLINICAL DATA:  Acute respiratory failure  EXAM: PORTABLE CHEST - 1 VIEW  COMPARISON:  2014/02/28  FINDINGS: Cardiac shadow is stable. An endotracheal tube is again  seen 3.9 cm above the carina. A left central venous line is again noted in the mid portion of the left innominate vein. Lungs are again aerated with patchy bibasilar atelectatic changes. No focal confluent infiltrate is seen. Postsurgical changes in the left clavicle are noted.  IMPRESSION: Overall stable appearance with bibasilar atelectasis.  The left jugular central line does appear to have advanced slightly in the interval from the prior exam   Electronically Signed   By: Alcide CleverMark  Lukens M.D.   On: 02/26/2014 08:00   Dg Chest Port 1 View  02/24/2014   CLINICAL DATA:  Post intubation radiograph.  Subsequent encounter.  EXAM: PORTABLE CHEST - 1 VIEW  COMPARISON:  05-28-13, 1151 hr  FINDINGS: Support apparatus: Endotracheal tube tip is 41 mm from the carina. There is a LEFT IJ central line with the tip difficult to visualize. The furthest visible tip is in the LEFT brachiocephalic vein. Monitoring leads project over the chest.  Cardiomediastinal Silhouette:  Unchanged.  Lungs: Basilar atelectasis and lower lung volumes.  No pneumothorax.  Effusions:   None.  Other:  None.  IMPRESSION: 1. Endotracheal tube 4 cm from the carina. 2. LEFT IJ central line. The tip appears to be in the superior LEFT brachiocephalic vein. This could also be within the distal internal jugular vein. 3. Low volume chest.   Electronically Signed   By: Andreas NewportGeoffrey  Lamke M.D.   On: 05-28-13 17:21   Dg Abd Acute W/chest  03/16/2014   CLINICAL DATA:  58 year old male with abdominal pain in all quadrants, gastrointestinal bleeding. Blood in stool for 4 days with nausea and vomiting. Initial encounter.  EXAM: ACUTE ABDOMEN SERIES (ABDOMEN 2 VIEW & CHEST 1 VIEW)  COMPARISON:  Chest CT 01/08/2014 and earlier. CT Abdomen and Pelvis 01/06/2014.  FINDINGS: AP upright view of the chest. Stable lung volumes. The lungs are clear. Stable left clavicle ORIF. Normal cardiac size and mediastinal contours. No pneumothorax or pneumoperitoneum identified.  Gasless abdomen. Wallace CullensGray hazy opacity throughout the abdomen suggestive of ascites which was present on 01/06/2014. No acute osseous abnormality identified.  IMPRESSION: 1. No free air. Gasless abdomen with gray hazy opacity throughout suggesting ascites. See also CT Abdomen and Pelvis 01/06/2014 2.  No acute cardiopulmonary abnormality.   Electronically Signed   By: Augusto GambleLee  Hall M.D.   On: 05-28-13 12:24    Scheduled Meds: . antiseptic oral rinse  7 mL Mouth Rinse QID  . chlorhexidine  15 mL Mouth Rinse BID  . folic acid  1 mg Oral Daily  . lactulose  20 g Oral BID  . levofloxacin (LEVAQUIN) IV  750 mg Intravenous Q48H  . pantoprazole (PROTONIX) IV  80 mg Intravenous Q12H  . phytonadione (VITAMIN K) IV  10 mg Intravenous Q24H  . sodium chloride  3 mL Intravenous Q12H   Continuous Infusions: . sodium chloride 100 mL/hr at 02/27/14 0820  . fentaNYL infusion INTRAVENOUS 100 mcg/hr (02/26/14 2100)  . norepinephrine (LEVOPHED) Adult infusion 14 mcg/min (02/27/14 0300)  . octreotide  (SANDOSTATIN)    IV infusion 50 mcg/hr (02/27/14 0215)  .  propofol Stopped (02/26/14 2100)    Principal Problem:   Upper GI bleed Active Problems:   Abdominal pain   Hepatitis C   Esophageal varices   Cirrhosis   GIB (gastrointestinal bleeding)   Hemorrhagic shock   Acute renal failure    Time spent: critical care time: 30 mins    Dyana Magner  [facility] Pager [phone]. If 7PM-7AM, please contact night-coverage at  [email or web address], password [facility] 02/27/2014, 9:51 AM  LOS: 2 days

## 2014-02-28 ENCOUNTER — Inpatient Hospital Stay (HOSPITAL_COMMUNITY): Payer: Medicaid Other

## 2014-02-28 DIAGNOSIS — N170 Acute kidney failure with tubular necrosis: Secondary | ICD-10-CM | POA: Insufficient documentation

## 2014-02-28 LAB — BASIC METABOLIC PANEL
ANION GAP: 10 (ref 5–15)
BUN: 62 mg/dL — ABNORMAL HIGH (ref 6–23)
CALCIUM: 7.3 mg/dL — AB (ref 8.4–10.5)
CHLORIDE: 109 meq/L (ref 96–112)
CO2: 19 mEq/L (ref 19–32)
Creatinine, Ser: 2.56 mg/dL — ABNORMAL HIGH (ref 0.50–1.35)
GFR calc non Af Amer: 26 mL/min — ABNORMAL LOW (ref >90)
GFR, EST AFRICAN AMERICAN: 30 mL/min — AB (ref >90)
Glucose, Bld: 88 mg/dL (ref 70–99)
Potassium: 4.4 mEq/L (ref 3.7–5.3)
Sodium: 138 mEq/L (ref 137–147)

## 2014-02-28 LAB — GLUCOSE, CAPILLARY: Glucose-Capillary: 81 mg/dL (ref 70–99)

## 2014-02-28 LAB — PROTIME-INR
INR: 1.96 — AB (ref 0.00–1.49)
PROTHROMBIN TIME: 22.5 s — AB (ref 11.6–15.2)

## 2014-02-28 LAB — CBC
HCT: 29.4 % — ABNORMAL LOW (ref 39.0–52.0)
Hemoglobin: 9.9 g/dL — ABNORMAL LOW (ref 13.0–17.0)
MCH: 30 pg (ref 26.0–34.0)
MCHC: 33.7 g/dL (ref 30.0–36.0)
MCV: 89.1 fL (ref 78.0–100.0)
Platelets: 68 10*3/uL — ABNORMAL LOW (ref 150–400)
RBC: 3.3 MIL/uL — ABNORMAL LOW (ref 4.22–5.81)
RDW: 16.8 % — AB (ref 11.5–15.5)
WBC: 8.7 10*3/uL (ref 4.0–10.5)

## 2014-02-28 LAB — HEMOGLOBIN AND HEMATOCRIT, BLOOD
HEMATOCRIT: 30 % — AB (ref 39.0–52.0)
HEMOGLOBIN: 10.3 g/dL — AB (ref 13.0–17.0)

## 2014-02-28 MED ORDER — FUROSEMIDE 10 MG/ML IJ SOLN
40.0000 mg | Freq: Two times a day (BID) | INTRAMUSCULAR | Status: DC
  Administered 2014-02-28 – 2014-03-01 (×3): 40 mg via INTRAVENOUS
  Filled 2014-02-28 (×3): qty 4

## 2014-02-28 MED ORDER — NOREPINEPHRINE BITARTRATE 1 MG/ML IV SOLN
INTRAVENOUS | Status: AC
  Filled 2014-02-28: qty 16

## 2014-02-28 MED ORDER — FENTANYL CITRATE 0.05 MG/ML IJ SOLN
INTRAMUSCULAR | Status: AC
  Filled 2014-02-28: qty 50

## 2014-02-28 MED ORDER — ALBUMIN HUMAN 25 % IV SOLN
25.0000 g | Freq: Two times a day (BID) | INTRAVENOUS | Status: AC
  Administered 2014-02-28 – 2014-03-01 (×4): 25 g via INTRAVENOUS
  Filled 2014-02-28 (×4): qty 100

## 2014-02-28 MED ORDER — PANTOPRAZOLE SODIUM 40 MG IV SOLR
40.0000 mg | Freq: Two times a day (BID) | INTRAVENOUS | Status: DC
  Administered 2014-02-28 – 2014-03-09 (×18): 40 mg via INTRAVENOUS
  Filled 2014-02-28 (×18): qty 40

## 2014-02-28 NOTE — Progress Notes (Signed)
Subjective:  Remains sedated/intubated.   Objective: Vital signs in last 24 hours: Temp:  [97 F (36.1 C)-98.5 F (36.9 C)] 98.5 F (36.9 C) (11/06 0729) Pulse Rate:  [76-110] 81 (11/06 0700) Resp:  [14-34] 14 (11/06 0700) BP: (78-108)/(41-86) 80/48 mmHg (11/06 0700) SpO2:  [97 %-100 %] 98 % (11/06 0700) FiO2 (%):  [40 %] 40 % (11/06 0423) Weight:  [161 lb 6 oz (73.2 kg)] 161 lb 6 oz (73.2 kg) (11/06 0500) Last BM Date: 02/26/14 General:   Sedated Head:  Normocephalic and atraumatic. Eyes:  Sclera clear, + icterus.  Chest: CTA bilaterally without rales, rhonchi, crackles.    Heart:  Regular rate and rhythm; no murmurs, clicks, rubs,  or gallops. Abdomen:  Soft, distended. Hypoactive BS.   Extremities:  Without clubbing, deformity or edema. Neurologic:  sedated. Skin:  Intact without significant lesions or rashes. Psych:  sedated.  Intake/Output from previous day: 11/05 0701 - 11/06 0700 In: 4342.4 [I.V.:3892.4; IV Piggyback:450] Out: 1050 [Urine:1050] Intake/Output this shift:    Lab Results: CBC  Recent Labs  03/06/2014 1040  02/27/14 0159  02/27/14 1404 02/27/14 1942 02/28/14 0159  WBC 12.1*  --  9.6  --   --   --  8.7  HGB 7.0*  < > 10.4*  < > 10.2* 9.7* 9.9*  HCT 21.3*  < > 30.3*  < > 30.3* 29.0* 29.4*  MCV 94.7  --  87.1  --   --   --  89.1  PLT 94*  --  PLATELET CLUMPS NOTED ON SMEAR, COUNT APPEARS DECREASED  --   --   --  68*  < > = values in this interval not displayed. BMET  Recent Labs  02/26/14 0828 02/27/14 0159 02/28/14 0159  NA 139 137 138  K 4.4 4.3 4.4  CL 108 107 109  CO2 23 22 19   GLUCOSE 114* 90 88  BUN 59* 59* 62*  CREATININE 2.58* 2.60* 2.56*  CALCIUM 6.9* 7.2* 7.3*   LFTs  Recent Labs  02/26/2014 1040 02/26/14 0828 02/27/14 0159  BILITOT 4.3* 4.5* 4.5*  BILIDIR  --  2.7*  --   IBILI  --  1.8*  --   ALKPHOS 82 80 75  AST 206* 186* 231*  ALT 71* 72* 98*  PROT 4.9* 4.5* 4.8*  ALBUMIN 1.9* 1.9* 2.0*    Recent Labs   03/20/2014 1040  LIPASE 44   PT/INR  Recent Labs  02/26/2014 1439 02/26/14 0828 02/28/14 0159  LABPROT 28.7* 21.2* 22.5*  INR 2.68* 1.82* 1.96*      Imaging Studies: [place] Renal  02/26/2014   CLINICAL DATA:  Acute tubular necrosis.  EXAM: RENAL/URINARY TRACT ULTRASOUND COMPLETE  COMPARISON:  Ultrasound the abdomen on 01/09/2014  FINDINGS: Right Kidney:  Length: 14.8 cm. No hydronephrosis. No definite shadowing calculi identified. No focal mass.  Left Kidney:  Length: 14.6 cm. No hydronephrosis. No shadowing calculi identified. No focal mass.  Bladder:  Foley catheter decompresses the bladder.  Additional findings:  Note is made of splenomegaly. Splenic volume is 948.1 cubic cm. Ascites is also present.  IMPRESSION: 1. No hydronephrosis or focal renal mass. 2. Splenomegaly. 3. Ascites.   Electronically Signed   By: Rosalie GumsBeth  Follette M.D.   On: 02/26/2014 17:04   [place] Paracentesis  02/21/2014   CLINICAL DATA:  Cirrhosis, ascites  EXAM: ULTRASOUND GUIDED THERAPEUTIC PARACENTESIS  COMPARISON:  01/27/2014  PROCEDURE: Procedure, benefits, and risks of procedure were discussed with patient.  Written informed consent for  procedure was obtained.  Time out protocol followed.  Adequate collection of ascites localized by ultrasound in RIGHT lower quadrant.  Skin prepped and draped in usual sterile fashion.  Skin and soft tissues anesthetized with 10 mL of 1% lidocaine.  5 [place] catheter placed into peritoneal cavity.  3000 mL of yellow fluid aspirated by vacuum bottle suction.  Procedure tolerated well by patient without immediate complication.  FINDINGS: As above  IMPRESSION: Successful ultrasound guided paracentesis yielding 3000 mL of ascites.   Electronically Signed   By: Ulyses SouthwardMark  Boles M.D.   On: 02/21/2014 15:27   Dg Chest Port 1 View  02/28/2014   CLINICAL DATA:  Acute respiratory failure  EXAM: PORTABLE CHEST - 1 VIEW  COMPARISON:  02/27/2014  FINDINGS: Endotracheal tube is noted 6 cm above the carina and  stable in appearance. A left jugular line is again seen in the left innominate vein. Postsurgical changes in the left clavicle are noted. The cardiac shadow is stable. The overall inspiratory effort is poor with right basilar atelectasis and crowding of the vascular markings. No acute bony abnormality is noted.  IMPRESSION: Right basilar atelectasis.   Electronically Signed   By: Alcide CleverMark  Lukens M.D.   On: 02/28/2014 06:57    Assessment:  58 year old male with history of ETOH/HCV cirrhosis, portal vein thrombosis on Xarelto as outpatient, presenting this admission with hematemesis and melena, acute blood loss anemia secondary to large variceal bleed. Procedure at bedside on 11/3 with 5 columns of large varices, banding X 4. 1 varix with evidence of white nipple sign. Required intubation and pressor support. MELD 29 and Child Pugh C.    No evidence of active bleeding since procedure. Received total of 4 units PRBCs and 2 FFP. Renal function stable over past 24 hours. Condition remains guarded. Appreciate assistance from nephrology and pulmonology. Patient remains intubated for airway protection with no plans per Dr. Juanetta GoslingHawkins for extubation until off Levophed.   His blood pressures at baseline run relative low normal. Suggest trial of taper. Discussed with ICU nursing staff, Lyman BishopLawrence. I will discuss with Dr. Darrick PennaFields as well.   Weight up 15 pounds since admission but stable over past 2 days. Recurrent ascites noted. Plans for albumin/lasix per renal.  Plan: 1. Octreotide infusion to be completed at 1500 today. 2. Complete 7 days of Levaquin. 3. Consider dropping back to checking hemoglobin daily. 4. Taper Levophed as feasible today. Patient has baseline low normal BPs anyway.  5. Will continue to follow closely with you.     LOS: 3 days   Darren Abbott  02/28/2014, 7:55 AM

## 2014-02-28 NOTE — Progress Notes (Signed)
ANTIBIOTIC CONSULT NOTE -   Pharmacy Consult for Levaquin Indication: intra abdominal infection  No Known Allergies  Patient Measurements: Height: 5\' 8"  (172.7 cm) Weight: 161 lb 6 oz (73.2 kg) IBW/kg (Calculated) : 68.4 Last recorded weight = 74Kg  Vital Signs: Temp: 98.5 F (36.9 C) (11/06 0729) Temp Source: Axillary (11/06 0729) BP: 87/47 mmHg (11/06 0900) Pulse Rate: 88 (11/06 0900) Intake/Output from previous day: 11/05 0701 - 11/06 0700 In: 4342.4 [I.V.:3892.4; IV Piggyback:450] Out: 1050 [Urine:1050] Intake/Output from this shift:    Labs:  Recent Labs  02/26/14 0828  02/27/14 0159  02/27/14 1910 02/27/14 1942 02/28/14 0159 02/28/14 0805  WBC  --   --  9.6  --   --   --  8.7  --   HGB 9.6*  < > 10.4*  < >  --  9.7* 9.9* 10.3*  PLT  --   --  PLATELET CLUMPS NOTED ON SMEAR, COUNT APPEARS DECREASED  --   --   --  68*  --   LABCREA  --   --   --   --  221.57  --   --   --   CREATININE 2.58*  --  2.60*  --   --   --  2.56*  --   < > = values in this interval not displayed. Estimated Creatinine Clearance: 30.4 mL/min (by C-G formula based on Cr of 2.56). No results for input(s): VANCOTROUGH, VANCOPEAK, VANCORANDOM, GENTTROUGH, GENTPEAK, GENTRANDOM, TOBRATROUGH, TOBRAPEAK, TOBRARND, AMIKACINPEAK, AMIKACINTROU, AMIKACIN in the last 72 hours.   Microbiology: Recent Results (from the past 720 hour(s))  MRSA PCR Screening     Status: None   Collection Time: 03-Feb-2014  2:15 PM  Result Value Ref Range Status   MRSA by PCR NEGATIVE NEGATIVE Final    Comment:        The GeneXpert MRSA Assay (FDA approved for NASAL specimens only), is one component of a comprehensive MRSA colonization surveillance program. It is not intended to diagnose MRSA infection nor to guide or monitor treatment for MRSA infections.    Medical History: Past Medical History  Diagnosis Date  . Malnutrition of moderate degree   . Altered mental status   . Unspecified essential hypertension    . Anxiety state, unspecified   . Transient ischemic attack (TIA), and cerebral infarction without residual deficits(V12.54)   . Body mass index between 19-24, adult   . Atrial fibrillation     episode at [place].  Mar[place] Kitchen. Heart murmur   . Obstructive sleep apnea (adult) (pediatric)     to start back after surgery- not used for 2-273months  . GERD (gastroesophageal reflux disease)   . Unspecified epilepsy without mention of intractable epilepsy 13    only once  . Hand laceration 80    end of all fingers on rt hand  . Old myocardial infarction   . Asthma     "outgrew it"  . Borderline diabetes   . 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 Acute alcoholic hepatitis     possible hepatic encephalopathy May 10, 2011  . Daily headache   . Migraine     "daily" (08/30/2013)  . Stroke ~ 2000    "mild"  . Arthritis     "I'm ate up w/it" (08/30/2013)  . Chronic lower back pain   . On home oxygen therapy     "2L prn" (08/30/2013)  . Chronic kidney disease     "  one isn't functioning like it should" (08/30/2013)  . Cirrhosis   . Esophageal varices   . Thrombus     chronic,junction of splenic portal vein  . Pulmonary nodules     chronic an new 2015  . Cataract    Medications:  Scheduled:  . albumin human  25 g Intravenous BID  . antiseptic oral rinse  7 mL Mouth Rinse QID  . chlorhexidine  15 mL Mouth Rinse BID  . folic acid  1 mg Oral Daily  . furosemide  40 mg Intravenous BID  . lactulose  20 g Oral BID  . levofloxacin (LEVAQUIN) IV  750 mg Intravenous Q48H  . pantoprazole (PROTONIX) IV  40 mg Intravenous BID  . phytonadione (VITAMIN K) IV  10 mg Intravenous Q24H  . sodium chloride  3 mL Intravenous Q12H   Assessment: 58yo male with h/o alcoholic cirrhosis admitted with abdominal pain.  Asked to initiate Levaquin for suspected intra abdominal infection.  SCr = 2.56.  Normalized clcr ~ 31.  Afebrile.  Levaquin 11/3 >>  Goal of Therapy:  Eradicate  infection.  Plan:  Levaquin 750mg  IV q48hrs Plan for 7 days total Rx per GI Monitor labs, renal fxn, and cultures  Valrie HartHall, Shalin Vonbargen A 02/28/2014,10:58 AM

## 2014-02-28 NOTE — Progress Notes (Signed)
Attempted to titrate levophed this morning. Dr. Charline BillsStated that a map of 6755 would be acceptable. I lowered levophed to 10mcg and then 2 hours latter was able to lower to 5. At 1400 patients blood pressure systolic dropped to 71 and I increased levophed back to 10 mcg.

## 2014-02-28 NOTE — Progress Notes (Signed)
[facility] PROGRESS NOTE  Darren DonHerbert E Abbott ZOX:[id number] DOB: 07/19/1955 DOA: 03/03/2014 PCP: Augustine RadarOBERSON, KRISTINA, MD  Summary:  This is a [age] year old gentleman with known history of alcoholic/hepatitis C cirrhosis and varices presents to the hospital with GI bleeding and abdominal pain. Shortly after admission, he required intubation for airway protection and became hypotensive requiring vasopressors. He was transfused PRBCs and had urgent endoscopy done which showed GI bleed due to esophageal varix.he is currently on a octreotide infusion and is continued on proton pump inhibitors. It is felt that if he rebleeds, he will likely require TIPS procedure. He currently remains on ventilator.  Assessment/Plan: 1. Acute esophageal variceal GI bleeding. Status post endoscopy with results noted as below. He was transfused 4 units of PRBCs and 2 units of FFP. Hemoglobin appears to be stable. Octreotide infusion set to complete today, continue proton pump inhibitors. Bleeding was further complicated by anticoagulation which is currently on hold. Continue to monitor hemoglobin. Since patient was having significant hematemesis on admission, he did require intubation for airway protection. Appreciate pulmonology assistance with vent management. 2. Acute blood loss anemia. Transfused 4 units PRBCs and 2 units of FFP. Continue to follow hemoglobin. Appears stable at this time. 3. Hemorrhagic shock. Related to #1 and #2. Still requiring levophed to maintain blood pressure. Attempts are being made to wean this off. 4. History of alcoholic/hepatitis C cirrhosis. Continue Levaquin for SBP treatment. He is developing ascites with on going hydration and low albumin state. May need to have paracentesis prior to discharge. Started on albumin infusions and lasix. 5. History of portal vein thrombosis. Not a candidate for anticoagulation. 6. History of paroxysmal atrial fibrillation. Not a candidate for  anticoagulation 7. 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 Patient is currently receiving vitamin K and INR is improving. 8. Acute renal failure. Appreciate nephrology assistance. Felt to be ATN related to hypotension. Creatinine appears to be stable at this time. Continuing with IV hydration at this point.also started on albumin infusions and intravenous Lasix. Continue to monitor urine output  Code Status: full code Family Communication: discussed with brother at the bedside Disposition Plan: pending further hospital course   Consultants:  GI  Pulmonology  nephrology  Procedures: EGD: ESOPHAGUS: There were 5 columns of large varices in the lower third of the esophagus. There WAS ONE VARIX WITH evidence of a white nipple sign. 4 BANDING SITES IN DISTAL ESOPHAGUS. COMPLICATIONS: There were no immediate complications. PT VOMITED  BLOOD DURING PROCEDURE.  Antibiotics:  levaquin 03/14/2014>>  HPI/Subjective: Patient is intubated and sedated  Objective: Filed Vitals:   02/28/14 0900  BP: 87/47  Pulse: 88  Temp:   Resp: 14    Intake/Output Summary (Last 24 hours) at 02/28/14 0959 Last data filed at 02/28/14 0600  Gross per 24 hour  Intake 4046.16 ml  Output   1050 ml  Net 2996.16 ml   Filed Weights   02/26/14 0100 02/27/14 0500 02/28/14 0500  Weight: 73 kg (160 lb 15 oz) 73 kg (160 lb 15 oz) 73.2 kg (161 lb 6 oz)    Exam:   General:  Sedated, on vent  Cardiovascular: s1, s2 rrr  Respiratory: cta b  Abdomen: soft, nt, distended, bs+  Musculoskeletal: trace edema b/l   Data Reviewed: Basic Metabolic Panel:  Recent Labs Lab 07/11/2013 1040 07/11/2013 1439 02/26/14 0828 02/27/14 0159 02/28/14 0159  NA 142  --  139 137 138  K 5.6* 5.0 4.4 4.3 4.4  CL 103  --  108 107 109  CO2 16*  --  23 22 19   GLUCOSE 90  --  114* 90 88  BUN 46*  --  59* 59* 62*  CREATININE 1.87*  --  2.58* 2.60* 2.56*  CALCIUM 8.0*  --   6.9* 7.2* 7.3*   Liver Function Tests:  Recent Labs Lab 03/03/2014 1040 02/26/14 0828 02/27/14 0159  AST 206* 186* 231*  ALT 71* 72* 98*  ALKPHOS 82 80 75  BILITOT 4.3* 4.5* 4.5*  PROT 4.9* 4.5* 4.8*  ALBUMIN 1.9* 1.9* 2.0*    Recent Labs Lab 03/13/2014 1040  LIPASE 44    Recent Labs Lab 03/06/2014 1040  AMMONIA 45   CBC:  Recent Labs Lab 03/03/2014 1040  02/27/14 0159 02/27/14 0813 02/27/14 1404 02/27/14 1942 02/28/14 0159 02/28/14 0805  WBC 12.1*  --  9.6  --   --   --  8.7  --   NEUTROABS 9.8*  --   --   --   --   --   --   --   HGB 7.0*  < > 10.4* 10.8* 10.2* 9.7* 9.9* 10.3*  HCT 21.3*  < > 30.3* 31.6* 30.3* 29.0* 29.4* 30.0*  MCV 94.7  --  87.1  --   --   --  89.1  --   PLT 94*  --  PLATELET CLUMPS NOTED ON SMEAR, COUNT APPEARS DECREASED  --   --   --  68*  --   < > = values in this interval not displayed. Cardiac Enzymes: No results for input(s): CKTOTAL, CKMB, CKMBINDEX, TROPONINI in the last 168 hours. BNP (last 3 results) No results for input(s): PROBNP in the last 8760 hours. CBG: No results for input(s): GLUCAP in the last 168 hours.  Recent Results (from the past 240 hour(s))  MRSA PCR Screening     Status: None   Collection Time: 03/02/2014  2:15 PM  Result Value Ref Range Status   MRSA by PCR NEGATIVE NEGATIVE Final    Comment:        The GeneXpert MRSA Assay (FDA approved for NASAL specimens only), is one component of a comprehensive MRSA colonization surveillance program. It is not intended to diagnose MRSA infection nor to guide or monitor treatment for MRSA infections.      Studies: US Renal  02/26/2014   CLINICAL DATA:  Acute tubular necrosis.  EXAM: RENAL/URINARY TRACT ULTRASOUND COMPLETE  COMPARISON:  Ultrasound the abdomen on 01/09/2014  FINDINGS: Right Kidney:  Length: 14.8 cm. No hydronephrosis. No definite shadowing calculi identified. No focal mass.  Left Kidney:  Length: 14.6 cm. No hydronephrosis. No shadowing calculi  identified. No focal mass.  Bladder:  Foley catheter decompresses the bladder.  Additional findings:  Note is made of splenomegaly. Splenic volume is 948.1 cubic cm. Ascites is also present.  IMPRESSION: 1. No hydronephrosis or focal renal mass. 2. Splenomegaly. 3. Ascites.   Electronically Signed   By: Rosalie Gums M.D.   On: 02/26/2014 17:04   Dg Chest Port 1 View  02/28/2014   CLINICAL DATA:  Acute respiratory failure  EXAM: PORTABLE CHEST - 1 VIEW  COMPARISON:  02/27/2014  FINDINGS: Endotracheal tube is noted 6 cm above the carina and stable in appearance. A left jugular line is again seen in the left innominate vein. Postsurgical changes in the left clavicle are noted. The cardiac shadow is stable. The overall inspiratory effort is poor with right basilar atelectasis and crowding of the vascular markings. No acute  bony abnormality is noted.  IMPRESSION: Right basilar atelectasis.   Electronically Signed   By: Alcide CleverMark  Lukens M.D.   On: 02/28/2014 06:57   Dg Chest Port 1 View  02/27/2014   CLINICAL DATA:  Respiratory failure.  EXAM: PORTABLE CHEST - 1 VIEW  COMPARISON:  02/26/2014 .  FINDINGS: Endotracheal tube and left IJ line in stable position. Mediastinum and hilar structures are unremarkable. Stable bibasilar atelectasis and/or mild infiltrates. No pleural effusion or pneumothorax. Heart size normal. Pulmonary vascularity normal. No pleural effusion or pneumothorax. Prior plate and screw fixation left clavicle.  IMPRESSION: 1. Lines and tubes in stable position. 2. Stable bibasilar atelectasis and/or mild infiltrates.   Electronically Signed   By: Maisie Fushomas  Register   On: 02/27/2014 07:26    Scheduled Meds: . albumin human  25 g Intravenous BID  . antiseptic oral rinse  7 mL Mouth Rinse QID  . chlorhexidine  15 mL Mouth Rinse BID  . folic acid  1 mg Oral Daily  . furosemide  40 mg Intravenous BID  . lactulose  20 g Oral BID  . levofloxacin (LEVAQUIN) IV  750 mg Intravenous Q48H  . pantoprazole  (PROTONIX) IV  80 mg Intravenous Q12H  . phytonadione (VITAMIN K) IV  10 mg Intravenous Q24H  . sodium chloride  3 mL Intravenous Q12H   Continuous Infusions: . sodium chloride 125 mL/hr at 02/28/14 0800  . fentaNYL infusion INTRAVENOUS 100 mcg/hr (02/28/14 0800)  . norepinephrine (LEVOPHED) Adult infusion 14 mcg/min (02/28/14 0800)  . octreotide  (SANDOSTATIN)    IV infusion 25 mcg (02/28/14 0600)  . propofol Stopped (02/26/14 2100)    Principal Problem:   Upper GI bleed Active Problems:   Abdominal pain   Hepatitis C   Esophageal varices   Cirrhosis   GIB (gastrointestinal bleeding)   Hemorrhagic shock   Acute renal failure    Time spent: critical care time: 30 mins    MEMON,JEHANZEB  [facility] Pager [phone]. If 7PM-7AM, please contact night-coverage at [email or web address], password [email or web address] 02/28/2014, 9:59 AM  LOS: 3 days

## 2014-02-28 NOTE — Progress Notes (Signed)
Nutrition Follow-up  INTERVENTION:  Recommend if unable to obtain enteral access consider initiation of TPN if pt is unable to wean in next 24-48 hr   NUTRITION DIAGNOSIS: Inadequate oral intake related to inability to eat as evidenced by NPO status day- 3  Goal: Pt to meet >/= 90% of their estimated nutrition needs; not met   Monitor: Nutrition support, labs, I/O's and wt changes    58 y.o. male  Admitting Dx: Upper GI bleed  ASSESSMENT:  Hx of cirrhosis and moderate malnutrition. Presents with acute esophageal GI bleeding, acute blood loss anemia, hemorrhagic shock.   Patient is continues on ventilator support to protect airway. No plan to wean at this time per MD due to pt requiring pressors. Positive fluid balance. No evidence of active bleeding new per GI. Pt is at risk for worsening malnutrition. If unable to wean or obtain enteral access within next 24-48 hr suggest to consider TPN .   Labs: Albumin- low  BUN, Creat.- remain elevated Hemoglobin/HCT -trending up  MV: 7.3  L/min Temp (24hrs), Avg:97.6 F (36.4 C), Min:97 F (36.1 C), Max:98.5 F (36.9 C)  Propofol changed to Fentanyl   Height: Ht Readings from Last 1 Encounters:  02/26/14 5' 8"  (1.727 m)    Weight: Wt Readings from Last 1 Encounters:  02/28/14 161 lb 6 oz (73.2 kg)    Ideal Body Weight: 154# (70 gr)   Wt Readings from Last 10 Encounters:  02/28/14 161 lb 6 oz (73.2 kg)  02/05/14 155 lb 3.2 oz (70.398 kg)  01/23/14 162 lb 6.4 oz (73.664 kg)  01/20/14 159 lb 12.8 oz (72.485 kg)  01/06/14 158 lb (71.668 kg)  01/01/14 150 lb (68.04 kg)  11/26/13 168 lb (76.204 kg)  10/24/13 152 lb (68.947 kg)  09/09/13 156 lb 12.8 oz (71.124 kg)  08/30/13 164 lb 14.5 oz (74.8 kg)    Usual Body Weight: 152-164#   BMI:  Body mass index is 24.54 kg/(m^2). normal range  Estimated Nutritional Needs: Kcal: 1582  Protein: 110 gr Fluid: 2.2 liters daily (normal needs)  Skin: intact  Diet Order: Diet  NPO time specified  EDUCATION NEEDS: -No education needs identified at this time   Intake/Output Summary (Last 24 hours) at 02/28/14 0822 Last data filed at 02/28/14 0600  Gross per 24 hour  Intake 4194.26 ml  Output   1050 ml  Net 3144.26 ml    Last BM: 11/4  Labs:   Recent Labs Lab 02/26/14 0828 02/27/14 0159 02/28/14 0159  NA 139 137 138  K 4.4 4.3 4.4  CL 108 107 109  CO2 23 22 19   BUN 59* 59* 62*  CREATININE 2.58* 2.60* 2.56*  CALCIUM 6.9* 7.2* 7.3*  GLUCOSE 114* 90 88    CBG (last 3)  No results for input(s): GLUCAP in the last 72 hours.  Scheduled Meds: . antiseptic oral rinse  7 mL Mouth Rinse QID  . chlorhexidine  15 mL Mouth Rinse BID  . folic acid  1 mg Oral Daily  . lactulose  20 g Oral BID  . levofloxacin (LEVAQUIN) IV  750 mg Intravenous Q48H  . pantoprazole (PROTONIX) IV  80 mg Intravenous Q12H  . phytonadione (VITAMIN K) IV  10 mg Intravenous Q24H  . sodium chloride  3 mL Intravenous Q12H    Continuous Infusions: . sodium chloride 125 mL/hr at 02/28/14 0800  . fentaNYL infusion INTRAVENOUS 100 mcg/hr (02/28/14 0800)  . norepinephrine (LEVOPHED) Adult infusion 14 mcg/min (02/28/14 0800)  .  octreotide  (SANDOSTATIN)    IV infusion 25 mcg (02/28/14 0600)  . propofol Stopped (02/26/14 2100)    Past Medical History  Diagnosis Date  . Malnutrition of moderate degree   . Altered mental status   . Unspecified essential hypertension   . Anxiety state, unspecified   . Transient ischemic attack (TIA), and cerebral infarction without residual deficits(V12.54)   . Body mass index between 19-24, adult   . Atrial fibrillation     episode at [place].  Marland Kitchen Heart murmur   . Obstructive sleep apnea (adult) (pediatric)     to start back after surgery- not used for 2-18month  . GERD (gastroesophageal reflux disease)   . Unspecified epilepsy without mention of intractable epilepsy 13    only once  . Hand laceration 80    end of all fingers on rt  hand  . Old myocardial infarction   . Asthma     "outgrew it"  . Borderline diabetes   . 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 Acute alcoholic hepatitis     possible hepatic encephalopathy May 10, 2011  . Daily headache   . Migraine     "daily" (08/30/2013)  . Stroke ~ 2000    "mild"  . Arthritis     "I'm ate up w/it" (08/30/2013)  . Chronic lower back pain   . On home oxygen therapy     "2L prn" (08/30/2013)  . Chronic kidney disease     "one isn't functioning like it should" (08/30/2013)  . Cirrhosis   . Esophageal varices   . Thrombus     chronic,junction of splenic portal vein  . Pulmonary nodules     chronic an new 2015  . Cataract     Past Surgical History  Procedure Laterality Date  . Clavicle surgery Left ~ 2008    "crushed it"  . Hand surgery Left     lacerations of thumb. reattached  . Nasal septum surgery  08/30/2013  . Rhinoplasty  08/30/2013  . Hand surgery Right 1980's    "all 4 fingers cut off and reattached"  . Foot fracture surgery Right   . Septoplasty N/A 08/30/2013    Procedure: SEPTOPLASTY;  Surgeon: JIzora Gala MD;  [place] [facility]  Service: ENT;  Laterality: N/A;  . Rhinoplasty N/A 08/30/2013    Procedure: RHINOPLASTY;  Surgeon: JIzora Gala MD;  [facility];  Service: ENT;  Laterality: N/A;  . Esophagogastroduodenoscopy  Dec 2014    Dr. PEarley Brooke Grade 2-3 esophageal varices s/p banding X 5. Erosive gastritis. Negative H.pylori  . Paracentesis      multiple  . Esophagogastroduodenoscopy N/A 02/24/2014    Procedure: ESOPHAGOGASTRODUODENOSCOPY (EGD);  Surgeon: SDanie Binder MD;  [facility];  Service: Endoscopy;  Laterality: N/A;    LColman CaterMS,RD,CSG,LDN Office: #[phone]: #[phone]

## 2014-02-28 NOTE — Progress Notes (Addendum)
Subjective: Interval History: none.  Objective: Vital signs in last 24 hours: Temp:  [97 F (36.1 C)-98.5 F (36.9 C)] 98.5 F (36.9 C) (11/06 0729) Pulse Rate:  [76-110] 95 (11/06 0800) Resp:  [14-34] 20 (11/06 0800) BP: (78-108)/(41-86) 98/61 mmHg (11/06 0800) SpO2:  [97 %-100 %] 100 % (11/06 0800) FiO2 (%):  [40 %] 40 % (11/06 0800) Weight:  [73.2 kg (161 lb 6 oz)] 73.2 kg (161 lb 6 oz) (11/06 0500) Weight change: 0.2 kg (7.1 oz)  Intake/Output from previous day: 11/05 0701 - 11/06 0700 In: 4342.4 [I.V.:3892.4; IV Piggyback:450] Out: 1050 [Urine:1050] Intake/Output this shift:   Generally patient remains intubated, he is awake. Chest decreased breath sound bilaterally. Heart exam revealed regular rate and rhythm, no murmur Abdomen: Seems to be moderately distended, positive bowel sound Extremities: Possibly trace edema   Lab Results:  Recent Labs  02/27/14 0159  02/28/14 0159 02/28/14 0805  WBC 9.6  --  8.7  --   HGB 10.4*  < > 9.9* 10.3*  HCT 30.3*  < > 29.4* 30.0*  PLT PLATELET CLUMPS NOTED ON SMEAR, COUNT APPEARS DECREASED  --  68*  --   < > = values in this interval not displayed. BMET:  Recent Labs  02/27/14 0159 02/28/14 0159  NA 137 138  K 4.3 4.4  CL 107 109  CO2 22 19  GLUCOSE 90 88  BUN 59* 62*  CREATININE 2.60* 2.56*  CALCIUM 7.2* 7.3*   No results for input(s): PTH in the last 72 hours. Iron Studies: No results for input(s): IRON, TIBC, TRANSFERRIN, FERRITIN in the last 72 hours.  Studies/Results: [place] Renal  02/26/2014   CLINICAL DATA:  Acute tubular necrosis.  EXAM: RENAL/URINARY TRACT ULTRASOUND COMPLETE  COMPARISON:  Ultrasound the abdomen on 01/09/2014  FINDINGS: Right Kidney:  Length: 14.8 cm. No hydronephrosis. No definite shadowing calculi identified. No focal mass.  Left Kidney:  Length: 14.6 cm. No hydronephrosis. No shadowing calculi identified. No focal mass.  Bladder:  Foley catheter decompresses the bladder.  Additional findings:   Note is made of splenomegaly. Splenic volume is 948.1 cubic cm. Ascites is also present.  IMPRESSION: 1. No hydronephrosis or focal renal mass. 2. Splenomegaly. 3. Ascites.   Electronically Signed   By: Rosalie GumsBeth  Nolley M.D.   On: 02/26/2014 17:04   Dg Chest Port 1 View  02/28/2014   CLINICAL DATA:  Acute respiratory failure  EXAM: PORTABLE CHEST - 1 VIEW  COMPARISON:  02/27/2014  FINDINGS: Endotracheal tube is noted 6 cm above the carina and stable in appearance. A left jugular line is again seen in the left innominate vein. Postsurgical changes in the left clavicle are noted. The cardiac shadow is stable. The overall inspiratory effort is poor with right basilar atelectasis and crowding of the vascular markings. No acute bony abnormality is noted.  IMPRESSION: Right basilar atelectasis.   Electronically Signed   By: Alcide CleverMark  Lukens M.D.   On: 02/28/2014 06:57   Dg Chest Port 1 View  02/27/2014   CLINICAL DATA:  Respiratory failure.  EXAM: PORTABLE CHEST - 1 VIEW  COMPARISON:  02/26/2014 .  FINDINGS: Endotracheal tube and left IJ line in stable position. Mediastinum and hilar structures are unremarkable. Stable bibasilar atelectasis and/or mild infiltrates. No pleural effusion or pneumothorax. Heart size normal. Pulmonary vascularity normal. No pleural effusion or pneumothorax. Prior plate and screw fixation left clavicle.  IMPRESSION: 1. Lines and tubes in stable position. 2. Stable bibasilar atelectasis and/or mild infiltrates.  Electronically Signed   By: Maisie Fushomas  Register   On: 02/27/2014 07:26    I have reviewed the patient's current medications.  Assessment/Plan: Problem #1 acute kidney injury: Possibly prerenal versus ATN. Presently his renal function seems to be improving. Patient presently is not oliguric. Patient however is with positive fluid balance. Problem #2 hypotension: Most likely from hemorrhagic shock. His systolic blood pressure still low.patient on pressure support. Problem #3 history of  liver cirrhosis Problem #4 history of hepatitis C Problem #5 a trial fibrillation: His heart rate is controlled Problem #6 patient initially intubated for airway protection. Presently he remains intubated. Problem #7 anemia: Due to esophageal varices. Status post banding presently no signs of bleeding.patient has received blood transfusion and his hemoglobin at this moment is good. Patient is closely followed by GI. Plan: We'll continue with slow hydration Will start patient on Lasix 40 mg IV twice a day We'll give 25% solution 25 g of albumin IV twice a day for 2 days Monitor his input and output.    LOS: 3 days   Jane Birkel S 02/28/2014,8:22 AM

## 2014-02-28 NOTE — Progress Notes (Signed)
Subjective: He remains intubated and on the ventilator. He is still on pressors.  Objective: Vital signs in last 24 hours: Temp:  [97 F (36.1 C)-98.5 F (36.9 C)] 98.5 F (36.9 C) (11/06 0729) Pulse Rate:  [76-110] 95 (11/06 0800) Resp:  [14-34] 20 (11/06 0800) BP: (78-108)/(41-86) 98/61 mmHg (11/06 0800) SpO2:  [97 %-100 %] 100 % (11/06 0800) FiO2 (%):  [40 %] 40 % (11/06 0423) Weight:  [73.2 kg (161 lb 6 oz)] 73.2 kg (161 lb 6 oz) (11/06 0500) Weight change: 0.2 kg (7.1 oz) Last BM Date: 02/26/14  Intake/Output from previous day: 11/05 0701 - 11/06 0700 In: 4342.4 [I.V.:3892.4; IV Piggyback:450] Out: 1050 [Urine:1050]  PHYSICAL EXAM General appearance: he is more alert. He is jaundiced. Resp: rhonchi bilaterally Cardio: regular rate and rhythm, S1, S2 normal, no murmur, click, rub or gallop GI: soft, non-tender; bowel sounds normal; no masses,  no organomegaly Extremities: trace to 1+  Lab Results:  Results for orders placed or performed during the hospital encounter of 03/07/2014 (from the past 48 hour(s))  Basic metabolic panel     Status: Abnormal   Collection Time: 02/26/14  8:28 AM  Result Value Ref Range   Sodium 139 137 - 147 mEq/L   Potassium 4.4 3.7 - 5.3 mEq/L   Chloride 108 96 - 112 mEq/L   CO2 23 19 - 32 mEq/L   Glucose, Bld 114 (H) 70 - 99 mg/dL   BUN 59 (H) 6 - 23 mg/dL   Creatinine, Ser 2.58 (H) 0.50 - 1.35 mg/dL   Calcium 6.9 (L) 8.4 - 10.5 mg/dL   GFR calc non Af Amer 26 (L) >90 mL/min   GFR calc Af Amer 30 (L) >90 mL/min    Comment: (NOTE) The eGFR has been calculated using the CKD EPI equation. This calculation has not been validated in all clinical situations. eGFR's persistently <90 mL/min signify possible Chronic Kidney Disease.    Anion gap 8 5 - 15  Hepatic function panel     Status: Abnormal   Collection Time: 02/26/14  8:28 AM  Result Value Ref Range   Total Protein 4.5 (L) 6.0 - 8.3 g/dL   Albumin 1.9 (L) 3.5 - 5.2 g/dL   AST 186  (H) 0 - 37 U/L   ALT 72 (H) 0 - 53 U/L   Alkaline Phosphatase 80 39 - 117 U/L   Total Bilirubin 4.5 (H) 0.3 - 1.2 mg/dL   Bilirubin, Direct 2.7 (H) 0.0 - 0.3 mg/dL   Indirect Bilirubin 1.8 (H) 0.3 - 0.9 mg/dL  Protime-INR     Status: Abnormal   Collection Time: 02/26/14  8:28 AM  Result Value Ref Range   Prothrombin Time 21.2 (H) 11.6 - 15.2 seconds   INR 1.82 (H) 0.00 - 1.49  Hemoglobin and hematocrit, blood     Status: Abnormal   Collection Time: 02/26/14  8:28 AM  Result Value Ref Range   Hemoglobin 9.6 (L) 13.0 - 17.0 g/dL    Comment: DELTA CHECK NOTED   HCT 27.9 (L) 39.0 - 52.0 %  Hemoglobin and hematocrit, blood     Status: Abnormal   Collection Time: 02/26/14  1:57 PM  Result Value Ref Range   Hemoglobin 10.0 (L) 13.0 - 17.0 g/dL   HCT 29.0 (L) 39.0 - 52.0 %  Hemoglobin and hematocrit, blood     Status: Abnormal   Collection Time: 02/26/14  7:38 PM  Result Value Ref Range   Hemoglobin 9.9 (L) 13.0 -  17.0 g/dL   HCT 28.6 (L) 39.0 - 52.0 %  CBC     Status: Abnormal   Collection Time: 02/27/14  1:59 AM  Result Value Ref Range   WBC 9.6 4.0 - 10.5 K/uL   RBC 3.48 (L) 4.22 - 5.81 MIL/uL   Hemoglobin 10.4 (L) 13.0 - 17.0 g/dL   HCT 30.3 (L) 39.0 - 52.0 %   MCV 87.1 78.0 - 100.0 fL    Comment: DELTA CHECK NOTED   MCH 29.9 26.0 - 34.0 pg   MCHC 34.3 30.0 - 36.0 g/dL   RDW 16.8 (H) 11.5 - 15.5 %   Platelets  150 - 400 K/uL    PLATELET CLUMPS NOTED ON SMEAR, COUNT APPEARS DECREASED    Comment: DELTA CHECK NOTED SPECIMEN CHECKED FOR CLOTS SMEAR STAINED AND AVAILABLE FOR REVIEW   Comprehensive metabolic panel     Status: Abnormal   Collection Time: 02/27/14  1:59 AM  Result Value Ref Range   Sodium 137 137 - 147 mEq/L   Potassium 4.3 3.7 - 5.3 mEq/L   Chloride 107 96 - 112 mEq/L   CO2 22 19 - 32 mEq/L   Glucose, Bld 90 70 - 99 mg/dL   BUN 59 (H) 6 - 23 mg/dL   Creatinine, Ser 2.60 (H) 0.50 - 1.35 mg/dL   Calcium 7.2 (L) 8.4 - 10.5 mg/dL   Total Protein 4.8 (L) 6.0 -  8.3 g/dL   Albumin 2.0 (L) 3.5 - 5.2 g/dL   AST 231 (H) 0 - 37 U/L   ALT 98 (H) 0 - 53 U/L   Alkaline Phosphatase 75 39 - 117 U/L   Total Bilirubin 4.5 (H) 0.3 - 1.2 mg/dL   GFR calc non Af Amer 26 (L) >90 mL/min   GFR calc Af Amer 30 (L) >90 mL/min    Comment: (NOTE) The eGFR has been calculated using the CKD EPI equation. This calculation has not been validated in all clinical situations. eGFR's persistently <90 mL/min signify possible Chronic Kidney Disease.    Anion gap 8 5 - 15  Hemoglobin and hematocrit, blood     Status: Abnormal   Collection Time: 02/27/14  8:13 AM  Result Value Ref Range   Hemoglobin 10.8 (L) 13.0 - 17.0 g/dL   HCT 31.6 (L) 39.0 - 52.0 %  Hemoglobin and hematocrit, blood     Status: Abnormal   Collection Time: 02/27/14  2:04 PM  Result Value Ref Range   Hemoglobin 10.2 (L) 13.0 - 17.0 g/dL   HCT 30.3 (L) 39.0 - 52.0 %  Sodium, urine, random     Status: None   Collection Time: 02/27/14  7:10 PM  Result Value Ref Range   Sodium, Ur <20 mEq/L  Creatinine, urine, random     Status: None   Collection Time: 02/27/14  7:10 PM  Result Value Ref Range   Creatinine, Urine 221.57 mg/dL  Hemoglobin and hematocrit, blood     Status: Abnormal   Collection Time: 02/27/14  7:42 PM  Result Value Ref Range   Hemoglobin 9.7 (L) 13.0 - 17.0 g/dL   HCT 29.0 (L) 39.0 - 52.0 %  Protime-INR     Status: Abnormal   Collection Time: 02/28/14  1:59 AM  Result Value Ref Range   Prothrombin Time 22.5 (H) 11.6 - 15.2 seconds   INR 1.96 (H) 0.00 - 1.77  Basic metabolic panel     Status: Abnormal   Collection Time: 02/28/14  1:59 AM  Result  Value Ref Range   Sodium 138 137 - 147 mEq/L   Potassium 4.4 3.7 - 5.3 mEq/L   Chloride 109 96 - 112 mEq/L   CO2 19 19 - 32 mEq/L   Glucose, Bld 88 70 - 99 mg/dL   BUN 62 (H) 6 - 23 mg/dL   Creatinine, Ser 2.56 (H) 0.50 - 1.35 mg/dL   Calcium 7.3 (L) 8.4 - 10.5 mg/dL   GFR calc non Af Amer 26 (L) >90 mL/min   GFR calc Af Amer 30 (L)  >90 mL/min    Comment: (NOTE) The eGFR has been calculated using the CKD EPI equation. This calculation has not been validated in all clinical situations. eGFR's persistently <90 mL/min signify possible Chronic Kidney Disease.    Anion gap 10 5 - 15  CBC     Status: Abnormal   Collection Time: 02/28/14  1:59 AM  Result Value Ref Range   WBC 8.7 4.0 - 10.5 K/uL   RBC 3.30 (L) 4.22 - 5.81 MIL/uL   Hemoglobin 9.9 (L) 13.0 - 17.0 g/dL   HCT 29.4 (L) 39.0 - 52.0 %   MCV 89.1 78.0 - 100.0 fL   MCH 30.0 26.0 - 34.0 pg   MCHC 33.7 30.0 - 36.0 g/dL   RDW 16.8 (H) 11.5 - 15.5 %   Platelets 68 (L) 150 - 400 K/uL    Comment: RESULT REPEATED AND VERIFIED SPECIMEN CHECKED FOR CLOTS PLATELETS APPEAR DECREASED SMEAR STAINED AND AVAILABLE FOR REVIEW     ABGS  Recent Labs  03/07/2014 1818  PHART 7.348*  PO2ART 157.0*  TCO2 16.2  HCO3 16.6*   CULTURES Recent Results (from the past 240 hour(s))  MRSA PCR Screening     Status: None   Collection Time: 03/18/2014  2:15 PM  Result Value Ref Range Status   MRSA by PCR NEGATIVE NEGATIVE Final    Comment:        The GeneXpert MRSA Assay (FDA approved for NASAL specimens only), is one component of a comprehensive MRSA colonization surveillance program. It is not intended to diagnose MRSA infection nor to guide or monitor treatment for MRSA infections.    Studies/Results: US Renal  02/26/2014   CLINICAL DATA:  Acute tubular necrosis.  EXAM: RENAL/URINARY TRACT ULTRASOUND COMPLETE  COMPARISON:  Ultrasound the abdomen on 01/09/2014  FINDINGS: Right Kidney:  Length: 14.8 cm. No hydronephrosis. No definite shadowing calculi identified. No focal mass.  Left Kidney:  Length: 14.6 cm. No hydronephrosis. No shadowing calculi identified. No focal mass.  Bladder:  Foley catheter decompresses the bladder.  Additional findings:  Note is made of splenomegaly. Splenic volume is 948.1 cubic cm. Ascites is also present.  IMPRESSION: 1. No hydronephrosis or  focal renal mass. 2. Splenomegaly. 3. Ascites.   Electronically Signed   By: Shon Hale M.D.   On: 02/26/2014 17:04   Dg Chest Port 1 View  02/28/2014   CLINICAL DATA:  Acute respiratory failure  EXAM: PORTABLE CHEST - 1 VIEW  COMPARISON:  02/27/2014  FINDINGS: Endotracheal tube is noted 6 cm above the carina and stable in appearance. A left jugular line is again seen in the left innominate vein. Postsurgical changes in the left clavicle are noted. The cardiac shadow is stable. The overall inspiratory effort is poor with right basilar atelectasis and crowding of the vascular markings. No acute bony abnormality is noted.  IMPRESSION: Right basilar atelectasis.   Electronically Signed   By: Inez Catalina M.D.   On: 02/28/2014  06:57   Dg Chest Port 1 View  02/27/2014   CLINICAL DATA:  Respiratory failure.  EXAM: PORTABLE CHEST - 1 VIEW  COMPARISON:  02/26/2014 .  FINDINGS: Endotracheal tube and left IJ line in stable position. Mediastinum and hilar structures are unremarkable. Stable bibasilar atelectasis and/or mild infiltrates. No pleural effusion or pneumothorax. Heart size normal. Pulmonary vascularity normal. No pleural effusion or pneumothorax. Prior plate and screw fixation left clavicle.  IMPRESSION: 1. Lines and tubes in stable position. 2. Stable bibasilar atelectasis and/or mild infiltrates.   Electronically Signed   By: Marcello Moores  Register   On: 02/27/2014 07:26    Medications:  Prior to Admission:  Prescriptions prior to admission  Medication Sig Dispense Refill Last Dose  . folic acid (FOLVITE) 1 MG tablet Take 1 tablet (1 mg total) by mouth daily. 30 tablet 1 Past Week at Unknown time  . furosemide (LASIX) 20 MG tablet Take 1 tablet (20 mg total) by mouth daily. 30 tablet 3 Past Week at Unknown time  . Lactulose 20 GM/30ML SOLN Take 30 mLs (20 g total) by mouth 2 (two) times daily. 240 mL 3 Past Week at Unknown time  . metoprolol (LOPRESSOR) 50 MG tablet Take 0.5 tablets (25 mg total) by  mouth daily. 60 tablet 11 Past Week at Unknown time  . Multiple Vitamins-Minerals (CENTRUM SILVER PO) Take 1 tablet by mouth daily.   Past Week at Unknown time  . ondansetron (ZOFRAN) 4 MG tablet Take 4 mg by mouth every 8 (eight) hours as needed for nausea or vomiting.   Past Week at Unknown time  . oxazepam (SERAX) 10 MG capsule Take 10 mg by mouth 2 (two) times daily.   Past Week at Unknown time  . oxyCODONE (OXY IR/ROXICODONE) 5 MG immediate release tablet Take 1 tablet (5 mg total) by mouth every 4 (four) hours as needed for severe pain. 100 tablet 0 Past Week at Unknown time  . pantoprazole (PROTONIX) 40 MG tablet Take 1 tablet (40 mg total) by mouth 2 (two) times daily before a meal. 30 tablet 1 Past Week at Unknown time  . Rivaroxaban (XARELTO) 15 MG TABS tablet Take 1 tablet (15 mg total) by mouth daily with breakfast. 30 tablet 1 Past Week at Unknown time  . spironolactone (ALDACTONE) 50 MG tablet Take 1 tablet (50 mg total) by mouth daily. 30 tablet 3 Past Week at Unknown time   Scheduled: . antiseptic oral rinse  7 mL Mouth Rinse QID  . chlorhexidine  15 mL Mouth Rinse BID  . folic acid  1 mg Oral Daily  . lactulose  20 g Oral BID  . levofloxacin (LEVAQUIN) IV  750 mg Intravenous Q48H  . pantoprazole (PROTONIX) IV  80 mg Intravenous Q12H  . phytonadione (VITAMIN K) IV  10 mg Intravenous Q24H  . sodium chloride  3 mL Intravenous Q12H   Continuous: . sodium chloride 125 mL/hr at 02/28/14 0600  . fentaNYL infusion INTRAVENOUS 100 mcg/hr (02/28/14 0600)  . norepinephrine (LEVOPHED) Adult infusion 14 mcg/min (02/28/14 0600)  . octreotide  (SANDOSTATIN)    IV infusion 25 mcg (02/28/14 0600)  . propofol Stopped (02/26/14 2100)   NTZ:GYFVCBSW, fentaNYL, fentaNYL, LORazepam, morphine injection, sodium chloride  Assesment:he was admitted with GI bleeding due to esophageal varices and hemorrhagic shock. He was intubated for airway protection. He is still hypotensive and still requiring  pressor support. He does seem to be developing some ascitic fluid and he has a little bit of fluid  in his legs. Has acute renal failure which is improving to some extent. He may need some albumin as his last albumin level yesterday was 2 and he is starting to develop some fluid in his abdomen and legs I did not order that but will discuss with treatment team Principal Problem:   Upper GI bleed Active Problems:   Abdominal pain   Hepatitis C   Esophageal varices   Cirrhosis   GIB (gastrointestinal bleeding)   Hemorrhagic shock   Acute renal failure    Plan:continue current treatments. There's not much we can do now with his ventilator until we can get him off the pressors    LOS: 3 days   Jayen Bromwell L 02/28/2014, 8:11 AM

## 2014-03-01 ENCOUNTER — Inpatient Hospital Stay (HOSPITAL_COMMUNITY): Payer: Medicaid Other

## 2014-03-01 DIAGNOSIS — K740 Hepatic fibrosis: Secondary | ICD-10-CM

## 2014-03-01 DIAGNOSIS — I8501 Esophageal varices with bleeding: Secondary | ICD-10-CM

## 2014-03-01 DIAGNOSIS — K922 Gastrointestinal hemorrhage, unspecified: Secondary | ICD-10-CM

## 2014-03-01 DIAGNOSIS — B182 Chronic viral hepatitis C: Secondary | ICD-10-CM

## 2014-03-01 DIAGNOSIS — R188 Other ascites: Secondary | ICD-10-CM

## 2014-03-01 DIAGNOSIS — K7010 Alcoholic hepatitis without ascites: Secondary | ICD-10-CM

## 2014-03-01 LAB — CBC WITH DIFFERENTIAL/PLATELET
BASOS ABS: 0 10*3/uL (ref 0.0–0.1)
BASOS PCT: 1 % (ref 0–1)
EOS ABS: 0.3 10*3/uL (ref 0.0–0.7)
EOS PCT: 4 % (ref 0–5)
HEMATOCRIT: 26.8 % — AB (ref 39.0–52.0)
Hemoglobin: 8.9 g/dL — ABNORMAL LOW (ref 13.0–17.0)
Lymphocytes Relative: 13 % (ref 12–46)
Lymphs Abs: 0.8 10*3/uL (ref 0.7–4.0)
MCH: 30 pg (ref 26.0–34.0)
MCHC: 33.2 g/dL (ref 30.0–36.0)
MCV: 90.2 fL (ref 78.0–100.0)
Monocytes Absolute: 1.2 10*3/uL — ABNORMAL HIGH (ref 0.1–1.0)
Monocytes Relative: 19 % — ABNORMAL HIGH (ref 3–12)
Neutro Abs: 4.3 10*3/uL (ref 1.7–7.7)
Neutrophils Relative %: 64 % (ref 43–77)
PLATELETS: 49 10*3/uL — AB (ref 150–400)
RBC: 2.97 MIL/uL — ABNORMAL LOW (ref 4.22–5.81)
RDW: 17.6 % — AB (ref 11.5–15.5)
WBC: 6.7 10*3/uL (ref 4.0–10.5)

## 2014-03-01 LAB — AMMONIA: Ammonia: 68 umol/L — ABNORMAL HIGH (ref 11–60)

## 2014-03-01 LAB — TYPE AND SCREEN
ABO/RH(D): B POS
ANTIBODY SCREEN: NEGATIVE
UNIT DIVISION: 0
UNIT DIVISION: 0
UNIT DIVISION: 0
Unit division: 0
Unit division: 0
Unit division: 0

## 2014-03-01 LAB — BASIC METABOLIC PANEL
Anion gap: 13 (ref 5–15)
BUN: 65 mg/dL — AB (ref 6–23)
CALCIUM: 7.7 mg/dL — AB (ref 8.4–10.5)
CO2: 18 meq/L — AB (ref 19–32)
CREATININE: 2.85 mg/dL — AB (ref 0.50–1.35)
Chloride: 112 mEq/L (ref 96–112)
GFR calc Af Amer: 26 mL/min — ABNORMAL LOW (ref >90)
GFR, EST NON AFRICAN AMERICAN: 23 mL/min — AB (ref >90)
GLUCOSE: 85 mg/dL (ref 70–99)
Potassium: 4.5 mEq/L (ref 3.7–5.3)
SODIUM: 143 meq/L (ref 137–147)

## 2014-03-01 LAB — HEMOGLOBIN AND HEMATOCRIT, BLOOD
HCT: 26 % — ABNORMAL LOW (ref 39.0–52.0)
HEMOGLOBIN: 8.8 g/dL — AB (ref 13.0–17.0)

## 2014-03-01 MED ORDER — FUROSEMIDE 10 MG/ML IJ SOLN
100.0000 mg | Freq: Two times a day (BID) | INTRAVENOUS | Status: DC
  Administered 2014-03-01 – 2014-03-02 (×2): 100 mg via INTRAVENOUS
  Filled 2014-03-01 (×4): qty 10

## 2014-03-01 NOTE — Progress Notes (Signed)
[facility] PROGRESS NOTE  Nelle DonHerbert E Pennock ZOX:[id number] DOB: 01/16/1956 DOA: 03/16/2014 PCP: Augustine RadarOBERSON, KRISTINA, MD  Summary:  This is a 58 year old gentleman with known history of alcoholic/hepatitis C cirrhosis and varices presents to the hospital with GI bleeding and abdominal pain. Shortly after admission, he required intubation for airway protection and became hypotensive requiring vasopressors. He was transfused PRBCs and had urgent endoscopy done which showed GI bleed due to esophageal varix.he is currently on a octreotide infusion and is continued on proton pump inhibitors. It is felt that if he rebleeds, he will likely require TIPS procedure. He currently remains on ventilator.  Assessment/Plan: 1. Acute esophageal variceal GI bleeding. Status post endoscopy with results noted as below. He was transfused 4 units of PRBCs and 2 units of FFP. Hemoglobin appears to be stable. He has completed course of octreotide infusion, continue proton pump inhibitors. Bleeding was further complicated by anticoagulation which is currently on hold. Continue to monitor hemoglobin. Since patient was having significant hematemesis on admission, he did require intubation for airway protection. Appreciate pulmonology assistance with vent management. 2. Acute blood loss anemia. Transfused 4 units PRBCs and 2 units of FFP. Continue to follow hemoglobin. Hemoglobin trended down since yesterday, but no reported evidence of bleeding. May be dilutional. Continue to follow. Recheck hemoglobin in afternoon. 3. Hemorrhagic shock. Related to #1 and #2. Still requiring levophed to maintain blood pressure. Attempts are being made to wean this off. 4. History of alcoholic/hepatitis C cirrhosis. Continue Levaquin for SBP treatment. He is developing ascites with on going hydration and low albumin state. May need to have paracentesis prior to discharge. On albumin infusions and lasix. Monitor urine output 5. History of portal  vein thrombosis. Not a candidate for anticoagulation. 6. History of paroxysmal atrial fibrillation. Not a candidate for anticoagulation 7. 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 Patient is currently receiving vitamin K and INR is improving. 8. Acute renal failure. Appreciate nephrology assistance. Felt to be ATN related to hypotension. Creatinine has mildly trended up today. He is on intravenous fluids, albumin and lasix. Continue to follow urine output.  Code Status: full code Family Communication: discussed with brother at the bedside Disposition Plan: pending further hospital course   Consultants:  GI  Pulmonology  nephrology  Procedures: EGD: ESOPHAGUS: There were 5 columns of large varices in the lower third of the esophagus. There WAS ONE VARIX WITH evidence of a white nipple sign. 4 BANDING SITES IN DISTAL ESOPHAGUS. COMPLICATIONS: There were no immediate complications. PT VOMITED  BLOOD DURING PROCEDURE.  Antibiotics:  levaquin 02/24/2014>>  HPI/Subjective: Patient is intubated and sedated. He has been weaned down on levophed from 14mcgs to 4mcgs  Objective: Filed Vitals:   03/01/14 0731  BP:   Pulse: 89  Temp:   Resp: 19    Intake/Output Summary (Last 24 hours) at 03/01/14 0839 Last data filed at 03/01/14 0600  Gross per 24 hour  Intake 3622.55 ml  Output    900 ml  Net 2722.55 ml   Filed Weights   02/27/14 0500 02/28/14 0500 03/01/14 0500  Weight: 73 kg (160 lb 15 oz) 73.2 kg (161 lb 6 oz) 86 kg (189 lb 9.5 oz)    Exam:   General:  Sedated, on vent  Cardiovascular: s1, s2 rrr  Respiratory: cta b  Abdomen: soft, nt, distended, bs+  Musculoskeletal: trace edema b/l   Data Reviewed: Basic Metabolic Panel:  Recent Labs Lab 03/11/2014 1040 03/07/2014 1439 02/26/14 [id number]  02/27/14 0159 02/28/14 0159 03/01/14 0613  NA 142  --  139 137 138 143  K 5.6* 5.0 4.4 4.3 4.4 4.5  CL 103  --  108 107 109  112  CO2 16*  --  23 22 19  18*  GLUCOSE 90  --  114* 90 88 85  BUN 46*  --  59* 59* 62* 65*  CREATININE 1.87*  --  2.58* 2.60* 2.56* 2.85*  CALCIUM 8.0*  --  6.9* 7.2* 7.3* 7.7*   Liver Function Tests:  Recent Labs Lab 03/21/2014 1040 02/26/14 0828 02/27/14 0159  AST 206* 186* 231*  ALT 71* 72* 98*  ALKPHOS 82 80 75  BILITOT 4.3* 4.5* 4.5*  PROT 4.9* 4.5* 4.8*  ALBUMIN 1.9* 1.9* 2.0*    Recent Labs Lab 02/26/2014 1040  LIPASE 44    Recent Labs Lab 03/01/2014 1040  AMMONIA 45   CBC:  Recent Labs Lab 03/15/2014 1040  02/27/14 0159  02/27/14 1404 02/27/14 1942 02/28/14 0159 02/28/14 0805 03/01/14 0613  WBC 12.1*  --  9.6  --   --   --  8.7  --  6.7  NEUTROABS 9.8*  --   --   --   --   --   --   --  4.3  HGB 7.0*  < > 10.4*  < > 10.2* 9.7* 9.9* 10.3* 8.9*  HCT 21.3*  < > 30.3*  < > 30.3* 29.0* 29.4* 30.0* 26.8*  MCV 94.7  --  87.1  --   --   --  89.1  --  90.2  PLT 94*  --  PLATELET CLUMPS NOTED ON SMEAR, COUNT APPEARS DECREASED  --   --   --  68*  --  49*  < > = values in this interval not displayed. Cardiac Enzymes: No results for input(s): CKTOTAL, CKMB, CKMBINDEX, TROPONINI in the last 168 hours. BNP (last 3 results) No results for input(s): PROBNP in the last 8760 hours. CBG:  Recent Labs Lab 02/28/14 1102  GLUCAP 81    Recent Results (from the past 240 hour(s))  MRSA PCR Screening     Status: None   Collection Time: 03/17/2014  2:15 PM  Result Value Ref Range Status   MRSA by PCR NEGATIVE NEGATIVE Final    Comment:        The GeneXpert MRSA Assay (FDA approved for NASAL specimens only), is one component of a comprehensive MRSA colonization surveillance program. It is not intended to diagnose MRSA infection nor to guide or monitor treatment for MRSA infections.      Studies: Dg Chest Port 1 View  02/28/2014   CLINICAL DATA:  Acute respiratory failure  EXAM: PORTABLE CHEST - 1 VIEW  COMPARISON:  02/27/2014  FINDINGS: Endotracheal tube is noted  6 cm above the carina and stable in appearance. A left jugular line is again seen in the left innominate vein. Postsurgical changes in the left clavicle are noted. The cardiac shadow is stable. The overall inspiratory effort is poor with right basilar atelectasis and crowding of the vascular markings. No acute bony abnormality is noted.  IMPRESSION: Right basilar atelectasis.   Electronically Signed   By: Alcide CleverMark  Lukens M.D.   On: 02/28/2014 06:57    Scheduled Meds: . albumin human  25 g Intravenous BID  . antiseptic oral rinse  7 mL Mouth Rinse QID  . chlorhexidine  15 mL Mouth Rinse BID  . folic acid  1 mg Oral Daily  . furosemide  40 mg Intravenous BID  . lactulose  20 g Oral BID  . levofloxacin (LEVAQUIN) IV  750 mg Intravenous Q48H  . pantoprazole (PROTONIX) IV  40 mg Intravenous BID  . sodium chloride  3 mL Intravenous Q12H   Continuous Infusions: . sodium chloride 125 mL/hr at 03/01/14 0600  . fentaNYL infusion INTRAVENOUS 50 mcg/hr (03/01/14 0600)  . norepinephrine (LEVOPHED) Adult infusion 5 mcg/min (03/01/14 0600)    Principal Problem:   Upper GI bleed Active Problems:   Abdominal pain   Hepatitis C   Esophageal varices   Cirrhosis   GIB (gastrointestinal bleeding)   Hemorrhagic shock   Acute renal failure   ATN (acute tubular necrosis)    Time spent: critical care time: 30 mins    MEMON,JEHANZEB  [facility] Pager [phone]. If 7PM-7AM, please contact night-coverage at [email or web address], password [facility] 03/01/2014, 8:39 AM  LOS: 4 days

## 2014-03-01 NOTE — Plan of Care (Signed)
Problem: Phase I Progression Outcomes Goal: Oral Care per Protocol Outcome: Progressing Goal: Sedation Protocol initiated if indicated Outcome: Progressing Goal: Pain controlled with appropriate interventions Outcome: Progressing Goal: Hemodynamically stable Outcome: Progressing Goal: Baseline oxygen/pH stable Outcome: Progressing

## 2014-03-01 NOTE — Progress Notes (Signed)
Subjective: He is trying to wean today. However he is still on pressors. No GI bleeding.  Objective: Vital signs in last 24 hours: Temp:  [97.1 F (36.2 C)-99.1 F (37.3 C)] 97.1 F (36.2 C) (11/07 0730) Pulse Rate:  [77-126] 89 (11/07 0731) Resp:  [8-29] 19 (11/07 0731) BP: (71-118)/(40-80) 94/46 mmHg (11/07 0700) SpO2:  [96 %-100 %] 99 % (11/07 0731) FiO2 (%):  [40 %] 40 % (11/07 0751) Weight:  [86 kg (189 lb 9.5 oz)] 86 kg (189 lb 9.5 oz) (11/07 0500) Weight change: 12.8 kg (28 lb 3.5 oz) Last BM Date: 02/26/14  Intake/Output from previous day: 11/06 0701 - 11/07 0700 In: 3622.6 [I.V.:3322.6; IV Piggyback:300] Out: 900 [Urine:900]  PHYSICAL EXAM General appearance: alert and intubated on the ventilator tachycardic and mildly hypotensive Resp: rhonchi bilaterally Cardio: regular rate and rhythm, S1, S2 normal, no murmur, click, rub or gallop GI: soft, non-tender; bowel sounds normal; no masses,  no organomegaly Extremities: extremities normal, atraumatic, no cyanosis or edema  Lab Results:  Results for orders placed or performed during the hospital encounter of 03/01/2014 (from the past 48 hour(s))  Hemoglobin and hematocrit, blood     Status: Abnormal   Collection Time: 02/27/14  2:04 PM  Result Value Ref Range   Hemoglobin 10.2 (L) 13.0 - 17.0 g/dL   HCT 30.3 (L) 39.0 - 52.0 %  Sodium, urine, random     Status: None   Collection Time: 02/27/14  7:10 PM  Result Value Ref Range   Sodium, Ur <20 mEq/L  Creatinine, urine, random     Status: None   Collection Time: 02/27/14  7:10 PM  Result Value Ref Range   Creatinine, Urine 221.57 mg/dL  Hemoglobin and hematocrit, blood     Status: Abnormal   Collection Time: 02/27/14  7:42 PM  Result Value Ref Range   Hemoglobin 9.7 (L) 13.0 - 17.0 g/dL   HCT 29.0 (L) 39.0 - 52.0 %  Protime-INR     Status: Abnormal   Collection Time: 02/28/14  1:59 AM  Result Value Ref Range   Prothrombin Time 22.5 (H) 11.6 - 15.2 seconds   INR  1.96 (H) 0.00 - 1.49  Basic metabolic panel     Status: Abnormal   Collection Time: 02/28/14  1:59 AM  Result Value Ref Range   Sodium 138 137 - 147 mEq/L   Potassium 4.4 3.7 - 5.3 mEq/L   Chloride 109 96 - 112 mEq/L   CO2 19 19 - 32 mEq/L   Glucose, Bld 88 70 - 99 mg/dL   BUN 62 (H) 6 - 23 mg/dL   Creatinine, Ser 2.56 (H) 0.50 - 1.35 mg/dL   Calcium 7.3 (L) 8.4 - 10.5 mg/dL   GFR calc non Af Amer 26 (L) >90 mL/min   GFR calc Af Amer 30 (L) >90 mL/min    Comment: (NOTE) The eGFR has been calculated using the CKD EPI equation. This calculation has not been validated in all clinical situations. eGFR's persistently <90 mL/min signify possible Chronic Kidney Disease.    Anion gap 10 5 - 15  CBC     Status: Abnormal   Collection Time: 02/28/14  1:59 AM  Result Value Ref Range   WBC 8.7 4.0 - 10.5 K/uL   RBC 3.30 (L) 4.22 - 5.81 MIL/uL   Hemoglobin 9.9 (L) 13.0 - 17.0 g/dL   HCT 29.4 (L) 39.0 - 52.0 %   MCV 89.1 78.0 - 100.0 fL   MCH 30.0   26.0 - 34.0 pg   MCHC 33.7 30.0 - 36.0 g/dL   RDW 16.8 (H) 11.5 - 15.5 %   Platelets 68 (L) 150 - 400 K/uL    Comment: RESULT REPEATED AND VERIFIED SPECIMEN CHECKED FOR CLOTS PLATELETS APPEAR DECREASED SMEAR STAINED AND AVAILABLE FOR REVIEW   Hemoglobin and hematocrit, blood     Status: Abnormal   Collection Time: 02/28/14  8:05 AM  Result Value Ref Range   Hemoglobin 10.3 (L) 13.0 - 17.0 g/dL   HCT 30.0 (L) 39.0 - 52.0 %  Glucose, capillary     Status: None   Collection Time: 02/28/14 11:02 AM  Result Value Ref Range   Glucose-Capillary 81 70 - 99 mg/dL   Comment 1 Documented in Chart    Comment 2 Notify RN   Basic metabolic panel     Status: Abnormal   Collection Time: 03/01/14  6:13 AM  Result Value Ref Range   Sodium 143 137 - 147 mEq/L   Potassium 4.5 3.7 - 5.3 mEq/L   Chloride 112 96 - 112 mEq/L   CO2 18 (L) 19 - 32 mEq/L   Glucose, Bld 85 70 - 99 mg/dL   BUN 65 (H) 6 - 23 mg/dL   Creatinine, Ser 2.85 (H) 0.50 - 1.35 mg/dL    Calcium 7.7 (L) 8.4 - 10.5 mg/dL   GFR calc non Af Amer 23 (L) >90 mL/min   GFR calc Af Amer 26 (L) >90 mL/min    Comment: (NOTE) The eGFR has been calculated using the CKD EPI equation. This calculation has not been validated in all clinical situations. eGFR's persistently <90 mL/min signify possible Chronic Kidney Disease.    Anion gap 13 5 - 15  CBC with Differential     Status: Abnormal   Collection Time: 03/01/14  6:13 AM  Result Value Ref Range   WBC 6.7 4.0 - 10.5 K/uL   RBC 2.97 (L) 4.22 - 5.81 MIL/uL   Hemoglobin 8.9 (L) 13.0 - 17.0 g/dL   HCT 26.8 (L) 39.0 - 52.0 %   MCV 90.2 78.0 - 100.0 fL   MCH 30.0 26.0 - 34.0 pg   MCHC 33.2 30.0 - 36.0 g/dL   RDW 17.6 (H) 11.5 - 15.5 %   Platelets 49 (L) 150 - 400 K/uL    Comment: DELTA CHECK NOTED REPEATED TO VERIFY SPECIMEN CHECKED FOR CLOTS PLATELET COUNT CONFIRMED BY SMEAR    Neutrophils Relative % 64 43 - 77 %   Neutro Abs 4.3 1.7 - 7.7 K/uL   Lymphocytes Relative 13 12 - 46 %   Lymphs Abs 0.8 0.7 - 4.0 K/uL   Monocytes Relative 19 (H) 3 - 12 %   Monocytes Absolute 1.2 (H) 0.1 - 1.0 K/uL   Eosinophils Relative 4 0 - 5 %   Eosinophils Absolute 0.3 0.0 - 0.7 K/uL   Basophils Relative 1 0 - 1 %   Basophils Absolute 0.0 0.0 - 0.1 K/uL  Ammonia     Status: Abnormal   Collection Time: 03/01/14  9:57 AM  Result Value Ref Range   Ammonia 68 (H) 11 - 60 umol/L    ABGS No results for input(s): PHART, PO2ART, TCO2, HCO3 in the last 72 hours.  Invalid input(s): PCO2 CULTURES Recent Results (from the past 240 hour(s))  MRSA PCR Screening     Status: None   Collection Time: 03/08/2014  2:15 PM  Result Value Ref Range Status   MRSA by PCR NEGATIVE NEGATIVE Final      Comment:        The GeneXpert MRSA Assay (FDA approved for NASAL specimens only), is one component of a comprehensive MRSA colonization surveillance program. It is not intended to diagnose MRSA infection nor to guide or monitor treatment for MRSA  infections.    Studies/Results: Dg Chest Port 1 View  03/01/2014   CLINICAL DATA:  Respiratory failure.  EXAM: PORTABLE CHEST - 1 VIEW  COMPARISON:  02/28/2014  FINDINGS: There is an endotracheal tube with tip between the clavicular heads and carina. Left IJ catheter, tip at the distal left brachiocephalic vein.  Normal heart size and mediastinal contours.  There is diffuse interstitial coarsening, accentuated by low lung volumes. Atelectasis in the right lower lobe has mildly improved. No effusion or pneumothorax. There is diffuse interstitial coarsening which is likely venous congestion.  IMPRESSION: 1. Stable positioning of endotracheal tube and central line. 2. Mildly improved right basilar atelectasis.   Electronically Signed   By: Jorje Guild M.D.   On: 03/01/2014 10:16   Dg Chest Port 1 View  02/28/2014   CLINICAL DATA:  Acute respiratory failure  EXAM: PORTABLE CHEST - 1 VIEW  COMPARISON:  02/27/2014  FINDINGS: Endotracheal tube is noted 6 cm above the carina and stable in appearance. A left jugular line is again seen in the left innominate vein. Postsurgical changes in the left clavicle are noted. The cardiac shadow is stable. The overall inspiratory effort is poor with right basilar atelectasis and crowding of the vascular markings. No acute bony abnormality is noted.  IMPRESSION: Right basilar atelectasis.   Electronically Signed   By: Inez Catalina M.D.   On: 02/28/2014 06:57    Medications:  Prior to Admission:  Prescriptions prior to admission  Medication Sig Dispense Refill Last Dose  . folic acid (FOLVITE) 1 MG tablet Take 1 tablet (1 mg total) by mouth daily. 30 tablet 1 Past Week at Unknown time  . furosemide (LASIX) 20 MG tablet Take 1 tablet (20 mg total) by mouth daily. 30 tablet 3 Past Week at Unknown time  . Lactulose 20 GM/30ML SOLN Take 30 mLs (20 g total) by mouth 2 (two) times daily. 240 mL 3 Past Week at Unknown time  . metoprolol (LOPRESSOR) 50 MG tablet Take 0.5  tablets (25 mg total) by mouth daily. 60 tablet 11 Past Week at Unknown time  . Multiple Vitamins-Minerals (CENTRUM SILVER PO) Take 1 tablet by mouth daily.   Past Week at Unknown time  . ondansetron (ZOFRAN) 4 MG tablet Take 4 mg by mouth every 8 (eight) hours as needed for nausea or vomiting.   Past Week at Unknown time  . oxazepam (SERAX) 10 MG capsule Take 10 mg by mouth 2 (two) times daily.   Past Week at Unknown time  . oxyCODONE (OXY IR/ROXICODONE) 5 MG immediate release tablet Take 1 tablet (5 mg total) by mouth every 4 (four) hours as needed for severe pain. 100 tablet 0 Past Week at Unknown time  . pantoprazole (PROTONIX) 40 MG tablet Take 1 tablet (40 mg total) by mouth 2 (two) times daily before a meal. 30 tablet 1 Past Week at Unknown time  . Rivaroxaban (XARELTO) 15 MG TABS tablet Take 1 tablet (15 mg total) by mouth daily with breakfast. 30 tablet 1 Past Week at Unknown time  . spironolactone (ALDACTONE) 50 MG tablet Take 1 tablet (50 mg total) by mouth daily. 30 tablet 3 Past Week at Unknown time   Scheduled: . albumin human  25 g Intravenous BID  . antiseptic oral rinse  7 mL Mouth Rinse QID  . chlorhexidine  15 mL Mouth Rinse BID  . folic acid  1 mg Oral Daily  . furosemide  100 mg Intravenous BID  . lactulose  20 g Oral BID  . levofloxacin (LEVAQUIN) IV  750 mg Intravenous Q48H  . pantoprazole (PROTONIX) IV  40 mg Intravenous BID  . sodium chloride  3 mL Intravenous Q12H   Continuous: . sodium chloride 100 mL/hr at 03/01/14 1051  . fentaNYL infusion INTRAVENOUS 50 mcg/hr (03/01/14 0600)  . norepinephrine (LEVOPHED) Adult infusion 5 mcg/min (03/01/14 0600)   PRN:fentaNYL, fentaNYL, fentaNYL, LORazepam, morphine injection, sodium chloride  Assesment:he was admitted with GI bleeding and hemorrhagic shock from that. He was intubated for airway protection. He has required pressors for hypotension. He is still on pressors. He is tachycardic. He is not ready for extubation  right now  Principal Problem:   Upper GI bleed Active Problems:   Abdominal pain   Hepatitis C   Esophageal varices   Cirrhosis   GIB (gastrointestinal bleeding)   Hemorrhagic shock   Acute renal failure   ATN (acute tubular necrosis)    Plan:continue current treatments. Try to wean him off the pressors and if he meets weaning criteria we can get him off later today    LOS: 4 days   HAWKINS,EDWARD L 03/01/2014, 10:53 AM   

## 2014-03-01 NOTE — Progress Notes (Signed)
Subjective: Interval History: none.  Objective: Vital signs in last 24 hours: Temp:  [97.7 F (36.5 C)-99.1 F (37.3 C)] 98 F (36.7 C) (11/07 0500) Pulse Rate:  [77-126] 89 (11/07 0731) Resp:  [8-29] 19 (11/07 0731) BP: (71-118)/(40-80) 94/46 mmHg (11/07 0700) SpO2:  [96 %-100 %] 99 % (11/07 0731) FiO2 (%):  [40 %] 40 % (11/07 0751) Weight:  [86 kg (189 lb 9.5 oz)] 86 kg (189 lb 9.5 oz) (11/07 0500) Weight change: 12.8 kg (28 lb 3.5 oz)  Intake/Output from previous day: 11/06 0701 - 11/07 0700 In: 3622.6 [I.V.:3322.6; IV Piggyback:300] Out: 900 [Urine:900] Intake/Output this shift:   Generally patient remains intubated. Patient is more alert but agitated Chest decreased breath sound bilaterally. Heart exam revealed regular rate and rhythm, no murmur Abdomen: Distend and tight on examination,and positive bowl sound Extremities:  trace edema   Lab Results:  Recent Labs  02/28/14 0159 02/28/14 0805 03/01/14 0613  WBC 8.7  --  6.7  HGB 9.9* 10.3* 8.9*  HCT 29.4* 30.0* 26.8*  PLT 68*  --  49*   BMET:   Recent Labs  02/28/14 0159 03/01/14 0613  NA 138 143  K 4.4 4.5  CL 109 112  CO2 19 18*  GLUCOSE 88 85  BUN 62* 65*  CREATININE 2.56* 2.85*  CALCIUM 7.3* 7.7*   No results for input(s): PTH in the last 72 hours. Iron Studies: No results for input(s): IRON, TIBC, TRANSFERRIN, FERRITIN in the last 72 hours.  Studies/Results: Dg Chest Port 1 View  02/28/2014   CLINICAL DATA:  Acute respiratory failure  EXAM: PORTABLE CHEST - 1 VIEW  COMPARISON:  02/27/2014  FINDINGS: Endotracheal tube is noted 6 cm above the carina and stable in appearance. A left jugular line is again seen in the left innominate vein. Postsurgical changes in the left clavicle are noted. The cardiac shadow is stable. The overall inspiratory effort is poor with right basilar atelectasis and crowding of the vascular markings. No acute bony abnormality is noted.  IMPRESSION: Right basilar  atelectasis.   Electronically Signed   By: Alcide CleverMark  Lukens M.D.   On: 02/28/2014 06:57    I have reviewed the patient's current medications.  Assessment/Plan: Problem #1 acute kidney injury: Possibly prerenal versus ATN. Presently his renal function is increasing. Patient on lasix and albumin . He is none oliguric but his urine out put is still low Problem #2 hypotension: Most likely from hemorrhagic shock. His systolic blood pressure still low.patient on pressure support. Problem #3 history of liver cirrhosis. He ha ascites. Problem #4 history of hepatitis C Problem #5 a trial fibrillation: His heart rate is controlled Problem #6 patient initially intubated for airway protection. Presently he remains intubated. Problem #7 anemia: s/p blood transfussion his hemoglobin is low and declining today Plan: We'll continue with slow hydration Will start patient on Lasix 100 mg IV twice a day Basic metabolic panel in am    LOS: 4 days   Andrews Tener S 03/01/2014,8:46 AM

## 2014-03-01 NOTE — Progress Notes (Signed)
  Subjective: Patient is intubated and unable to provide any history. Patient's brother and father at bedside. No melena or rectal bleeding reported.   Objective: Blood pressure 91/52, pulse 93, temperature 97.1 F (36.2 C), temperature source Axillary, resp. rate 20, height 5\' 8"  (1.727 m), weight 189 lb 9.5 oz (86 kg), SpO2 100 %. Patient is on ventilatory support. He is drowsy but does not appear to be restless. Conjunctiva is pale. Sclera is icteric No neck masses or thyromegaly noted. Cardiac exam with regular rhythm normal S1 and S2. No murmur or gallop noted. Lungs are clear to auscultation. Abdomen is full. Bowel sounds are present. No tenderness noted. Liver and spleen not palpable. He has pitting edema along posterolateral aspects of chest and flanks. He has trace edema around ankles.  Labs/studies Results:   Recent Labs  02/27/14 0159  02/28/14 0159 02/28/14 0805 03/01/14 0613  WBC 9.6  --  8.7  --  6.7  HGB 10.4*  < > 9.9* 10.3* 8.9*  HCT 30.3*  < > 29.4* 30.0* 26.8*  PLT PLATELET CLUMPS NOTED ON SMEAR, COUNT APPEARS DECREASED  --  68*  --  49*  < > = values in this interval not displayed.  BMET   Recent Labs  02/27/14 0159 02/28/14 0159 03/01/14 0613  NA 137 138 143  K 4.3 4.4 4.5  CL 107 109 112  CO2 22 19 18*  GLUCOSE 90 88 85  BUN 59* 62* 65*  CREATININE 2.60* 2.56* 2.85*  CALCIUM 7.2* 7.3* 7.7*    LFT   Recent Labs  02/27/14 0159  PROT 4.8*  ALBUMIN 2.0*  AST 231*  ALT 98*  ALKPHOS 75  BILITOT 4.5*    PT/INR   Recent Labs  02/28/14 0159  LABPROT 22.5*  INR 1.96*     Assessment:  #1. esophageal variceal bleed. Status post banding on 2013-10-20 by Dr. Darrick PennaFields. No evidence of recurrent bleeding.he remains on IV pantoprazole. #2. 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 MELD core is 30 based on bilirubin of 2 days ago which carries 3 months motility of 60-80%. #3. Anemia multifactorial but  primarily secondary to GI bleed. #4. Respiratory failure. Patient was intubated for airway protection prior to EGD. He is not bradycardia for extubation until hemodynamically stable. #5. Hypotension. Patient is still requiring levophed.he remains on IV Levaquin for presumed infection. #6. Acute renal failure. He is nonoliguric. Kidney injury possibly due to hypotension but could also be secondary to advanced liver disease. #7. Nutritional status is poor. He is presently not on nutritional support. NG feeding on hold because of recent banding.  Recommendations;  Will continue to monitor H&H. Begin oral feeding as soon as patient extubated.

## 2014-03-01 NOTE — Plan of Care (Signed)
Problem: Consults Goal: Ventilated Patients Patient Education See Patient Education Module for education specifics.  Outcome: Progressing Goal: Skin Care Protocol Initiated - if Braden Score 18 or less If consults are not indicated, leave blank or document N/A  Outcome: Progressing  Problem: Phase I Progression Outcomes Goal: VTE prophylaxis Outcome: Completed/Met Date Met:  03/01/14 Goal: GIProphysixis Outcome: Completed/Met Date Met:  03/01/14 Goal: Oral Care per Protocol Outcome: Progressing Goal: HOB elevated 30 degrees Outcome: Completed/Met Date Met:  03/01/14 Goal: VAP prevention protocol initiated Outcome: Completed/Met Date Met:  03/01/14 Goal: Sedation Protocol initiated if indicated Outcome: Progressing Goal: Pain controlled with appropriate interventions Outcome: Progressing Goal: Hemodynamically stable Outcome: Progressing Goal: Baseline oxygen/pH stable Outcome: Progressing Goal: Initial discharge plan identified Outcome: Progressing

## 2014-03-02 ENCOUNTER — Inpatient Hospital Stay (HOSPITAL_COMMUNITY): Payer: Medicaid Other

## 2014-03-02 DIAGNOSIS — I517 Cardiomegaly: Secondary | ICD-10-CM

## 2014-03-02 LAB — CBC
HCT: 26.3 % — ABNORMAL LOW (ref 39.0–52.0)
HEMOGLOBIN: 8.7 g/dL — AB (ref 13.0–17.0)
MCH: 29.9 pg (ref 26.0–34.0)
MCHC: 33.1 g/dL (ref 30.0–36.0)
MCV: 90.4 fL (ref 78.0–100.0)
PLATELETS: 49 10*3/uL — AB (ref 150–400)
RBC: 2.91 MIL/uL — AB (ref 4.22–5.81)
RDW: 17.7 % — ABNORMAL HIGH (ref 11.5–15.5)
WBC: 5.6 10*3/uL (ref 4.0–10.5)

## 2014-03-02 LAB — BLOOD GAS, ARTERIAL
Acid-base deficit: 8.1 mmol/L — ABNORMAL HIGH (ref 0.0–2.0)
Bicarbonate: 16.6 mEq/L — ABNORMAL LOW (ref 20.0–24.0)
Drawn by: 38235
FIO2: 0.35 %
LHR: 14 {breaths}/min
MECHVT: 550 mL
O2 SAT: 98.1 %
PATIENT TEMPERATURE: 37
PEEP: 5 cmH2O
TCO2: 15.6 mmol/L (ref 0–100)
pCO2 arterial: 32.2 mmHg — ABNORMAL LOW (ref 35.0–45.0)
pH, Arterial: 7.333 — ABNORMAL LOW (ref 7.350–7.450)
pO2, Arterial: 114 mmHg — ABNORMAL HIGH (ref 80.0–100.0)

## 2014-03-02 LAB — COMPREHENSIVE METABOLIC PANEL
ALT: 41 U/L (ref 0–53)
ANION GAP: 13 (ref 5–15)
AST: 51 U/L — ABNORMAL HIGH (ref 0–37)
Albumin: 2.8 g/dL — ABNORMAL LOW (ref 3.5–5.2)
Alkaline Phosphatase: 56 U/L (ref 39–117)
BUN: 69 mg/dL — AB (ref 6–23)
CALCIUM: 8.1 mg/dL — AB (ref 8.4–10.5)
CO2: 19 meq/L (ref 19–32)
Chloride: 113 mEq/L — ABNORMAL HIGH (ref 96–112)
Creatinine, Ser: 2.89 mg/dL — ABNORMAL HIGH (ref 0.50–1.35)
GFR calc non Af Amer: 22 mL/min — ABNORMAL LOW (ref >90)
GFR, EST AFRICAN AMERICAN: 26 mL/min — AB (ref >90)
GLUCOSE: 89 mg/dL (ref 70–99)
Potassium: 4 mEq/L (ref 3.7–5.3)
Sodium: 145 mEq/L (ref 137–147)
Total Bilirubin: 7.8 mg/dL — ABNORMAL HIGH (ref 0.3–1.2)
Total Protein: 5 g/dL — ABNORMAL LOW (ref 6.0–8.3)

## 2014-03-02 LAB — AMMONIA: Ammonia: 66 umol/L — ABNORMAL HIGH (ref 11–60)

## 2014-03-02 LAB — LACTIC ACID, PLASMA: LACTIC ACID, VENOUS: 1.5 mmol/L (ref 0.5–2.2)

## 2014-03-02 MED ORDER — FUROSEMIDE 10 MG/ML IJ SOLN
160.0000 mg | Freq: Two times a day (BID) | INTRAVENOUS | Status: DC
  Administered 2014-03-02 – 2014-03-05 (×7): 160 mg via INTRAVENOUS
  Filled 2014-03-02 (×8): qty 16

## 2014-03-02 MED ORDER — PHENYLEPHRINE HCL 10 MG/ML IJ SOLN
0.0000 ug/min | INTRAMUSCULAR | Status: DC
  Administered 2014-03-02 (×2): 20 ug/min via INTRAVENOUS
  Administered 2014-03-03: 30 ug/min via INTRAVENOUS
  Administered 2014-03-03: 10 ug/min via INTRAVENOUS
  Filled 2014-03-02 (×3): qty 1

## 2014-03-02 NOTE — Progress Notes (Signed)
Subjective: He remains intubated on the ventilator and on pressors. When pressors support is reduced he drops his blood pressure into the 70s and actually dropped his heart rate last night as well. During his wakeup assessment is generally pretty agitated and becomes tachycardic.  Objective: Vital signs in last 24 hours: Temp:  [96.9 F (36.1 C)-98.3 F (36.8 C)] 97.6 F (36.4 C) (11/08 0759) Pulse Rate:  [72-120] 81 (11/08 0700) Resp:  [11-34] 19 (11/08 0700) BP: (71-103)/(40-71) 93/54 mmHg (11/08 0700) SpO2:  [87 %-100 %] 96 % (11/08 0835) FiO2 (%):  [35 %-40 %] 35 % (11/08 0850) Weight:  [84.2 kg (185 lb 10 oz)] 84.2 kg (185 lb 10 oz) (11/08 0500) Weight change: -1.8 kg (-3 lb 15.5 oz) Last BM Date: 02/26/14  Intake/Output from previous day: 11/07 0701 - 11/08 0700 In: 3316.1 [I.V.:2906.1; IV Piggyback:410] Out: 6283 [Urine:1650]  PHYSICAL EXAM General appearance: intubated sedated on the ventilator Resp: rhonchi bilaterally Cardio: regular rate and rhythm, S1, S2 normal, no murmur, click, rub or gallop GI: soft, non-tender; bowel sounds normal; no masses,  no organomegaly Extremities: extremities normal, atraumatic, no cyanosis or edema  Lab Results:  Results for orders placed or performed during the hospital encounter of 02/28/2014 (from the past 48 hour(s))  Glucose, capillary     Status: None   Collection Time: 02/28/14 11:02 AM  Result Value Ref Range   Glucose-Capillary 81 70 - 99 mg/dL   Comment 1 Documented in Chart    Comment 2 Notify RN   Basic metabolic panel     Status: Abnormal   Collection Time: 03/01/14  6:13 AM  Result Value Ref Range   Sodium 143 137 - 147 mEq/L   Potassium 4.5 3.7 - 5.3 mEq/L   Chloride 112 96 - 112 mEq/L   CO2 18 (L) 19 - 32 mEq/L   Glucose, Bld 85 70 - 99 mg/dL   BUN 65 (H) 6 - 23 mg/dL   Creatinine, Ser 2.85 (H) 0.50 - 1.35 mg/dL   Calcium 7.7 (L) 8.4 - 10.5 mg/dL   GFR calc non Af Amer 23 (L) >90 mL/min   GFR calc Af Amer 26  (L) >90 mL/min    Comment: (NOTE) The eGFR has been calculated using the CKD EPI equation. This calculation has not been validated in all clinical situations. eGFR's persistently <90 mL/min signify possible Chronic Kidney Disease.    Anion gap 13 5 - 15  CBC with Differential     Status: Abnormal   Collection Time: 03/01/14  6:13 AM  Result Value Ref Range   WBC 6.7 4.0 - 10.5 K/uL   RBC 2.97 (L) 4.22 - 5.81 MIL/uL   Hemoglobin 8.9 (L) 13.0 - 17.0 g/dL   HCT 26.8 (L) 39.0 - 52.0 %   MCV 90.2 78.0 - 100.0 fL   MCH 30.0 26.0 - 34.0 pg   MCHC 33.2 30.0 - 36.0 g/dL   RDW 17.6 (H) 11.5 - 15.5 %   Platelets 49 (L) 150 - 400 K/uL    Comment: DELTA CHECK NOTED REPEATED TO VERIFY SPECIMEN CHECKED FOR CLOTS PLATELET COUNT CONFIRMED BY SMEAR    Neutrophils Relative % 64 43 - 77 %   Neutro Abs 4.3 1.7 - 7.7 K/uL   Lymphocytes Relative 13 12 - 46 %   Lymphs Abs 0.8 0.7 - 4.0 K/uL   Monocytes Relative 19 (H) 3 - 12 %   Monocytes Absolute 1.2 (H) 0.1 - 1.0 K/uL   Eosinophils  Relative 4 0 - 5 %   Eosinophils Absolute 0.3 0.0 - 0.7 K/uL   Basophils Relative 1 0 - 1 %   Basophils Absolute 0.0 0.0 - 0.1 K/uL  Ammonia     Status: Abnormal   Collection Time: 03/01/14  9:57 AM  Result Value Ref Range   Ammonia 68 (H) 11 - 60 umol/L  Hemoglobin and hematocrit, blood     Status: Abnormal   Collection Time: 03/01/14  1:50 PM  Result Value Ref Range   Hemoglobin 8.8 (L) 13.0 - 17.0 g/dL   HCT 26.0 (L) 39.0 - 52.0 %  CBC     Status: Abnormal   Collection Time: 03/02/14  4:20 AM  Result Value Ref Range   WBC 5.6 4.0 - 10.5 K/uL   RBC 2.91 (L) 4.22 - 5.81 MIL/uL   Hemoglobin 8.7 (L) 13.0 - 17.0 g/dL   HCT 26.3 (L) 39.0 - 52.0 %   MCV 90.4 78.0 - 100.0 fL   MCH 29.9 26.0 - 34.0 pg   MCHC 33.1 30.0 - 36.0 g/dL   RDW 17.7 (H) 11.5 - 15.5 %   Platelets 49 (L) 150 - 400 K/uL    Comment: PLATELET COUNT CONFIRMED BY SMEAR SPECIMEN CHECKED FOR CLOTS REPEATED TO VERIFY   Ammonia     Status:  Abnormal   Collection Time: 03/02/14  4:20 AM  Result Value Ref Range   Ammonia 66 (H) 11 - 60 umol/L  Blood gas, arterial     Status: Abnormal   Collection Time: 03/02/14  4:37 AM  Result Value Ref Range   FIO2 0.35 %   Delivery systems VENTILATOR    Mode PRESSURE REGULATED VOLUME CONTROL    VT 550 mL   Rate 14 resp/min   Peep/cpap 5.0 cm H20   pH, Arterial 7.333 (L) 7.350 - 7.450   pCO2 arterial 32.2 (L) 35.0 - 45.0 mmHg   pO2, Arterial 114.0 (H) 80.0 - 100.0 mmHg   Bicarbonate 16.6 (L) 20.0 - 24.0 mEq/L   TCO2 15.6 0 - 100 mmol/L   Acid-base deficit 8.1 (H) 0.0 - 2.0 mmol/L   O2 Saturation 98.1 %   Patient temperature 37.0    Collection site RIGHT RADIAL    Drawn by [phone]    Sample type ARTERIAL DRAW    Allens test (pass/fail) PASS PASS  Comprehensive metabolic panel     Status: Abnormal   Collection Time: 03/02/14  8:36 AM  Result Value Ref Range   Sodium 145 137 - 147 mEq/L   Potassium 4.0 3.7 - 5.3 mEq/L   Chloride 113 (H) 96 - 112 mEq/L   CO2 19 19 - 32 mEq/L   Glucose, Bld 89 70 - 99 mg/dL   BUN 69 (H) 6 - 23 mg/dL   Creatinine, Ser 2.89 (H) 0.50 - 1.35 mg/dL   Calcium 8.1 (L) 8.4 - 10.5 mg/dL   Total Protein 5.0 (L) 6.0 - 8.3 g/dL   Albumin 2.8 (L) 3.5 - 5.2 g/dL   AST 51 (H) 0 - 37 U/L   ALT 41 0 - 53 U/L   Alkaline Phosphatase 56 39 - 117 U/L   Total Bilirubin 7.8 (H) 0.3 - 1.2 mg/dL   GFR calc non Af Amer 22 (L) >90 mL/min   GFR calc Af Amer 26 (L) >90 mL/min    Comment: (NOTE) The eGFR has been calculated using the CKD EPI equation. This calculation has not been validated in all clinical situations. eGFR's persistently <  90 mL/min signify possible Chronic Kidney Disease.    Anion gap 13 5 - 15  Lactic acid, plasma     Status: None   Collection Time: 03/02/14  8:36 AM  Result Value Ref Range   Lactic Acid, Venous 1.5 0.5 - 2.2 mmol/L    ABGS  Recent Labs  03/02/14 0437  PHART 7.333*  PO2ART 114.0*  TCO2 15.6  HCO3 16.6*   CULTURES Recent  Results (from the past 240 hour(s))  MRSA PCR Screening     Status: None   Collection Time: 03/07/2014  2:15 PM  Result Value Ref Range Status   MRSA by PCR NEGATIVE NEGATIVE Final    Comment:        The GeneXpert MRSA Assay (FDA approved for NASAL specimens only), is one component of a comprehensive MRSA colonization surveillance program. It is not intended to diagnose MRSA infection nor to guide or monitor treatment for MRSA infections.    Studies/Results: Dg Chest Port 1 View  03/02/2014   CLINICAL DATA:  Respiratory failure  EXAM: PORTABLE CHEST - 1 VIEW  COMPARISON:  03/01/2014  FINDINGS: Endotracheal tube is appropriately positioned. Left IJ central venous catheter is noted with tip over the brachiocephalic/SVC confluence. Left clavicular side plate and screws reidentified. Lungs are hypoaerated with crowding of the bronchovascular markings. Lung volumes are lower than on the prior exam with increased bibasilar atelectasis. Heart size is normal.  IMPRESSION: Decreased aeration with bilateral lower lobe atelectasis.   Electronically Signed   By: Conchita Paris M.D.   On: 03/02/2014 09:37   Dg Chest Port 1 View  03/01/2014   CLINICAL DATA:  Respiratory failure.  EXAM: PORTABLE CHEST - 1 VIEW  COMPARISON:  02/28/2014  FINDINGS: There is an endotracheal tube with tip between the clavicular heads and carina. Left IJ catheter, tip at the distal left brachiocephalic vein.  Normal heart size and mediastinal contours.  There is diffuse interstitial coarsening, accentuated by low lung volumes. Atelectasis in the right lower lobe has mildly improved. No effusion or pneumothorax. There is diffuse interstitial coarsening which is likely venous congestion.  IMPRESSION: 1. Stable positioning of endotracheal tube and central line. 2. Mildly improved right basilar atelectasis.   Electronically Signed   By: Jorje Guild M.D.   On: 03/01/2014 10:16    Medications:  Prior to Admission:  Prescriptions  prior to admission  Medication Sig Dispense Refill Last Dose  . folic acid (FOLVITE) 1 MG tablet Take 1 tablet (1 mg total) by mouth daily. 30 tablet 1 Past Week at Unknown time  . furosemide (LASIX) 20 MG tablet Take 1 tablet (20 mg total) by mouth daily. 30 tablet 3 Past Week at Unknown time  . Lactulose 20 GM/30ML SOLN Take 30 mLs (20 g total) by mouth 2 (two) times daily. 240 mL 3 Past Week at Unknown time  . metoprolol (LOPRESSOR) 50 MG tablet Take 0.5 tablets (25 mg total) by mouth daily. 60 tablet 11 Past Week at Unknown time  . Multiple Vitamins-Minerals (CENTRUM SILVER PO) Take 1 tablet by mouth daily.   Past Week at Unknown time  . ondansetron (ZOFRAN) 4 MG tablet Take 4 mg by mouth every 8 (eight) hours as needed for nausea or vomiting.   Past Week at Unknown time  . oxazepam (SERAX) 10 MG capsule Take 10 mg by mouth 2 (two) times daily.   Past Week at Unknown time  . oxyCODONE (OXY IR/ROXICODONE) 5 MG immediate release tablet Take 1 tablet (  5 mg total) by mouth every 4 (four) hours as needed for severe pain. 100 tablet 0 Past Week at Unknown time  . pantoprazole (PROTONIX) 40 MG tablet Take 1 tablet (40 mg total) by mouth 2 (two) times daily before a meal. 30 tablet 1 Past Week at Unknown time  . Rivaroxaban (XARELTO) 15 MG TABS tablet Take 1 tablet (15 mg total) by mouth daily with breakfast. 30 tablet 1 Past Week at Unknown time  . spironolactone (ALDACTONE) 50 MG tablet Take 1 tablet (50 mg total) by mouth daily. 30 tablet 3 Past Week at Unknown time   Scheduled: . antiseptic oral rinse  7 mL Mouth Rinse QID  . chlorhexidine  15 mL Mouth Rinse BID  . folic acid  1 mg Oral Daily  . furosemide  160 mg Intravenous BID  . lactulose  20 g Oral BID  . levofloxacin (LEVAQUIN) IV  750 mg Intravenous Q48H  . pantoprazole (PROTONIX) IV  40 mg Intravenous BID  . sodium chloride  3 mL Intravenous Q12H   Continuous: . sodium chloride 100 mL/hr at 03/02/14 0800  . fentaNYL infusion  INTRAVENOUS 50 mcg/hr (03/02/14 0600)  . norepinephrine (LEVOPHED) Adult infusion 2.987 mcg/min (03/02/14 0800)  . phenylephrine (NEO-SYNEPHRINE) Adult infusion     XMD:[id number], fentaNYL, fentaNYL, LORazepam, morphine injection, sodium chloride  Assesment:he was admitted with upper GI bleeding and hemorrhagic shock. He underwent emergency EGD and had treatment for esophageal varices. He was intubated for airway protection but because of his shock and his  Continued hypotension and continued need for pressor support he has not been able to come off the ventilator. He is on albumin because of his nutritional status. Principal Problem:   Upper GI bleed Active Problems:   Abdominal pain   Hepatitis C   Esophageal varices   Cirrhosis   GIB (gastrointestinal bleeding)   Hemorrhagic shock   Acute renal failure   ATN (acute tubular necrosis)    Plan:because we've had some much difficulty with levo fed I'm going to switch him to Neo-Synephrine and see if this works any better as a pressor. We may be able to get him off of this a little bit easier. His fentanyl drip is probably causing some trouble with his blood pressure and it does not seem to be totally controlling his need for sedation. He does have malnutrition and I don't know for can start feedings at this point or not    LOS: 5 days   Mardel Grudzien L 03/02/2014, 10:04 AM

## 2014-03-02 NOTE — Progress Notes (Signed)
2 D echo completed 

## 2014-03-02 NOTE — Progress Notes (Addendum)
Subjective: Interval History: Patient lethargic still does not communicate  Objective: Vital signs in last 24 hours: Temp:  [96.9 F (36.1 C)-98.3 F (36.8 C)] 97.6 F (36.4 C) (11/08 0759) Pulse Rate:  [72-120] 81 (11/08 0700) Resp:  [11-34] 19 (11/08 0700) BP: (71-103)/(40-71) 93/54 mmHg (11/08 0700) SpO2:  [87 %-100 %] 100 % (11/08 0700) FiO2 (%):  [35 %-40 %] 35 % (11/08 0424) Weight:  [84.2 kg (185 lb 10 oz)] 84.2 kg (185 lb 10 oz) (11/08 0500) Weight change: -1.8 kg (-3 lb 15.5 oz)  Intake/Output from previous day: 11/07 0701 - 11/08 0700 In: 3316.1 [I.V.:2906.1; IV Piggyback:410] Out: 1650 [Urine:1650] Intake/Output this shift:   Generally patient remains intubated. Lethargic Chest decreased breath sound bilaterally. Heart exam revealed regular rate and rhythm, no murmur Abdomen: Distend and tight on examination,and positive bowl sound Extremities:  trace edema   Lab Results:  Recent Labs  03/01/14 0613 03/01/14 1350 03/02/14 0420  WBC 6.7  --  5.6  HGB 8.9* 8.8* 8.7*  HCT 26.8* 26.0* 26.3*  PLT 49*  --  49*   BMET:   Recent Labs  02/28/14 0159 03/01/14 0613  NA 138 143  K 4.4 4.5  CL 109 112  CO2 19 18*  GLUCOSE 88 85  BUN 62* 65*  CREATININE 2.56* 2.85*  CALCIUM 7.3* 7.7*   No results for input(s): PTH in the last 72 hours. Iron Studies: No results for input(s): IRON, TIBC, TRANSFERRIN, FERRITIN in the last 72 hours.  Studies/Results: Dg Chest Port 1 View  03/01/2014   CLINICAL DATA:  Respiratory failure.  EXAM: PORTABLE CHEST - 1 VIEW  COMPARISON:  02/28/2014  FINDINGS: There is an endotracheal tube with tip between the clavicular heads and carina. Left IJ catheter, tip at the distal left brachiocephalic vein.  Normal heart size and mediastinal contours.  There is diffuse interstitial coarsening, accentuated by low lung volumes. Atelectasis in the right lower lobe has mildly improved. No effusion or pneumothorax. There is diffuse interstitial  coarsening which is likely venous congestion.  IMPRESSION: 1. Stable positioning of endotracheal tube and central line. 2. Mildly improved right basilar atelectasis.   Electronically Signed   By: Tiburcio PeaJonathan  Watts M.D.   On: 03/01/2014 10:16    I have reviewed the patient's current medications.  Assessment/Plan: Problem #1 acute kidney injury: Possibly prerenal versus ATN. His renal function continue to get worst. His urine out put how ever is getting  better. He had about 1500 cc over the last 24 hours Problem #2 hypotension: Most likely from hemorrhagic shock. His systolic blood pressure still low,. seems better. Problem #3 history of liver cirrhosis. Patient with ascites but no significant edema Problem #4 history of hepatitis C Problem #5 a trial fibrillation: His heart rate is controlled Problem #6 patient initially intubated for airway protection. Presently he remains intubated. Problem #7 anemia: s/p blood transfussion his hemoglobin is low and declining today Plan:1] We'll continue with slow hydration 2]We will increase his  Lasix 160 mg IV twice a day 3]Basic metabolic panel in am    LOS: 5 days   Shanessa Hodak S 03/02/2014,8:27 AM

## 2014-03-02 NOTE — Progress Notes (Signed)
[facility] PROGRESS NOTE  Nelle DonHerbert E Brunkhorst WUJ:[id number] DOB: 06/20/1955 DOA: 03/07/2014 PCP: Augustine RadarOBERSON, KRISTINA, MD  Summary:  This is a 58 year old gentleman with known history of alcoholic/hepatitis C cirrhosis and varices presents to the hospital with GI bleeding and abdominal pain. Shortly after admission, he required intubation for airway protection and became hypotensive requiring vasopressors. He was transfused PRBCs and had urgent endoscopy done which showed GI bleed due to esophageal varix.he is currently on a octreotide infusion and is continued on proton pump inhibitors. It is felt that if he rebleeds, he will likely require TIPS procedure. He currently remains on ventilator.  Assessment/Plan: 1. Acute esophageal variceal GI bleeding. Status post endoscopy with results noted as below. He was transfused 4 units of PRBCs and 2 units of FFP. Hemoglobin appears to be stable. He has completed course of octreotide infusion, continue proton pump inhibitors. Bleeding was further complicated by anticoagulation which is currently on hold. Continue to monitor hemoglobin. Since patient was having significant hematemesis on admission, he did require intubation for airway protection. Appreciate pulmonology assistance with vent management. He needs to be hemodynamically stable prior to extubation. 2. Acute blood loss anemia. Transfused 4 units PRBCs and 2 units of FFP. Continue to follow hemoglobin. Hemoglobin has trended down, but appears to have stabilized with no reported evidence of bleeding. May be dilutional. Continue to follow. 3. Hemorrhagic shock. Related to #1 and #2. Still requiring levophed to maintain blood pressure. Attempts are being made to wean this off. Check echocardiogram to assess LVEF and check blood cultures 4. History of alcoholic/hepatitis C cirrhosis. He has completed 7 days of IV levaquin. Will discontinue per recommendations from GI. He is developing ascites with on going  hydration and low albumin state. May need to have paracentesis prior to discharge. On albumin infusions and lasix. Monitor urine output. Based on current labs, MELD score appears to be 32. 5. History of portal vein thrombosis. Not a candidate for anticoagulation. 6. History of paroxysmal atrial fibrillation. Not a candidate for anticoagulation 7. 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 Patient is currently receiving vitamin K and INR is improving. 8. Acute renal failure. Appreciate nephrology assistance. Felt to be ATN related to hypotension. Creatinine has mildly trended up today. He is on intravenous fluids, albumin and lasix. Plans are to increase lasix dose today. Continue to follow urine output.  Code Status: full code Family Communication: discussed with daughter over the phone Disposition Plan: pending further hospital course   Consultants:  GI  Pulmonology  nephrology  Procedures: EGD: ESOPHAGUS: There were 5 columns of large varices in the lower third of the esophagus. There WAS ONE VARIX WITH evidence of a white nipple sign. 4 BANDING SITES IN DISTAL ESOPHAGUS. COMPLICATIONS: There were no immediate complications. PT VOMITED  BLOOD DURING PROCEDURE.  Antibiotics:  levaquin 03/06/2014>>03/02/14  HPI/Subjective: Patient is intubated and sedated. Still requiring levophed to support blood pressure   Objective: Filed Vitals:   03/02/14 0759  BP:   Pulse:   Temp: 97.6 F (36.4 C)  Resp:     Intake/Output Summary (Last 24 hours) at 03/02/14 0919 Last data filed at 03/02/14 0600  Gross per 24 hour  Intake 3316.06 ml  Output   1650 ml  Net 1666.06 ml   Filed Weights   02/28/14 0500 03/01/14 0500 03/02/14 0500  Weight: 73.2 kg (161 lb 6 oz) 86 kg (189 lb 9.5 oz) 84.2 kg (185 lb 10 oz)    Exam:  General:  Sedated, on vent  Cardiovascular: s1, s2 rrr  Respiratory: cta b  Abdomen: soft, nt, distended,  bs+  Musculoskeletal: trace edema b/l   Data Reviewed: Basic Metabolic Panel:  Recent Labs Lab 02/26/14 0828 02/27/14 0159 02/28/14 0159 03/01/14 0613 03/02/14 0836  NA 139 137 138 143 145  K 4.4 4.3 4.4 4.5 4.0  CL 108 107 109 112 113*  CO2 23 22 19  18* 19  GLUCOSE 114* 90 88 85 89  BUN 59* 59* 62* 65* 69*  CREATININE 2.58* 2.60* 2.56* 2.85* 2.89*  CALCIUM 6.9* 7.2* 7.3* 7.7* 8.1*   Liver Function Tests:  Recent Labs Lab 03/27/14 1040 02/26/14 0828 02/27/14 0159 03/02/14 0836  AST 206* 186* 231* 51*  ALT 71* 72* 98* 41  ALKPHOS 82 80 75 56  BILITOT 4.3* 4.5* 4.5* 7.8*  PROT 4.9* 4.5* 4.8* 5.0*  ALBUMIN 1.9* 1.9* 2.0* 2.8*    Recent Labs Lab 03-27-14 1040  LIPASE 44    Recent Labs Lab 03/27/14 1040 03/01/14 0957 03/02/14 0420  AMMONIA 45 68* 66*   CBC:  Recent Labs Lab March 27, 2014 1040  02/27/14 0159  02/28/14 0159 02/28/14 0805 03/01/14 0613 03/01/14 1350 03/02/14 0420  WBC 12.1*  --  9.6  --  8.7  --  6.7  --  5.6  NEUTROABS 9.8*  --   --   --   --   --  4.3  --   --   HGB 7.0*  < > 10.4*  < > 9.9* 10.3* 8.9* 8.8* 8.7*  HCT 21.3*  < > 30.3*  < > 29.4* 30.0* 26.8* 26.0* 26.3*  MCV 94.7  --  87.1  --  89.1  --  90.2  --  90.4  PLT 94*  --  PLATELET CLUMPS NOTED ON SMEAR, COUNT APPEARS DECREASED  --  68*  --  49*  --  49*  < > = values in this interval not displayed. Cardiac Enzymes: No results for input(s): CKTOTAL, CKMB, CKMBINDEX, TROPONINI in the last 168 hours. BNP (last 3 results) No results for input(s): PROBNP in the last 8760 hours. CBG:  Recent Labs Lab 02/28/14 1102  GLUCAP 81    Recent Results (from the past 240 hour(s))  MRSA PCR Screening     Status: None   Collection Time: 2014-03-27  2:15 PM  Result Value Ref Range Status   MRSA by PCR NEGATIVE NEGATIVE Final    Comment:        The GeneXpert MRSA Assay (FDA approved for NASAL specimens only), is one component of a comprehensive MRSA colonization surveillance  program. It is not intended to diagnose MRSA infection nor to guide or monitor treatment for MRSA infections.      Studies: Dg Chest Port 1 View  03/01/2014   CLINICAL DATA:  Respiratory failure.  EXAM: PORTABLE CHEST - 1 VIEW  COMPARISON:  02/28/2014  FINDINGS: There is an endotracheal tube with tip between the clavicular heads and carina. Left IJ catheter, tip at the distal left brachiocephalic vein.  Normal heart size and mediastinal contours.  There is diffuse interstitial coarsening, accentuated by low lung volumes. Atelectasis in the right lower lobe has mildly improved. No effusion or pneumothorax. There is diffuse interstitial coarsening which is likely venous congestion.  IMPRESSION: 1. Stable positioning of endotracheal tube and central line. 2. Mildly improved right basilar atelectasis.   Electronically Signed   By: Tiburcio Pea M.D.   On: 03/01/2014 10:16    Scheduled  Meds: . antiseptic oral rinse  7 mL Mouth Rinse QID  . chlorhexidine  15 mL Mouth Rinse BID  . folic acid  1 mg Oral Daily  . furosemide  160 mg Intravenous BID  . lactulose  20 g Oral BID  . levofloxacin (LEVAQUIN) IV  750 mg Intravenous Q48H  . pantoprazole (PROTONIX) IV  40 mg Intravenous BID  . sodium chloride  3 mL Intravenous Q12H   Continuous Infusions: . sodium chloride 100 mL/hr at 03/02/14 0800  . fentaNYL infusion INTRAVENOUS 50 mcg/hr (03/02/14 0600)  . norepinephrine (LEVOPHED) Adult infusion 2.987 mcg/min (03/02/14 0800)  . phenylephrine (NEO-SYNEPHRINE) Adult infusion      Principal Problem:   Upper GI bleed Active Problems:   Abdominal pain   Hepatitis C   Esophageal varices   Cirrhosis   GIB (gastrointestinal bleeding)   Hemorrhagic shock   Acute renal failure   ATN (acute tubular necrosis)    Time spent: critical care time: 30 mins    MEMON,JEHANZEB  [facility] Pager [phone]. If 7PM-7AM, please contact night-coverage at [email or web address], password [email or web address] 03/02/2014,  9:19 AM  LOS: 5 days

## 2014-03-02 NOTE — Progress Notes (Addendum)
  Subjective:  Patient is intubated and unable to provide any history.   Objective: Blood pressure 93/54, pulse 81, temperature 97.6 F (36.4 C), temperature source Axillary, resp. rate 19, height 5\' 8"  (1.727 m), weight 185 lb 10 oz (84.2 kg), SpO2 96 %. Patient is on ventilatory support. Bedside echocardiography is being performed. LV function appears to be normal. He is drowsy Conjunctiva is pale. Sclera is icteric No neck masses or thyromegaly noted. Cardiac exam with regular rhythm normal S1 and S2. No murmur or gallop noted. Lungs are clear to auscultation. Abdomen is distended and somewhat tense. He has small umbilical hernia. He has pitting edema involving upper extremities along posterolateral aspects of chest and flanks. No LE edema noted.  Labs/studies Results:   Recent Labs  02/28/14 0159  03/01/14 0613 03/01/14 1350 03/02/14 0420  WBC 8.7  --  6.7  --  5.6  HGB 9.9*  < > 8.9* 8.8* 8.7*  HCT 29.4*  < > 26.8* 26.0* 26.3*  PLT 68*  --  49*  --  49*  < > = values in this interval not displayed.  BMET   Recent Labs  02/28/14 0159 03/01/14 0613 03/02/14 0836  NA 138 143 145  K 4.4 4.5 4.0  CL 109 112 113*  CO2 19 18* 19  GLUCOSE 88 85 89  BUN 62* 65* 69*  CREATININE 2.56* 2.85* 2.89*  CALCIUM 7.3* 7.7* 8.1*    LFT   Recent Labs  03/02/14 0836  PROT 5.0*  ALBUMIN 2.8*  AST 51*  ALT 41  ALKPHOS 56  BILITOT 7.8*    PT/INR   Recent Labs  02/28/14 0159  LABPROT 22.5*  INR 1.96*     Assessment:  #1. Esophageal variceal bleed. Status post banding on 03/06/2014 by Dr. Darrick PennaFields. No evidence of recurrent bleeding.he remains on IV pantoprazole. #2. 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 MELD core is 32 based on INR of 2 days ago which carries 3 months motility of 60-80%. #3. Anemia multifactorial but primarily secondary to GI bleed.he has received 4 units of PRBCs during this admission. #4. Respiratory  failure. Patient was intubated for airway protection prior to EGD. He is not ready for extubation until hemodynamically stable. #5. Hypotension. Patient is still requiring levophed.he remains on IV Levaquin for presumed infection. #6. Acute renal failure. He is nonoliguric. Kidney injury possibly due to hypotension but could also be secondary to advanced liver disease. #7. Nutritional status is poor. He is presently not on nutritional support. Serum albumin is up because of albumin infusion. Discuss with Dr. Juanetta GoslingHawkins and Dr. Kerry HoughMemon. #8. Ascites is gradually increasing. He will need to be tapped prior to extubation.    Recommendations;  INR and serum ammonia with a.m. Lab. Parenteral nutrition to be started later today. Reassess need for abdominal paracenteses in a.m.       .Marland Kitchen

## 2014-03-03 ENCOUNTER — Inpatient Hospital Stay (HOSPITAL_COMMUNITY): Payer: Medicaid Other

## 2014-03-03 ENCOUNTER — Encounter (HOSPITAL_COMMUNITY): Payer: Self-pay

## 2014-03-03 DIAGNOSIS — J9601 Acute respiratory failure with hypoxia: Secondary | ICD-10-CM

## 2014-03-03 DIAGNOSIS — N170 Acute kidney failure with tubular necrosis: Secondary | ICD-10-CM

## 2014-03-03 DIAGNOSIS — J9600 Acute respiratory failure, unspecified whether with hypoxia or hypercapnia: Secondary | ICD-10-CM | POA: Insufficient documentation

## 2014-03-03 LAB — BODY FLUID CELL COUNT WITH DIFFERENTIAL
Eos, Fluid: 0 %
LYMPHS FL: 25 %
MONOCYTE-MACROPHAGE-SEROUS FLUID: 27 % — AB (ref 50–90)
Neutrophil Count, Fluid: 48 % — ABNORMAL HIGH (ref 0–25)
Total Nucleated Cell Count, Fluid: 108 cu mm (ref 0–1000)

## 2014-03-03 LAB — BLOOD GAS, ARTERIAL
Acid-base deficit: 6.1 mmol/L — ABNORMAL HIGH (ref 0.0–2.0)
BICARBONATE: 17.9 meq/L — AB (ref 20.0–24.0)
Drawn by: 38235
FIO2: 0.3 %
MECHVT: 550 mL
O2 Saturation: 97.8 %
PATIENT TEMPERATURE: 37
PCO2 ART: 29.5 mmHg — AB (ref 35.0–45.0)
PEEP: 5 cmH2O
RATE: 14 resp/min
TCO2: 16.7 mmol/L (ref 0–100)
pH, Arterial: 7.399 (ref 7.350–7.450)
pO2, Arterial: 101 mmHg — ABNORMAL HIGH (ref 80.0–100.0)

## 2014-03-03 LAB — BASIC METABOLIC PANEL
Anion gap: 14 (ref 5–15)
BUN: 69 mg/dL — ABNORMAL HIGH (ref 6–23)
CHLORIDE: 113 meq/L — AB (ref 96–112)
CO2: 19 meq/L (ref 19–32)
Calcium: 7.9 mg/dL — ABNORMAL LOW (ref 8.4–10.5)
Creatinine, Ser: 2.75 mg/dL — ABNORMAL HIGH (ref 0.50–1.35)
GFR calc Af Amer: 28 mL/min — ABNORMAL LOW (ref >90)
GFR calc non Af Amer: 24 mL/min — ABNORMAL LOW (ref >90)
Glucose, Bld: 96 mg/dL (ref 70–99)
Potassium: 3.4 mEq/L — ABNORMAL LOW (ref 3.7–5.3)
SODIUM: 146 meq/L (ref 137–147)

## 2014-03-03 LAB — AMMONIA: AMMONIA: 62 umol/L — AB (ref 11–60)

## 2014-03-03 LAB — CBC
HEMATOCRIT: 28.2 % — AB (ref 39.0–52.0)
HEMOGLOBIN: 9.6 g/dL — AB (ref 13.0–17.0)
MCH: 30.8 pg (ref 26.0–34.0)
MCHC: 34 g/dL (ref 30.0–36.0)
MCV: 90.4 fL (ref 78.0–100.0)
Platelets: 58 10*3/uL — ABNORMAL LOW (ref 150–400)
RBC: 3.12 MIL/uL — ABNORMAL LOW (ref 4.22–5.81)
RDW: 18 % — ABNORMAL HIGH (ref 11.5–15.5)
WBC: 5.2 10*3/uL (ref 4.0–10.5)

## 2014-03-03 LAB — PROTIME-INR
INR: 1.65 — AB (ref 0.00–1.49)
Prothrombin Time: 19.7 seconds — ABNORMAL HIGH (ref 11.6–15.2)

## 2014-03-03 MED ORDER — FAT EMULSION 20 % IV EMUL
250.0000 mL | INTRAVENOUS | Status: AC
  Administered 2014-03-03: 250 mL via INTRAVENOUS
  Filled 2014-03-03: qty 250

## 2014-03-03 MED ORDER — POTASSIUM CHLORIDE IN NACL 20-0.9 MEQ/L-% IV SOLN
INTRAVENOUS | Status: AC
  Administered 2014-03-03: 11:00:00 via INTRAVENOUS

## 2014-03-03 MED ORDER — POTASSIUM CHLORIDE IN NACL 20-0.9 MEQ/L-% IV SOLN: INTRAVENOUS | Status: DC

## 2014-03-03 MED ORDER — PHENYLEPHRINE HCL 10 MG/ML IJ SOLN
INTRAMUSCULAR | Status: AC
  Filled 2014-03-03: qty 1

## 2014-03-03 MED ORDER — TRACE MINERALS CR-CU-F-FE-I-MN-MO-SE-ZN IV SOLN
INTRAVENOUS | Status: AC
  Administered 2014-03-03: 18:00:00 via INTRAVENOUS
  Filled 2014-03-03: qty 2000

## 2014-03-03 NOTE — Progress Notes (Signed)
Nutrition Follow-up  INTERVENTION:  TPN per pharmacy   NUTRITION DIAGNOSIS: Inadequate oral intake related to inability to eat as evidenced by NPO status day- 6  Goal: Pt to meet >/= 90% of their estimated nutrition needs; not met   Monitor: Nutrition support, labs, I/O's and wt changes    58 y.o. male  Admitting Dx: Upper GI bleed  ASSESSMENT:  Hx of cirrhosis and moderate malnutrition. Presents with acute esophageal GI bleeding, (s/p banding 03/03/2014) acute blood loss anemia (received 4 units of blood this admission), hemorrhagic shock.   Patient is continues on ventilator support to protect airway. Pt still requiring pressor support. Continued positive fluid balance (may need paracenteses) per MD. Wt gain of 12 kg since admission.  Nutrition Support initiated (TPN) pending for today. Nutrition needs re-assessed. Please note Last BM- 02/26/14.  Labs: Albumin-2.8 BUN, Creat.- remain elevated  MV: 10.2  L/min Temp (24hrs), Avg:97.4 F (36.3 C), Min:97 F (36.1 C), Max:98.3 F (36.8 C)   Height: Ht Readings from Last 1 Encounters:  02/26/14 _0  (1.727 m)    Weight: Wt Readings from Last 1 Encounters:  03/03/14 187 lb 13.3 oz (85.2 kg)  03/22/2014-admit wt 66.6 kg   Ideal Body Weight: 154# (70 gr)   Wt Readings from Last 10 Encounters:  03/03/14 187 lb 13.3 oz (85.2 kg)  02/05/14 155 lb 3.2 oz (70.398 kg)  01/23/14 162 lb 6.4 oz (73.664 kg)  01/20/14 159 lb 12.8 oz (72.485 kg)  01/06/14 158 lb (71.668 kg)  01/01/14 150 lb (68.04 kg)  11/26/13 168 lb (76.204 kg)  10/24/13 152 lb (68.947 kg)  09/09/13 156 lb 12.8 oz (71.124 kg)  08/30/13 164 lb 14.5 oz (74.8 kg)    Usual Body Weight: 152-164#   BMI:  Body mass index is 28.57 kg/(m^2). normal range  Estimated Nutritional Needs: Kcal: 1777   Protein: 110 gr Fluid: 2.2 liters daily (normal needs)  Skin: intact  Diet Order: Diet NPO time  specified  EDUCATION NEEDS: -No education needs identified at this time   Intake/Output Summary (Last 24 hours) at 03/03/14 0840 Last data filed at 03/03/14 0600  Gross per 24 hour  Intake 3309.92 ml  Output   2300 ml  Net 1009.92 ml    Last BM: 11/  Labs:   Recent Labs Lab 03/01/14 0613 03/02/14 0836 03/03/14 0424  NA 143 145 146  K 4.5 4.0 3.4*  CL 112 113* 113*  CO2 18* 19 19  BUN 65* 69* 69*  CREATININE 2.85* 2.89* 2.75*  CALCIUM 7.7* 8.1* 7.9*  GLUCOSE 85 89 96    CBG (last 3)   Recent Labs  02/28/14 1102  GLUCAP 81    Scheduled Meds: . antiseptic oral rinse  7 mL Mouth Rinse QID  . chlorhexidine  15 mL Mouth Rinse BID  . folic acid  1 mg Oral Daily  . furosemide  160 mg Intravenous BID  . lactulose  20 g Oral BID  . levofloxacin (LEVAQUIN) IV  750 mg Intravenous Q48H  . pantoprazole (PROTONIX) IV  40 mg Intravenous BID  . sodium chloride  3 mL Intravenous Q12H    Continuous Infusions: . 0.9 % NaCl with KCl 20 mEq / L    . fentaNYL infusion INTRAVENOUS 50 mcg/hr (03/03/14 0600)  . norepinephrine (LEVOPHED) Adult infusion 2.987 mcg/min (03/02/14 0800)  . phenylephrine (NEO-SYNEPHRINE) Adult infusion 30 mcg/min (03/03/14 0600)    Past Medical History  Diagnosis Date  . Malnutrition of moderate degree   .  Altered mental status   . Unspecified essential hypertension   . Anxiety state, unspecified   . Transient ischemic attack (TIA), and cerebral infarction without residual deficits(V12.54)   . Body mass index between 19-24, adult   . Atrial fibrillation     episode at [place].  Marland Kitchen Heart murmur   . Obstructive sleep apnea (adult) (pediatric)     to start back after surgery- not used for 2-52month  . GERD (gastroesophageal reflux disease)   . Unspecified epilepsy without mention of intractable epilepsy 13    only once  . Hand laceration 80    end of all fingers on rt hand  . Old myocardial infarction   . Asthma     "outgrew it"  .  Borderline diabetes   . 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 Acute alcoholic hepatitis     possible hepatic encephalopathy May 10, 2011  . Daily headache   . Migraine     "daily" (08/30/2013)  . Stroke ~ 2000    "mild"  . Arthritis     "I'm ate up w/it" (08/30/2013)  . Chronic lower back pain   . On home oxygen therapy     "2L prn" (08/30/2013)  . Chronic kidney disease     "one isn't functioning like it should" (08/30/2013)  . Cirrhosis   . Esophageal varices   . Thrombus     chronic,junction of splenic portal vein  . Pulmonary nodules     chronic an new 2015  . Cataract     Past Surgical History  Procedure Laterality Date  . Clavicle surgery Left ~ 2008    "crushed it"  . Hand surgery Left     lacerations of thumb. reattached  . Nasal septum surgery  08/30/2013  . Rhinoplasty  08/30/2013  . Hand surgery Right 1980's    "all 4 fingers cut off and reattached"  . Foot fracture surgery Right   . Septoplasty N/A 08/30/2013    Procedure: SEPTOPLASTY;  Surgeon: JIzora Gala MD;  [facility]  Service: ENT;  Laterality: N/A;  . Rhinoplasty N/A 08/30/2013    Procedure: RHINOPLASTY;  Surgeon: JIzora Gala MD;  [facility];  Service: ENT;  Laterality: N/A;  . Esophagogastroduodenoscopy  Dec 2014    Dr. PEarley Brooke Grade 2-3 esophageal varices s/p banding X 5. Erosive gastritis. Negative H.pylori  . Paracentesis      multiple  . Esophagogastroduodenoscopy N/A 03/14/2014    Procedure: ESOPHAGOGASTRODUODENOSCOPY (EGD);  Surgeon: SDanie Binder MD;  [facility];  Service: Endoscopy;  Laterality: N/A;    LColman CaterMS,RD,CSG,LDN Office: #[phone]: #[phone]

## 2014-03-03 NOTE — Progress Notes (Signed)
[facility] PROGRESS NOTE  Darren Abbott ZOX:[id number] DOB: Jul 07, 1955 DOA: Mar 24, 2014 PCP: Augustine Radar, MD  Summary:  This is a 58 year old gentleman with known history of alcoholic/hepatitis C cirrhosis and varices presents to the hospital with GI bleeding and abdominal pain. Shortly after admission, he required intubation for airway protection and became hypotensive requiring vasopressors. He was transfused PRBCs and had urgent endoscopy done which showed GI bleed due to esophageal varix.he is currently on a octreotide infusion and is continued on proton pump inhibitors. It is felt that if he rebleeds, he will likely require TIPS procedure. He currently remains on ventilator.  Assessment/Plan: 1. Acute esophageal variceal GI bleeding. Status post endoscopy with results noted as below. He was transfused 4 units of PRBCs and 2 units of FFP. Hemoglobin appears to be stable. He has completed course of octreotide infusion, continue proton pump inhibitors. Bleeding was further complicated by anticoagulation which is currently on hold. Continue to monitor hemoglobin. Since patient was having significant hematemesis on admission, he did require intubation for airway protection. Appreciate pulmonology assistance with vent management. He needs to be hemodynamically stable prior to extubation. 2. Acute blood loss anemia. Transfused 4 units PRBCs and 2 units of FFP. Continue to follow hemoglobin. Hemoglobin has trended down, but appears to have stabilized with no reported evidence of bleeding. May be dilutional. Continue to follow. 3. Hemorrhagic shock. Related to #1 and #2. Vasopressor changed from levophed to neo-synephrine. Attempts are being made to wean this off. Echo shows normal EF, blood cultures pending. 4. History of alcoholic/hepatitis C cirrhosis. He will complete a course of IV levaquin which is to complete 11/11. He is developing ascites with on going hydration and low albumin state.  Paracentesis has been ordered for today. On albumin infusions and lasix. Monitor urine output.  5. History of portal vein thrombosis. Not a candidate for anticoagulation. 6. History of paroxysmal atrial fibrillation. Not a candidate for anticoagulation 7. 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 Patient received vit K and INR has improved since admission, although still elevated. 8. Acute renal failure. Appreciate nephrology assistance. Felt to be ATN related to hypotension. Creatinine appears stable, but has not had much improvement.  His urine output is improving. He is on intravenous fluids, albumin and lasix. Continue to follow urine output. 9. Nutrition. Started on TPN  Code Status: full code Family Communication: discussed with daughter over the phone Disposition Plan: pending further hospital course   Consultants:  GI  Pulmonology  nephrology  Procedures: EGD: ESOPHAGUS: There were 5 columns of large varices in the lower third of the esophagus. There WAS ONE VARIX WITH evidence of a white nipple sign. 4 BANDING SITES IN DISTAL ESOPHAGUS. COMPLICATIONS: There were no immediate complications. PT VOMITED  BLOOD DURING PROCEDURE.  Echo:- Normal LV wall thickness with LVEF 60-65%, normal diastolic function. Mild left atrial enlargement. Mildly thickened mitral valve with bowing but no frank prolapse, trivial mtiral regugitation. Sclerotic aortic valve, noderately calcified noncoronary cusp. Unable to assess PASP.  Antibiotics:  levaquin 03/24/2014>>03/05/14  HPI/Subjective: Patient is intubated and sedated. No reported episodes of bleeding.   Objective: Filed Vitals:   03/03/14 0900  BP: 93/57  Pulse: 77  Temp:   Resp: 16    Intake/Output Summary (Last 24 hours) at 03/03/14 0921 Last data filed at 03/03/14 0600  Gross per 24 hour  Intake 3309.92 ml  Output   2300 ml  Net 1009.92 ml   [employer]  03/01/14 0500 03/02/14 0500 03/03/14 0500  Weight: 86 kg (189 lb 9.5 oz) 84.2 kg (185 lb 10 oz) 85.2 kg (187 lb 13.3 oz)    Exam:   General:  Sedated, on vent  Cardiovascular: s1, s2 rrr  Respiratory: cta b  Abdomen: soft, nt, distended, bs+  Musculoskeletal: 1-2+ edema b/l   Data Reviewed: Basic Metabolic Panel:  Recent Labs Lab 02/27/14 0159 02/28/14 0159 03/01/14 0613 03/02/14 0836 03/03/14 0424  NA 137 138 143 145 146  K 4.3 4.4 4.5 4.0 3.4*  CL 107 109 112 113* 113*  CO2 22 19 18* 19 19  GLUCOSE 90 88 85 89 96  BUN 59* 62* 65* 69* 69*  CREATININE 2.60* 2.56* 2.85* 2.89* 2.75*  CALCIUM 7.2* 7.3* 7.7* 8.1* 7.9*   Liver Function Tests:  Recent Labs Lab 03/24/2014 1040 02/26/14 0828 02/27/14 0159 03/02/14 0836  AST 206* 186* 231* 51*  ALT 71* 72* 98* 41  ALKPHOS 82 80 75 56  BILITOT 4.3* 4.5* 4.5* 7.8*  PROT 4.9* 4.5* 4.8* 5.0*  ALBUMIN 1.9* 1.9* 2.0* 2.8*    Recent Labs Lab 03/14/2014 1040  LIPASE 44    Recent Labs Lab 02/28/2014 1040 03/01/14 0957 03/02/14 0420 03/03/14 0424  AMMONIA 45 68* 66* 62*   CBC:  Recent Labs Lab 03/21/2014 1040  02/27/14 0159  02/28/14 0159 02/28/14 0805 03/01/14 0613 03/01/14 1350 03/02/14 0420 03/03/14 0424  WBC 12.1*  --  9.6  --  8.7  --  6.7  --  5.6 5.2  NEUTROABS 9.8*  --   --   --   --   --  4.3  --   --   --   HGB 7.0*  < > 10.4*  < > 9.9* 10.3* 8.9* 8.8* 8.7* 9.6*  HCT 21.3*  < > 30.3*  < > 29.4* 30.0* 26.8* 26.0* 26.3* 28.2*  MCV 94.7  --  87.1  --  89.1  --  90.2  --  90.4 90.4  PLT 94*  --  PLATELET CLUMPS NOTED ON SMEAR, COUNT APPEARS DECREASED  --  68*  --  49*  --  49* 58*  < > = values in this interval not displayed. Cardiac Enzymes: No results for input(s): CKTOTAL, CKMB, CKMBINDEX, TROPONINI in the last 168 hours. BNP (last 3 results) No results for input(s): PROBNP in the last 8760 hours. CBG:  Recent Labs Lab 02/28/14 1102  GLUCAP 81    Recent Results (from the past 240 hour(s))   MRSA PCR Screening     Status: None   Collection Time: 03/04/2014  2:15 PM  Result Value Ref Range Status   MRSA by PCR NEGATIVE NEGATIVE Final    Comment:        The GeneXpert MRSA Assay (FDA approved for NASAL specimens only), is one component of a comprehensive MRSA colonization surveillance program. It is not intended to diagnose MRSA infection nor to guide or monitor treatment for MRSA infections.      Studies: Dg Chest Port 1 View  03/02/2014   CLINICAL DATA:  Respiratory failure  EXAM: PORTABLE CHEST - 1 VIEW  COMPARISON:  03/01/2014  FINDINGS: Endotracheal tube is appropriately positioned. Left IJ central venous catheter is noted with tip over the brachiocephalic/SVC confluence. Left clavicular side plate and screws reidentified. Lungs are hypoaerated with crowding of the bronchovascular markings. Lung volumes are lower than on the prior exam with increased bibasilar atelectasis. Heart size is normal.  IMPRESSION: Decreased aeration with bilateral  lower lobe atelectasis.   Electronically Signed   By: Christiana PellantGretchen  Green M.D.   On: 03/02/2014 09:37   Dg Chest Port 1 View  03/01/2014   CLINICAL DATA:  Respiratory failure.  EXAM: PORTABLE CHEST - 1 VIEW  COMPARISON:  02/28/2014  FINDINGS: There is an endotracheal tube with tip between the clavicular heads and carina. Left IJ catheter, tip at the distal left brachiocephalic vein.  Normal heart size and mediastinal contours.  There is diffuse interstitial coarsening, accentuated by low lung volumes. Atelectasis in the right lower lobe has mildly improved. No effusion or pneumothorax. There is diffuse interstitial coarsening which is likely venous congestion.  IMPRESSION: 1. Stable positioning of endotracheal tube and central line. 2. Mildly improved right basilar atelectasis.   Electronically Signed   By: Tiburcio PeaJonathan  Watts M.D.   On: 03/01/2014 10:16    Scheduled Meds: . antiseptic oral rinse  7 mL Mouth Rinse QID  . chlorhexidine  15 mL  Mouth Rinse BID  . folic acid  1 mg Oral Daily  . furosemide  160 mg Intravenous BID  . lactulose  20 g Oral BID  . levofloxacin (LEVAQUIN) IV  750 mg Intravenous Q48H  . pantoprazole (PROTONIX) IV  40 mg Intravenous BID  . sodium chloride  3 mL Intravenous Q12H   Continuous Infusions: . 0.9 % NaCl with KCl 20 mEq / L    . fentaNYL infusion INTRAVENOUS 50 mcg/hr (03/03/14 0600)  . norepinephrine (LEVOPHED) Adult infusion 2.987 mcg/min (03/02/14 0800)  . phenylephrine (NEO-SYNEPHRINE) Adult infusion 30 mcg/min (03/03/14 0600)    Principal Problem:   Upper GI bleed Active Problems:   Abdominal pain   Hepatitis C   Esophageal varices   Cirrhosis   GIB (gastrointestinal bleeding)   Hemorrhagic shock   Acute renal failure   ATN (acute tubular necrosis)   Acute respiratory failure    Time spent: critical care time: 30 mins    MEMON,JEHANZEB  [facility] Pager [phone]. If 7PM-7AM, please contact night-coverage at [email or web address], password [email or web address] 03/03/2014, 9:21 AM  LOS: 6 days

## 2014-03-03 NOTE — Progress Notes (Addendum)
    Subjective: Remains on vent. Eyes open. No BM for 5 days documented. No overt signs of GI bleeding.   Objective: Vital signs in last 24 hours: Temp:  [97 F (36.1 C)-98.3 F (36.8 C)] 97.7 F (36.5 C) (11/09 0748) Pulse Rate:  [46-96] 53 (11/09 0700) Resp:  [13-26] 14 (11/09 0700) BP: (78-105)/(43-63) 103/60 mmHg (11/09 0726) SpO2:  [99 %-100 %] 99 % (11/09 0700) FiO2 (%):  [25 %-35 %] 25 % (11/09 0726) Weight:  [187 lb 13.3 oz (85.2 kg)] 187 lb 13.3 oz (85.2 kg) (11/09 0500) Last BM Date: 02/26/14 General:   Opens eyes to verbal stimuli.  Head:  Normocephalic and atraumatic. Eyes:  +scleral icterus Abdomen:  Bowel sounds present, tense ascites, small umbilical hernia present; hyperresonant with percussion Extremities:  Generalized anasarca, 2-3+ pitting edema upper and lower extremities Neurologic:  Opens eyes to verbal stimuli   Intake/Output from previous day: 11/08 0701 - 11/09 0700 In: 3309.9 [I.V.:3243.9; IV Piggyback:66] Out: 2300 [Urine:2300] Intake/Output this shift:    Lab Results:  Recent Labs  03/01/14 0613 03/01/14 1350 03/02/14 0420 03/03/14 0424  WBC 6.7  --  5.6 5.2  HGB 8.9* 8.8* 8.7* 9.6*  HCT 26.8* 26.0* 26.3* 28.2*  PLT 49*  --  49* 58*   BMET  Recent Labs  03/01/14 0613 03/02/14 0836 03/03/14 0424  NA 143 145 146  K 4.5 4.0 3.4*  CL 112 113* 113*  CO2 18* 19 19  GLUCOSE 85 89 96  BUN 65* 69* 69*  CREATININE 2.85* 2.89* 2.75*  CALCIUM 7.7* 8.1* 7.9*   LFT  Recent Labs  03/02/14 0836  PROT 5.0*  ALBUMIN 2.8*  AST 51*  ALT 41  ALKPHOS 56  BILITOT 7.8*   PT/INR  Recent Labs  03/03/14 0424  LABPROT 19.7*  INR 1.65*     Assessment: 58 year old male with history of ETOH/HCV cirrhosis, portal vein thrombosis on Xarelto as outpatient, presenting this admission with hematemesis and melena, acute blood loss anemia secondary to large variceal bleed. Procedure at bedside on 11/3 with 5 columns of large varices, banding X  4. 1 varix with evidence of white nipple sign. Continues to require intubation and pressor support. Remains without any evidence of overt GI bleeding, Hgb stable/improving. Total of 4 units PRBCs this admission.   Acute renal failure: multifactorial. Followed by Nephrology  Tense ascites: order bedside paracentesis. Fluid analysis for completeness' sake.   Hold on lactulose enemas for now and monitor clinically. TPN ordered today for nutrition.   Patient's condition remains guarded with overall poor prognosis.   Plan: Continue PPI IV BID TPN per pharmacy consult Paracentesis at bedside today with fluid analysis Recheck HFP, INR in am Nephrology and Pulmonology on board Continue Levaquin with last dose on 11/11 Hold on lactulose enemas for now unless clinically indicated   Nira RetortAnna W. Sams, ANP-BC [facility]    LOS: 6 days    03/03/2014, 8:38 AM  Attending note:  Paracentesis underway when I rounded this afternoon. Discussed with Dr. Tyron RussellBoles.

## 2014-03-03 NOTE — Progress Notes (Signed)
Subjective: He remains intubated and on mechanical ventilation. He remains on pressors now on Neo-Synephrine  Objective: Vital signs in last 24 hours: Temp:  [97 F (36.1 C)-98.3 F (36.8 C)] 97.7 F (36.5 C) (11/09 0748) Pulse Rate:  [46-96] 53 (11/09 0700) Resp:  [13-26] 14 (11/09 0700) BP: (78-105)/(43-63) 103/60 mmHg (11/09 0726) SpO2:  [96 %-100 %] 99 % (11/09 0700) FiO2 (%):  [25 %-35 %] 25 % (11/09 0726) Weight:  [85.2 kg (187 lb 13.3 oz)] 85.2 kg (187 lb 13.3 oz) (11/09 0500) Weight change: 1 kg (2 lb 3.3 oz) Last BM Date: 02/26/14  Intake/Output from previous day: 11/08 0701 - 11/09 0700 In: 3309.9 [I.V.:3243.9; IV Piggyback:66] Out: 2300 [Urine:2300]  PHYSICAL EXAM General appearance: intubated sedated on mechanical ventilation Resp: rhonchi bilaterally Cardio: regular rate and rhythm, S1, S2 normal, no murmur, click, rub or gallop GI: his abdomen is more protuberant than yesterday Extremities: extremities normal, atraumatic, no cyanosis or edema  Lab Results:  Results for orders placed or performed during the hospital encounter of 03/23/2014 (from the past 48 hour(s))  Ammonia     Status: Abnormal   Collection Time: 03/01/14  9:57 AM  Result Value Ref Range   Ammonia 68 (H) 11 - 60 umol/L  Hemoglobin and hematocrit, blood     Status: Abnormal   Collection Time: 03/01/14  1:50 PM  Result Value Ref Range   Hemoglobin 8.8 (L) 13.0 - 17.0 g/dL   HCT 26.0 (L) 39.0 - 52.0 %  CBC     Status: Abnormal   Collection Time: 03/02/14  4:20 AM  Result Value Ref Range   WBC 5.6 4.0 - 10.5 K/uL   RBC 2.91 (L) 4.22 - 5.81 MIL/uL   Hemoglobin 8.7 (L) 13.0 - 17.0 g/dL   HCT 26.3 (L) 39.0 - 52.0 %   MCV 90.4 78.0 - 100.0 fL   MCH 29.9 26.0 - 34.0 pg   MCHC 33.1 30.0 - 36.0 g/dL   RDW 17.7 (H) 11.5 - 15.5 %   Platelets 49 (L) 150 - 400 K/uL    Comment: PLATELET COUNT CONFIRMED BY SMEAR SPECIMEN CHECKED FOR CLOTS REPEATED TO VERIFY   Ammonia     Status: Abnormal   Collection Time: 03/02/14  4:20 AM  Result Value Ref Range   Ammonia 66 (H) 11 - 60 umol/L  Blood gas, arterial     Status: Abnormal   Collection Time: 03/02/14  4:37 AM  Result Value Ref Range   FIO2 0.35 %   Delivery systems VENTILATOR    Mode PRESSURE REGULATED VOLUME CONTROL    VT 550 mL   Rate 14 resp/min   Peep/cpap 5.0 cm H20   pH, Arterial 7.333 (L) 7.350 - 7.450   pCO2 arterial 32.2 (L) 35.0 - 45.0 mmHg   pO2, Arterial 114.0 (H) 80.0 - 100.0 mmHg   Bicarbonate 16.6 (L) 20.0 - 24.0 mEq/L   TCO2 15.6 0 - 100 mmol/L   Acid-base deficit 8.1 (H) 0.0 - 2.0 mmol/L   O2 Saturation 98.1 %   Patient temperature 37.0    Collection site RIGHT RADIAL    Drawn by [phone]    Sample type ARTERIAL DRAW    Allens test (pass/fail) PASS PASS  Comprehensive metabolic panel     Status: Abnormal   Collection Time: 03/02/14  8:36 AM  Result Value Ref Range   Sodium 145 137 - 147 mEq/L   Potassium 4.0 3.7 - 5.3 mEq/L   Chloride 113 (H)  96 - 112 mEq/L   CO2 19 19 - 32 mEq/L   Glucose, Bld 89 70 - 99 mg/dL   BUN 69 (H) 6 - 23 mg/dL   Creatinine, Ser 2.89 (H) 0.50 - 1.35 mg/dL   Calcium 8.1 (L) 8.4 - 10.5 mg/dL   Total Protein 5.0 (L) 6.0 - 8.3 g/dL   Albumin 2.8 (L) 3.5 - 5.2 g/dL   AST 51 (H) 0 - 37 U/L   ALT 41 0 - 53 U/L   Alkaline Phosphatase 56 39 - 117 U/L   Total Bilirubin 7.8 (H) 0.3 - 1.2 mg/dL   GFR calc non Af Amer 22 (L) >90 mL/min   GFR calc Af Amer 26 (L) >90 mL/min    Comment: (NOTE) The eGFR has been calculated using the CKD EPI equation. This calculation has not been validated in all clinical situations. eGFR's persistently <90 mL/min signify possible Chronic Kidney Disease.    Anion gap 13 5 - 15  Lactic acid, plasma     Status: None   Collection Time: 03/02/14  8:36 AM  Result Value Ref Range   Lactic Acid, Venous 1.5 0.5 - 2.2 mmol/L  CBC     Status: Abnormal   Collection Time: 03/03/14  4:24 AM  Result Value Ref Range   WBC 5.2 4.0 - 10.5 K/uL   RBC 3.12 (L)  4.22 - 5.81 MIL/uL   Hemoglobin 9.6 (L) 13.0 - 17.0 g/dL   HCT 28.2 (L) 39.0 - 52.0 %   MCV 90.4 78.0 - 100.0 fL   MCH 30.8 26.0 - 34.0 pg   MCHC 34.0 30.0 - 36.0 g/dL   RDW 18.0 (H) 11.5 - 15.5 %   Platelets 58 (L) 150 - 400 K/uL    Comment: SPECIMEN CHECKED FOR CLOTS CONSISTENT WITH PREVIOUS RESULT   Protime-INR     Status: Abnormal   Collection Time: 03/03/14  4:24 AM  Result Value Ref Range   Prothrombin Time 19.7 (H) 11.6 - 15.2 seconds   INR 1.65 (H) 0.00 - 1.49  Blood gas, arterial     Status: Abnormal   Collection Time: 03/03/14  4:38 AM  Result Value Ref Range   FIO2 0.30 %   Delivery systems VENTILATOR    Mode PRESSURE REGULATED VOLUME CONTROL    VT 550 mL   Rate 14 resp/min   Peep/cpap 5.0 cm H20   pH, Arterial 7.399 7.350 - 7.450   pCO2 arterial 29.5 (L) 35.0 - 45.0 mmHg   pO2, Arterial 101.0 (H) 80.0 - 100.0 mmHg   Bicarbonate 17.9 (L) 20.0 - 24.0 mEq/L   TCO2 16.7 0 - 100 mmol/L   Acid-base deficit 6.1 (H) 0.0 - 2.0 mmol/L   O2 Saturation 97.8 %   Patient temperature 37.0    Collection site RIGHT RADIAL    Drawn by [phone]    Sample type ARTERIAL DRAW    Allens test (pass/fail) PASS PASS    ABGS  Recent Labs  03/03/14 0438  PHART 7.399  PO2ART 101.0*  TCO2 16.7  HCO3 17.9*   CULTURES Recent Results (from the past 240 hour(s))  MRSA PCR Screening     Status: None   Collection Time: 03/11/2014  2:15 PM  Result Value Ref Range Status   MRSA by PCR NEGATIVE NEGATIVE Final    Comment:        The GeneXpert MRSA Assay (FDA approved for NASAL specimens only), is one component of a comprehensive MRSA colonization surveillance program. It  is not intended to diagnose MRSA infection nor to guide or monitor treatment for MRSA infections.    Studies/Results: Dg Chest Port 1 View  03/02/2014   CLINICAL DATA:  Respiratory failure  EXAM: PORTABLE CHEST - 1 VIEW  COMPARISON:  03/01/2014  FINDINGS: Endotracheal tube is appropriately positioned. Left IJ  central venous catheter is noted with tip over the brachiocephalic/SVC confluence. Left clavicular side plate and screws reidentified. Lungs are hypoaerated with crowding of the bronchovascular markings. Lung volumes are lower than on the prior exam with increased bibasilar atelectasis. Heart size is normal.  IMPRESSION: Decreased aeration with bilateral lower lobe atelectasis.   Electronically Signed   By: Conchita Paris M.D.   On: 03/02/2014 09:37   Dg Chest Port 1 View  03/01/2014   CLINICAL DATA:  Respiratory failure.  EXAM: PORTABLE CHEST - 1 VIEW  COMPARISON:  02/28/2014  FINDINGS: There is an endotracheal tube with tip between the clavicular heads and carina. Left IJ catheter, tip at the distal left brachiocephalic vein.  Normal heart size and mediastinal contours.  There is diffuse interstitial coarsening, accentuated by low lung volumes. Atelectasis in the right lower lobe has mildly improved. No effusion or pneumothorax. There is diffuse interstitial coarsening which is likely venous congestion.  IMPRESSION: 1. Stable positioning of endotracheal tube and central line. 2. Mildly improved right basilar atelectasis.   Electronically Signed   By: Jorje Guild M.D.   On: 03/01/2014 10:16    Medications:  Prior to Admission:  Prescriptions prior to admission  Medication Sig Dispense Refill Last Dose  . folic acid (FOLVITE) 1 MG tablet Take 1 tablet (1 mg total) by mouth daily. 30 tablet 1 Past Week at Unknown time  . furosemide (LASIX) 20 MG tablet Take 1 tablet (20 mg total) by mouth daily. 30 tablet 3 Past Week at Unknown time  . Lactulose 20 GM/30ML SOLN Take 30 mLs (20 g total) by mouth 2 (two) times daily. 240 mL 3 Past Week at Unknown time  . metoprolol (LOPRESSOR) 50 MG tablet Take 0.5 tablets (25 mg total) by mouth daily. 60 tablet 11 Past Week at Unknown time  . Multiple Vitamins-Minerals (CENTRUM SILVER PO) Take 1 tablet by mouth daily.   Past Week at Unknown time  . ondansetron  (ZOFRAN) 4 MG tablet Take 4 mg by mouth every 8 (eight) hours as needed for nausea or vomiting.   Past Week at Unknown time  . oxazepam (SERAX) 10 MG capsule Take 10 mg by mouth 2 (two) times daily.   Past Week at Unknown time  . oxyCODONE (OXY IR/ROXICODONE) 5 MG immediate release tablet Take 1 tablet (5 mg total) by mouth every 4 (four) hours as needed for severe pain. 100 tablet 0 Past Week at Unknown time  . pantoprazole (PROTONIX) 40 MG tablet Take 1 tablet (40 mg total) by mouth 2 (two) times daily before a meal. 30 tablet 1 Past Week at Unknown time  . Rivaroxaban (XARELTO) 15 MG TABS tablet Take 1 tablet (15 mg total) by mouth daily with breakfast. 30 tablet 1 Past Week at Unknown time  . spironolactone (ALDACTONE) 50 MG tablet Take 1 tablet (50 mg total) by mouth daily. 30 tablet 3 Past Week at Unknown time   Scheduled: . antiseptic oral rinse  7 mL Mouth Rinse QID  . chlorhexidine  15 mL Mouth Rinse BID  . folic acid  1 mg Oral Daily  . furosemide  160 mg Intravenous BID  . lactulose  20 g Oral BID  . levofloxacin (LEVAQUIN) IV  750 mg Intravenous Q48H  . pantoprazole (PROTONIX) IV  40 mg Intravenous BID  . sodium chloride  3 mL Intravenous Q12H   Continuous: . sodium chloride 100 mL/hr at 03/03/14 0600  . fentaNYL infusion INTRAVENOUS 50 mcg/hr (03/03/14 0600)  . norepinephrine (LEVOPHED) Adult infusion 2.987 mcg/min (03/02/14 0800)  . phenylephrine (NEO-SYNEPHRINE) Adult infusion 30 mcg/min (03/03/14 0600)   JZP:HXTAVWPV, fentaNYL, fentaNYL, LORazepam, morphine injection, sodium chloride  Assesment:he was admitted with upper GI bleed and hemorrhagic shock. He is better from that after treatment. He has multiple other medical problems including cirrhosis and Dr. Rosina Lowenstein note is seen and appreciated. He is still on pressor support and drops his blood pressure to the 70s when this is reduced  Principal Problem:   Upper GI bleed Active Problems:   Abdominal pain   Hepatitis C    Esophageal varices   Cirrhosis   GIB (gastrointestinal bleeding)   Hemorrhagic shock   Acute renal failure   ATN (acute tubular necrosis)    Plan:continue efforts to reduce his pressor support. Continue with other medications. His renal function is still about the same but he is making more urine    LOS: 6 days   Tirrell Buchberger L 03/03/2014, 8:12 AM

## 2014-03-03 NOTE — Procedures (Signed)
PreOperative Dx: Alcoholic cirrhosis, ascites Postoperative Dx: Alcoholic cirrhosis, ascites Procedure:   US guided paracentesis Radiologist:  Tyron RussellBoles Anesthesia:  10 ml of 1% lidocaine Specimen:  4000 ml of bright yellow ascitic fluid EBL:   < 1 ml Complications: None

## 2014-03-03 NOTE — Progress Notes (Signed)
PARENTERAL NUTRITION CONSULT NOTE - INITIAL  Pharmacy Consult for TPN Indication: malnutrition, hypoalbuminemia, intubated on ventilator, h/o GIB and not a candidate for tube feeds  No Known Allergies  Patient Measurements: Height: 5\' 8"  (172.7 cm) Weight: 187 lb 13.3 oz (85.2 kg) IBW/kg (Calculated) : 68.4 Adjusted Body Weight: 70Kg Usual Weight: 71Kg  Vital Signs: Temp: 98.1 F (36.7 C) (11/09 1140) Temp Source: Axillary (11/09 1140) BP: 81/50 mmHg (11/09 1045) Pulse Rate: 65 (11/09 1045) Intake/Output from previous day: 11/08 0701 - 11/09 0700 In: 3309.9 [I.V.:3243.9; IV Piggyback:66] Out: 2300 [Urine:2300] Intake/Output from this shift: Total I/O In: -  Out: 400 [Urine:400]  Labs:  Recent Labs  03/01/14 0613 03/01/14 1350 03/02/14 0420 03/03/14 0424  WBC 6.7  --  5.6 5.2  HGB 8.9* 8.8* 8.7* 9.6*  HCT 26.8* 26.0* 26.3* 28.2*  PLT 49*  --  49* 58*  INR  --   --   --  1.65*     Recent Labs  03/01/14 0613 03/02/14 0836 03/03/14 0424  NA 143 145 146  K 4.5 4.0 3.4*  CL 112 113* 113*  CO2 18* 19 19  GLUCOSE 85 89 96  BUN 65* 69* 69*  CREATININE 2.85* 2.89* 2.75*  CALCIUM 7.7* 8.1* 7.9*  PROT  --  5.0*  --   ALBUMIN  --  2.8*  --   AST  --  51*  --   ALT  --  41  --   ALKPHOS  --  56  --   BILITOT  --  7.8*  --    Estimated Creatinine Clearance: 31.1 mL/min (by C-G formula based on Cr of 2.75).   No results for input(s): GLUCAP in the last 72 hours.  Medical History: Past Medical History  Diagnosis Date  . Malnutrition of moderate degree   . Altered mental status   . Unspecified essential hypertension   . Anxiety state, unspecified   . Transient ischemic attack (TIA), and cerebral infarction without residual deficits(V12.54)   . Body mass index between 19-24, adult   . Atrial fibrillation     episode at [place].  [facility]. Heart murmur   . Obstructive sleep apnea (adult) (pediatric)     to start back after surgery- not used for 2-663months  .  GERD (gastroesophageal reflux disease)   . Unspecified epilepsy without mention of intractable epilepsy 13    only once  . Hand laceration 80    end of all fingers on rt hand  . Old myocardial infarction   . Asthma     "outgrew it"  . Borderline diabetes   . 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 Acute alcoholic hepatitis     possible hepatic encephalopathy May 10, 2011  . Daily headache   . Migraine     "daily" (08/30/2013)  . Stroke ~ 2000    "mild"  . Arthritis     "I'm ate up w/it" (08/30/2013)  . Chronic lower back pain   . On home oxygen therapy     "2L prn" (08/30/2013)  . Chronic kidney disease     "one isn't functioning like it should" (08/30/2013)  . Cirrhosis   . Esophageal varices   . Thrombus     chronic,junction of splenic portal vein  . Pulmonary nodules     chronic an new 2015  . Cataract    Medications:  Scheduled:  . antiseptic oral rinse  7 mL  Mouth Rinse QID  . chlorhexidine  15 mL Mouth Rinse BID  . folic acid  1 mg Oral Daily  . furosemide  160 mg Intravenous BID  . lactulose  20 g Oral BID  . levofloxacin (LEVAQUIN) IV  750 mg Intravenous Q48H  . pantoprazole (PROTONIX) IV  40 mg Intravenous BID  . sodium chloride  3 mL Intravenous Q12H   Insulin Requirements in the past 24 hours:  none  Current Nutrition:  Initiating TPN  Assessment: 58yo male with h/o cirrhosis and moderate malnutrition.  Pt admitted with acute esophageal bleeding which required banding on 02/26/2014.  Pt is intubated and on ventilator.  SCr elevated but renal fxn showing some signs of improvement, nephrology managing.  Plan for paracentesis today per report.    Estimated Nutritional Needs:  1777 kCal, 110 grams of protein per day Dietician note reviewed.   Plan:   Initiate Clinimix E 5/15 today at 640ml/hr  Provide MVI, trace elements, and Lipids daily with TPN  Adjust maintenance IVF rate by 1940ml/hr when TPN started  Monitor labs,  renal fxn, fluid status, and glucose tolerance  Titrate TPN to goal rate as tolerated  May need to remove Electrolytes from TPN if renal fxn does not improve further.  Valrie HartHall, Dilynn Munroe A 03/03/2014,12:20 PM

## 2014-03-03 NOTE — Progress Notes (Signed)
Subjective: Interval History: Patient remains intubated.  Objective: Vital signs in last 24 hours: Temp:  [97 F (36.1 C)-98.3 F (36.8 C)] 97.7 F (36.5 C) (11/09 0748) Pulse Rate:  [46-96] 53 (11/09 0700) Resp:  [13-26] 14 (11/09 0700) BP: (78-105)/(43-63) 103/60 mmHg (11/09 0726) SpO2:  [96 %-100 %] 99 % (11/09 0700) FiO2 (%):  [25 %-35 %] 25 % (11/09 0726) Weight:  [85.2 kg (187 lb 13.3 oz)] 85.2 kg (187 lb 13.3 oz) (11/09 0500) Weight change: 1 kg (2 lb 3.3 oz)  Intake/Output from previous day: 11/08 0701 - 11/09 0700 In: 3309.9 [I.V.:3243.9; IV Piggyback:66] Out: 2300 [Urine:2300] Intake/Output this shift:   Generally patient remains intubated. Lethargic.Does not respond to questions Chest decreased breath sound bilaterally. Heart exam revealed regular rate and rhythm, no murmur Abdomen: Distend and tight on examination,and positive bowl sound Extremities:  trace edema   Lab Results:  Recent Labs  03/02/14 0420 03/03/14 0424  WBC 5.6 5.2  HGB 8.7* 9.6*  HCT 26.3* 28.2*  PLT 49* 58*   BMET:   Recent Labs  03/02/14 0836 03/03/14 0424  NA 145 146  K 4.0 3.4*  CL 113* 113*  CO2 19 19  GLUCOSE 89 96  BUN 69* 69*  CREATININE 2.89* 2.75*  CALCIUM 8.1* 7.9*   No results for input(s): PTH in the last 72 hours. Iron Studies: No results for input(s): IRON, TIBC, TRANSFERRIN, FERRITIN in the last 72 hours.  Studies/Results: Dg Chest Port 1 View  03/02/2014   CLINICAL DATA:  Respiratory failure  EXAM: PORTABLE CHEST - 1 VIEW  COMPARISON:  03/01/2014  FINDINGS: Endotracheal tube is appropriately positioned. Left IJ central venous catheter is noted with tip over the brachiocephalic/SVC confluence. Left clavicular side plate and screws reidentified. Lungs are hypoaerated with crowding of the bronchovascular markings. Lung volumes are lower than on the prior exam with increased bibasilar atelectasis. Heart size is normal.  IMPRESSION: Decreased aeration with  bilateral lower lobe atelectasis.   Electronically Signed   By: Christiana PellantGretchen  Green M.D.   On: 03/02/2014 09:37   Dg Chest Port 1 View  03/01/2014   CLINICAL DATA:  Respiratory failure.  EXAM: PORTABLE CHEST - 1 VIEW  COMPARISON:  02/28/2014  FINDINGS: There is an endotracheal tube with tip between the clavicular heads and carina. Left IJ catheter, tip at the distal left brachiocephalic vein.  Normal heart size and mediastinal contours.  There is diffuse interstitial coarsening, accentuated by low lung volumes. Atelectasis in the right lower lobe has mildly improved. No effusion or pneumothorax. There is diffuse interstitial coarsening which is likely venous congestion.  IMPRESSION: 1. Stable positioning of endotracheal tube and central line. 2. Mildly improved right basilar atelectasis.   Electronically Signed   By: Tiburcio PeaJonathan  Watts M.D.   On: 03/01/2014 10:16    I have reviewed the patient's current medications.  Assessment/Plan: Problem #1 acute kidney injury: Possibly prerenal versus ATN. His BUN and creatinine is showing some sign of improvement .  He had about 2300 cc of out put  over the last 24 hours. Patient on lasix and improving Problem #2 hypotension: Most likely from hemorrhagic shock. His systolic blood pressure still low,. Presently he is on Levophed Problem #3 history of liver cirrhosis. Patient with ascites and  edema Problem #4 history of hepatitis C Problem #5 a trial fibrillation: His heart rate is controlled Problem #6 patient remains intubated and does not seem to tolerate weaning off. Problem #7 anemia: s/p blood transfussion his  hemoglobin is better Problem#8 Hypokalemia: Most likely from lasix and improved urine out put Plan:1] We'll d/c normal saline 2]We will start patient on normal saline with 20 meq of potassium at 100 cc/hr 3] we will check his comprehensive metabolic  panel in am    LOS: 6 days   Anne Sebring S 03/03/2014,8:26 AM

## 2014-03-04 LAB — PREALBUMIN: PREALBUMIN: 4.6 mg/dL — AB (ref 17.0–34.0)

## 2014-03-04 LAB — BLOOD GAS, ARTERIAL
Acid-base deficit: 3.8 mmol/L — ABNORMAL HIGH (ref 0.0–2.0)
Bicarbonate: 20.1 mEq/L (ref 20.0–24.0)
Drawn by: 317771
FIO2: 0.25 %
MECHVT: 550 mL
O2 Saturation: 95.4 %
PCO2 ART: 32.7 mmHg — AB (ref 35.0–45.0)
PEEP/CPAP: 5 cmH2O
PH ART: 7.405 (ref 7.350–7.450)
Patient temperature: 37
RATE: 14 resp/min
TCO2: 18.3 mmol/L (ref 0–100)
pO2, Arterial: 79.5 mmHg — ABNORMAL LOW (ref 80.0–100.0)

## 2014-03-04 LAB — PHOSPHORUS: Phosphorus: 3.6 mg/dL (ref 2.3–4.6)

## 2014-03-04 LAB — TRIGLYCERIDES: TRIGLYCERIDES: 87 mg/dL (ref <150)

## 2014-03-04 LAB — DIFFERENTIAL
BASOS ABS: 0 10*3/uL (ref 0.0–0.1)
Basophils Relative: 1 % (ref 0–1)
Eosinophils Absolute: 0.3 10*3/uL (ref 0.0–0.7)
Eosinophils Relative: 5 % (ref 0–5)
LYMPHS PCT: 11 % — AB (ref 12–46)
Lymphs Abs: 0.7 10*3/uL (ref 0.7–4.0)
Monocytes Absolute: 1.1 10*3/uL — ABNORMAL HIGH (ref 0.1–1.0)
Monocytes Relative: 17 % — ABNORMAL HIGH (ref 3–12)
NEUTROS ABS: 4.2 10*3/uL (ref 1.7–7.7)
NEUTROS PCT: 66 % (ref 43–77)

## 2014-03-04 LAB — COMPREHENSIVE METABOLIC PANEL
ALT: 33 U/L (ref 0–53)
AST: 49 U/L — AB (ref 0–37)
Albumin: 2.6 g/dL — ABNORMAL LOW (ref 3.5–5.2)
Alkaline Phosphatase: 64 U/L (ref 39–117)
Anion gap: 15 (ref 5–15)
BUN: 58 mg/dL — ABNORMAL HIGH (ref 6–23)
CALCIUM: 7.8 mg/dL — AB (ref 8.4–10.5)
CHLORIDE: 114 meq/L — AB (ref 96–112)
CO2: 22 mEq/L (ref 19–32)
Creatinine, Ser: 2.22 mg/dL — ABNORMAL HIGH (ref 0.50–1.35)
GFR calc Af Amer: 36 mL/min — ABNORMAL LOW (ref >90)
GFR, EST NON AFRICAN AMERICAN: 31 mL/min — AB (ref >90)
Glucose, Bld: 122 mg/dL — ABNORMAL HIGH (ref 70–99)
Potassium: 2.7 mEq/L — CL (ref 3.7–5.3)
SODIUM: 151 meq/L — AB (ref 137–147)
Total Bilirubin: 8.3 mg/dL — ABNORMAL HIGH (ref 0.3–1.2)
Total Protein: 5.4 g/dL — ABNORMAL LOW (ref 6.0–8.3)

## 2014-03-04 LAB — CBC
HCT: 31.1 % — ABNORMAL LOW (ref 39.0–52.0)
Hemoglobin: 10.8 g/dL — ABNORMAL LOW (ref 13.0–17.0)
MCH: 31 pg (ref 26.0–34.0)
MCHC: 34.7 g/dL (ref 30.0–36.0)
MCV: 89.4 fL (ref 78.0–100.0)
PLATELETS: 64 10*3/uL — AB (ref 150–400)
RBC: 3.48 MIL/uL — ABNORMAL LOW (ref 4.22–5.81)
RDW: 18.3 % — AB (ref 11.5–15.5)
WBC: 6.3 10*3/uL (ref 4.0–10.5)

## 2014-03-04 LAB — GLUCOSE, CAPILLARY
Glucose-Capillary: 113 mg/dL — ABNORMAL HIGH (ref 70–99)
Glucose-Capillary: 141 mg/dL — ABNORMAL HIGH (ref 70–99)

## 2014-03-04 LAB — MAGNESIUM: MAGNESIUM: 1.4 mg/dL — AB (ref 1.5–2.5)

## 2014-03-04 LAB — CORTISOL: CORTISOL PLASMA: 16.8 ug/dL

## 2014-03-04 MED ORDER — FENTANYL CITRATE 0.05 MG/ML IJ SOLN
INTRAMUSCULAR | Status: AC
  Filled 2014-03-04: qty 50

## 2014-03-04 MED ORDER — FAT EMULSION 20 % IV EMUL
250.0000 mL | INTRAVENOUS | Status: AC
  Administered 2014-03-04: 250 mL via INTRAVENOUS
  Filled 2014-03-04: qty 250

## 2014-03-04 MED ORDER — INSULIN ASPART 100 UNIT/ML ~~LOC~~ SOLN
0.0000 [IU] | Freq: Four times a day (QID) | SUBCUTANEOUS | Status: DC
Start: 2014-03-04 — End: 2014-03-06
  Administered 2014-03-04: 1 [IU] via SUBCUTANEOUS
  Administered 2014-03-05: 2 [IU] via SUBCUTANEOUS
  Administered 2014-03-05: 1 [IU] via SUBCUTANEOUS
  Administered 2014-03-05 – 2014-03-06 (×4): 2 [IU] via SUBCUTANEOUS
  Administered 2014-03-06: 1 [IU] via SUBCUTANEOUS

## 2014-03-04 MED ORDER — PHENYLEPHRINE HCL 10 MG/ML IJ SOLN
0.0000 ug/min | INTRAVENOUS | Status: DC
  Administered 2014-03-04: 50 ug/min via INTRAVENOUS
  Administered 2014-03-04: 40 ug/min via INTRAVENOUS
  Administered 2014-03-06: 45.333 ug/min via INTRAVENOUS
  Administered 2014-03-07: 40 ug/min via INTRAVENOUS
  Administered 2014-03-08: 30 ug/min via INTRAVENOUS
  Administered 2014-03-09: 20 ug/min via INTRAVENOUS
  Filled 2014-03-04 (×7): qty 4

## 2014-03-04 MED ORDER — KCL IN DEXTROSE-NACL 20-5-0.45 MEQ/L-%-% IV SOLN
INTRAVENOUS | Status: DC
  Administered 2014-03-04 – 2014-03-05 (×2): via INTRAVENOUS

## 2014-03-04 MED ORDER — PHENYLEPHRINE HCL 10 MG/ML IJ SOLN
INTRAMUSCULAR | Status: AC
  Filled 2014-03-04: qty 4

## 2014-03-04 MED ORDER — CLINIMIX E/DEXTROSE (5/15) 5 % IV SOLN
INTRAVENOUS | Status: AC
  Administered 2014-03-04: 18:00:00 via INTRAVENOUS
  Filled 2014-03-04: qty 2000

## 2014-03-04 MED ORDER — HYDROCORTISONE NA SUCCINATE PF 100 MG IJ SOLR
100.0000 mg | Freq: Three times a day (TID) | INTRAMUSCULAR | Status: DC
  Administered 2014-03-04 – 2014-03-07 (×10): 100 mg via INTRAVENOUS
  Filled 2014-03-04 (×10): qty 2

## 2014-03-04 MED ORDER — POTASSIUM CHLORIDE 10 MEQ/100ML IV SOLN
10.0000 meq | INTRAVENOUS | Status: AC
  Administered 2014-03-04 (×4): 10 meq via INTRAVENOUS
  Filled 2014-03-04 (×4): qty 100

## 2014-03-04 MED ORDER — MAGNESIUM SULFATE 2 GM/50ML IV SOLN
2.0000 g | Freq: Once | INTRAVENOUS | Status: AC
  Administered 2014-03-04: 2 g via INTRAVENOUS
  Filled 2014-03-04: qty 50

## 2014-03-04 MED ORDER — SODIUM CHLORIDE 0.9 % IV SOLN: INTRAVENOUS | Status: DC | PRN

## 2014-03-04 NOTE — Plan of Care (Signed)
Problem: Phase I Progression Outcomes Goal: Voiding-avoid urinary catheter unless indicated Outcome: Not Met (add Reason) Patient critical. Foley catheter indicated due to need for accurate I&O's

## 2014-03-04 NOTE — Progress Notes (Signed)
Darren Abbott  MRN: [id number]  DOB/AGE: 58/10/1955 58 y.o.  Primary Care Physician:ROBERSON, Darren ClockKRISTINA, MD  Admit date: 03/23/2014  Chief Complaint:  Chief Complaint  Patient presents with  . GI Bleeding    Abbott-Pt presented on  02/24/2014 with  Chief Complaint  Patient presents with  . GI Bleeding  .    Pt remains intubated  Meds . antiseptic oral rinse  7 mL Mouth Rinse QID  . chlorhexidine  15 mL Mouth Rinse BID  . folic acid  1 mg Oral Daily  . furosemide  160 mg Intravenous BID  . lactulose  20 g Oral BID  . levofloxacin (LEVAQUIN) IV  750 mg Intravenous Q48H  . magnesium sulfate 1 - 4 g bolus IVPB  2 g Intravenous Once  . pantoprazole (PROTONIX) IV  40 mg Intravenous BID  . potassium chloride  10 mEq Intravenous Q1 Hr x 4  . sodium chloride  3 mL Intravenous Q12H       Physical Exam: Vital signs in last 24 hours: Temp:  [97.1 F (36.2 C)-98.6 F (37 C)] 97.4 F (36.3 C) (11/10 0810) Pulse Rate:  [40-101] 57 (11/10 0845) Resp:  [13-26] 14 (11/10 0845) BP: (70-155)/(42-81) 104/60 mmHg (11/10 0845) SpO2:  [93 %-100 %] 100 % (11/10 0845) FiO2 (%):  [25 %] 25 % (11/10 0729) Weight:  [187 lb 9.8 oz (85.1 kg)] 187 lb 9.8 oz (85.1 kg) (11/10 0500) Weight change: -3.5 oz (-0.1 kg) Last BM Date: 02/26/14  Intake/Output from previous day: 11/09 0701 - 11/10 0700 In: 2484.1 [I.V.:2183.8; IV Piggyback:282; TPN:18.3] Out: 4925 [Urine:4925]     Physical Exam: General- pt is intubated,sedated Resp- ET tube in situ +B/L Rhonchi CVS- S1S2 regular in rate and rhythm GIT- BS+, soft, NT, ND EXT- 2+ LE Edema,NO Cyanosis   Lab Results: CBC  Recent Labs  03/03/14 0424 03/04/14 0530  WBC 5.2 6.3  HGB 9.6* 10.8*  HCT 28.2* 31.1*  PLT 58* 64*    BMET  Recent Labs  03/03/14 0424 03/04/14 0530  NA 146 151*  K 3.4* 2.7*  CL 113* 114*  CO2 19 22  GLUCOSE 96 122*  BUN 69* 58*  CREATININE 2.75* 2.22*  CALCIUM 7.9* 7.8*   Creat Trend 2015  0.59=>2.89=>2.75=>2.2    MICRO Recent Results (from the past 240 hour(Abbott))  MRSA PCR Screening     Status: None   Collection Time: 03/01/2014  2:15 PM  Result Value Ref Range Status   MRSA by PCR NEGATIVE NEGATIVE Final    Comment:        The GeneXpert MRSA Assay (FDA approved for NASAL specimens only), is one component of a comprehensive MRSA colonization surveillance program. It is not intended to diagnose MRSA infection nor to guide or monitor treatment for MRSA infections.   Culture, blood (routine x 2)     Status: None (Preliminary result)   Collection Time: 03/02/14  9:47 AM  Result Value Ref Range Status   Specimen Description PORTA CATH  Final   Special Requests BOTTLES DRAWN AEROBIC AND ANAEROBIC 6CC  Final   Culture NO GROWTH 1 DAY  Final   Report Status PENDING  Incomplete  Culture, blood (routine x 2)     Status: None (Preliminary result)   Collection Time: 03/02/14  9:53 AM  Result Value Ref Range Status   Specimen Description RIGHT ANTECUBITAL  Final   Special Requests BOTTLES DRAWN AEROBIC AND ANAEROBIC 10CC  Final   Culture NO GROWTH 1  DAY  Final   Report Status PENDING  Incomplete      Lab Results  Component Value Date   CALCIUM 7.8* 03/04/2014   PHOS 3.6 03/04/2014        Impression: 1)Renal AKI secondary to ATN   ATN sec to Hemorrhagic shock  Hypotension/anemia / Sprinololactone  AKi stable/slow improvement   No need of Hd yet  2)CVs hemodynamically unstable  Continues to be on vasopressors  3)Anemia HGb at goal (9--11) Abbott/p PRBC   4)CKD Mineral-Bone Disorder- Not indicated to check in AKI PTH  Secondary Hyperparathyroidism Phosphorus   5)GI- admitted with Variceal bleed GI and Primary MD following  6)Electrolytes  Hypokalemia Earlier was Hyperkalemic  Hypernatremic    Free water deficit  7)Acid base Co2 on lower side   8) Resp  Intubated.  Plan:  Will suggest to increase free water .      Darren Abbott 03/04/2014, 9:04 AM

## 2014-03-04 NOTE — Plan of Care (Signed)
Problem: Phase I Progression Outcomes Goal: Oral Care per Protocol Outcome: Progressing

## 2014-03-04 NOTE — Plan of Care (Signed)
Problem: Phase I Progression Outcomes Goal: Oral Care per Protocol Outcome: Progressing Goal: ARDS Protocol initiated if indicated Outcome: Not Applicable Date Met:  36/54/27

## 2014-03-04 NOTE — Procedures (Signed)
Arterial Catheter Insertion Procedure Note Darren Abbott [id number] 11/03/1955  Procedure: Insertion of Arterial Catheter  Indications: Blood pressure monitoring  Procedure Details Consent: Risks of procedure as well as the alternatives and risks of each were explained to the (patient/caregiver).  Consent for procedure obtained. Time Out: Verified patient identification, verified procedure, site/side was marked, verified correct patient position, special equipment/implants available, medications/allergies/relevent history reviewed, required imaging and test results available.  Performed  Maximum sterile technique was used including antiseptics, cap, gloves, gown, hand hygiene, mask and sheet. Skin prep: Chlorhexidine; local anesthetic administered 20 gauge catheter was inserted into right radial artery using the Seldinger technique.  Evaluation Blood flow good; BP tracing good. Complications: No apparent complications.   Renae FickleScott, Latise Dilley Michelle 03/04/2014

## 2014-03-04 NOTE — Progress Notes (Signed)
Subjective: He remains intubated and on the ventilator. He had 4 L of fluid removed yesterday. He has actually increased his requirement for pressor support.  Objective: Vital signs in last 24 hours: Temp:  [97.1 F (36.2 C)-98.6 F (37 C)] 97.4 F (36.3 C) (11/10 0810) Pulse Rate:  [40-101] 57 (11/10 0845) Resp:  [13-26] 14 (11/10 0845) BP: (70-155)/(42-81) 104/60 mmHg (11/10 0845) SpO2:  [93 %-100 %] 100 % (11/10 0845) FiO2 (%):  [25 %] 25 % (11/10 0729) Weight:  [85.1 kg (187 lb 9.8 oz)] 85.1 kg (187 lb 9.8 oz) (11/10 0500) Weight change: -0.1 kg (-3.5 oz) Last BM Date: 02/26/14  Intake/Output from previous day: 11/09 0701 - 11/10 0700 In: 2484.1 [I.V.:2183.8; IV Piggyback:282; TPN:18.3] Out: 4925 [[id number]]  PHYSICAL EXAM General appearance: he is intubated sedated and on the ventilator. He is jaundiced. When his wakeup assessment is done he is responsive Resp: rhonchi bilaterally Cardio: regular rate and rhythm, S1, S2 normal, no murmur, click, rub or gallop GI: less protuberant than yesterday Extremities: extremities normal, atraumatic, no cyanosis or edema  Lab Results:  Results for orders placed or performed during the hospital encounter of 03/14/2014 (from the past 48 hour(s))  Culture, blood (routine x 2)     Status: None (Preliminary result)   Collection Time: 03/02/14  9:47 AM  Result Value Ref Range   Specimen Description PORTA CATH    Special Requests BOTTLES DRAWN AEROBIC AND ANAEROBIC 6CC    Culture NO GROWTH 1 DAY    Report Status PENDING   Culture, blood (routine x 2)     Status: None (Preliminary result)   Collection Time: 03/02/14  9:53 AM  Result Value Ref Range   Specimen Description RIGHT ANTECUBITAL    Special Requests BOTTLES DRAWN AEROBIC AND ANAEROBIC 10CC    Culture NO GROWTH 1 DAY    Report Status PENDING   Basic metabolic panel     Status: Abnormal   Collection Time: 03/03/14  4:24 AM  Result Value Ref Range   Sodium 146 137 - 147 mEq/L    Potassium 3.4 (L) 3.7 - 5.3 mEq/L   Chloride 113 (H) 96 - 112 mEq/L   CO2 19 19 - 32 mEq/L   Glucose, Bld 96 70 - 99 mg/dL   BUN 69 (H) 6 - 23 mg/dL   Creatinine, Ser 2.75 (H) 0.50 - 1.35 mg/dL   Calcium 7.9 (L) 8.4 - 10.5 mg/dL   GFR calc non Af Amer 24 (L) >90 mL/min   GFR calc Af Amer 28 (L) >90 mL/min    Comment: (NOTE) The eGFR has been calculated using the CKD EPI equation. This calculation has not been validated in all clinical situations. eGFR's persistently <90 mL/min signify possible Chronic Kidney Disease.    Anion gap 14 5 - 15  CBC     Status: Abnormal   Collection Time: 03/03/14  4:24 AM  Result Value Ref Range   WBC 5.2 4.0 - 10.5 K/uL   RBC 3.12 (L) 4.22 - 5.81 MIL/uL   Hemoglobin 9.6 (L) 13.0 - 17.0 g/dL   HCT 28.2 (L) 39.0 - 52.0 %   MCV 90.4 78.0 - 100.0 fL   MCH 30.8 26.0 - 34.0 pg   MCHC 34.0 30.0 - 36.0 g/dL   RDW 18.0 (H) 11.5 - 15.5 %   Platelets 58 (L) 150 - 400 K/uL    Comment: SPECIMEN CHECKED FOR CLOTS CONSISTENT WITH PREVIOUS RESULT   Protime-INR  Status: Abnormal   Collection Time: 03/03/14  4:24 AM  Result Value Ref Range   Prothrombin Time 19.7 (H) 11.6 - 15.2 seconds   INR 1.65 (H) 0.00 - 1.49  Ammonia     Status: Abnormal   Collection Time: 03/03/14  4:24 AM  Result Value Ref Range   Ammonia 62 (H) 11 - 60 umol/L  Blood gas, arterial     Status: Abnormal   Collection Time: 03/03/14  4:38 AM  Result Value Ref Range   FIO2 0.30 %   Delivery systems VENTILATOR    Mode PRESSURE REGULATED VOLUME CONTROL    VT 550 mL   Rate 14 resp/min   Peep/cpap 5.0 cm H20   pH, Arterial 7.399 7.350 - 7.450   pCO2 arterial 29.5 (L) 35.0 - 45.0 mmHg   pO2, Arterial 101.0 (H) 80.0 - 100.0 mmHg   Bicarbonate 17.9 (L) 20.0 - 24.0 mEq/L   TCO2 16.7 0 - 100 mmol/L   Acid-base deficit 6.1 (H) 0.0 - 2.0 mmol/L   O2 Saturation 97.8 %   Patient temperature 37.0    Collection site RIGHT RADIAL    Drawn by [phone]    Sample type ARTERIAL DRAW    Allens  test (pass/fail) PASS PASS  Body fluid cell count with differential     Status: Abnormal   Collection Time: 03/03/14  3:30 PM  Result Value Ref Range   Fluid Type-FCT Body Fluid    Color, Fluid YELLOW YELLOW   Appearance, Fluid CLEAR (A) CLEAR   WBC, Fluid 108 0 - 1000 cu mm   Neutrophil Count, Fluid 48 (H) 0 - 25 %   Lymphs, Fluid 25 %   Monocyte-Macrophage-Serous Fluid 27 (L) 50 - 90 %   Eos, Fluid 0 %   Other Cells, Fluid FEW %    Comment: OTHER CELLS IDENTIFIED AS MESOTHELIAL CELLS FEW REACTIVE CELLS SEEN.   Blood gas, arterial     Status: Abnormal   Collection Time: 03/04/14  5:00 AM  Result Value Ref Range   FIO2 0.25 %   Delivery systems VENTILATOR    Mode PRESSURE REGULATED VOLUME CONTROL    VT 550 mL   Rate 14.0 resp/min   Peep/cpap 5.0 cm H20   pH, Arterial 7.405 7.350 - 7.450   pCO2 arterial 32.7 (L) 35.0 - 45.0 mmHg   pO2, Arterial 79.5 (L) 80.0 - 100.0 mmHg   Bicarbonate 20.1 20.0 - 24.0 mEq/L   TCO2 18.3 0 - 100 mmol/L   Acid-base deficit 3.8 (H) 0.0 - 2.0 mmol/L   O2 Saturation 95.4 %   Patient temperature 37.0    Collection site RIGHT RADIAL    Drawn by [id number]    Sample type ARTERIAL DRAW    Allens test (pass/fail) PASS PASS  Comprehensive metabolic panel     Status: Abnormal   Collection Time: 03/04/14  5:30 AM  Result Value Ref Range   Sodium 151 (H) 137 - 147 mEq/L   Potassium 2.7 (LL) 3.7 - 5.3 mEq/L    Comment: CRITICAL RESULT CALLED TO, READ BACK BY AND VERIFIED WITH: WAGONER,R AT 6:30AM ON 03/04/14 BY FESTERMAN,C    Chloride 114 (H) 96 - 112 mEq/L   CO2 22 19 - 32 mEq/L   Glucose, Bld 122 (H) 70 - 99 mg/dL   BUN 58 (H) 6 - 23 mg/dL   Creatinine, Ser 2.22 (H) 0.50 - 1.35 mg/dL   Calcium 7.8 (L) 8.4 - 10.5 mg/dL   Total Protein  5.4 (L) 6.0 - 8.3 g/dL   Albumin 2.6 (L) 3.5 - 5.2 g/dL   AST 49 (H) 0 - 37 U/L   ALT 33 0 - 53 U/L   Alkaline Phosphatase 64 39 - 117 U/L   Total Bilirubin 8.3 (H) 0.3 - 1.2 mg/dL   GFR calc non Af Amer 31 (L) >90  mL/min   GFR calc Af Amer 36 (L) >90 mL/min    Comment: (NOTE) The eGFR has been calculated using the CKD EPI equation. This calculation has not been validated in all clinical situations. eGFR's persistently <90 mL/min signify possible Chronic Kidney Disease.    Anion gap 15 5 - 15  Magnesium     Status: Abnormal   Collection Time: 03/04/14  5:30 AM  Result Value Ref Range   Magnesium 1.4 (L) 1.5 - 2.5 mg/dL  Phosphorus     Status: None   Collection Time: 03/04/14  5:30 AM  Result Value Ref Range   Phosphorus 3.6 2.3 - 4.6 mg/dL  Triglycerides     Status: None   Collection Time: 03/04/14  5:30 AM  Result Value Ref Range   Triglycerides 87 <150 mg/dL  CBC     Status: Abnormal   Collection Time: 03/04/14  5:30 AM  Result Value Ref Range   WBC 6.3 4.0 - 10.5 K/uL   RBC 3.48 (L) 4.22 - 5.81 MIL/uL   Hemoglobin 10.8 (L) 13.0 - 17.0 g/dL   HCT 31.1 (L) 39.0 - 52.0 %   MCV 89.4 78.0 - 100.0 fL   MCH 31.0 26.0 - 34.0 pg   MCHC 34.7 30.0 - 36.0 g/dL   RDW 18.3 (H) 11.5 - 15.5 %   Platelets 64 (L) 150 - 400 K/uL    Comment: SPECIMEN CHECKED FOR CLOTS CONSISTENT WITH PREVIOUS RESULT   Differential     Status: Abnormal   Collection Time: 03/04/14  5:30 AM  Result Value Ref Range   Neutrophils Relative % 66 43 - 77 %   Neutro Abs 4.2 1.7 - 7.7 K/uL   Lymphocytes Relative 11 (L) 12 - 46 %   Lymphs Abs 0.7 0.7 - 4.0 K/uL   Monocytes Relative 17 (H) 3 - 12 %   Monocytes Absolute 1.1 (H) 0.1 - 1.0 K/uL   Eosinophils Relative 5 0 - 5 %   Eosinophils Absolute 0.3 0.0 - 0.7 K/uL   Basophils Relative 1 0 - 1 %   Basophils Absolute 0.0 0.0 - 0.1 K/uL    ABGS  Recent Labs  03/04/14 0500  PHART 7.405  PO2ART 79.5*  TCO2 18.3  HCO3 20.1   CULTURES Recent Results (from the past 240 hour(s))  MRSA PCR Screening     Status: None   Collection Time: 03/01/2014  2:15 PM  Result Value Ref Range Status   MRSA by PCR NEGATIVE NEGATIVE Final    Comment:        The GeneXpert MRSA Assay  (FDA approved for NASAL specimens only), is one component of a comprehensive MRSA colonization surveillance program. It is not intended to diagnose MRSA infection nor to guide or monitor treatment for MRSA infections.   Culture, blood (routine x 2)     Status: None (Preliminary result)   Collection Time: 03/02/14  9:47 AM  Result Value Ref Range Status   Specimen Description PORTA CATH  Final   Special Requests BOTTLES DRAWN AEROBIC AND ANAEROBIC 6CC  Final   Culture NO GROWTH 1 DAY  Final  Report Status PENDING  Incomplete  Culture, blood (routine x 2)     Status: None (Preliminary result)   Collection Time: 03/02/14  9:53 AM  Result Value Ref Range Status   Specimen Description RIGHT ANTECUBITAL  Final   Special Requests BOTTLES DRAWN AEROBIC AND ANAEROBIC 10CC  Final   Culture NO GROWTH 1 DAY  Final   Report Status PENDING  Incomplete   Studies/Results: US Paracentesis  03/03/2014   CLINICAL DATA:  Cirrhosis, ascites ; in ICA on ventilator post variceal GI bleed post banding  EXAM: ULTRASOUND GUIDED DIAGNOSTIC AND THERAPEUTIC PARACENTESIS  COMPARISON:  02/21/2014  PROCEDURE: Procedure, benefits, and risks of procedure were discussed with patient.  Written informed consent for procedure was obtained.  Time out protocol followed.  Adequate collection of ascites localized by ultrasound in RIGHT lower quadrant.  Skin prepped and draped in usual sterile fashion.  Skin and soft tissues anesthetized with 10 mL of 1% lidocaine.  5 [place] Yueh catheter placed into peritoneal cavity.  4000 mL of bright yellow fluid aspirated by vacuum bottle suction.  Procedure tolerated well by patient without immediate complication.  Hemostasis obtained by manual compression.  Pressure dressing was placed at conclusion of procedure.  FINDINGS: A total of approximately 4000 mL of ascitic fluid was removed. A fluid sample of 180 mL was sent for laboratory analysis.  IMPRESSION: Successful ultrasound guided  paracentesis yielding 4000 mL of ascites.   Electronically Signed   By: Lavonia Dana M.D.   On: 03/03/2014 15:56   Dg Chest Port 1 View  03/02/2014   CLINICAL DATA:  Respiratory failure  EXAM: PORTABLE CHEST - 1 VIEW  COMPARISON:  03/01/2014  FINDINGS: Endotracheal tube is appropriately positioned. Left IJ central venous catheter is noted with tip over the brachiocephalic/SVC confluence. Left clavicular side plate and screws reidentified. Lungs are hypoaerated with crowding of the bronchovascular markings. Lung volumes are lower than on the prior exam with increased bibasilar atelectasis. Heart size is normal.  IMPRESSION: Decreased aeration with bilateral lower lobe atelectasis.   Electronically Signed   By: Conchita Paris M.D.   On: 03/02/2014 09:37    Medications:  Prior to Admission:  Prescriptions prior to admission  Medication Sig Dispense Refill Last Dose  . folic acid (FOLVITE) 1 MG tablet Take 1 tablet (1 mg total) by mouth daily. 30 tablet 1 Past Week at Unknown time  . furosemide (LASIX) 20 MG tablet Take 1 tablet (20 mg total) by mouth daily. 30 tablet 3 Past Week at Unknown time  . Lactulose 20 GM/30ML SOLN Take 30 mLs (20 g total) by mouth 2 (two) times daily. 240 mL 3 Past Week at Unknown time  . metoprolol (LOPRESSOR) 50 MG tablet Take 0.5 tablets (25 mg total) by mouth daily. 60 tablet 11 Past Week at Unknown time  . Multiple Vitamins-Minerals (CENTRUM SILVER PO) Take 1 tablet by mouth daily.   Past Week at Unknown time  . ondansetron (ZOFRAN) 4 MG tablet Take 4 mg by mouth every 8 (eight) hours as needed for nausea or vomiting.   Past Week at Unknown time  . oxazepam (SERAX) 10 MG capsule Take 10 mg by mouth 2 (two) times daily.   Past Week at Unknown time  . oxyCODONE (OXY IR/ROXICODONE) 5 MG immediate release tablet Take 1 tablet (5 mg total) by mouth every 4 (four) hours as needed for severe pain. 100 tablet 0 Past Week at Unknown time  . pantoprazole (PROTONIX) 40 MG tablet  Take 1 tablet (40 mg total) by mouth 2 (two) times daily before a meal. 30 tablet 1 Past Week at Unknown time  . Rivaroxaban (XARELTO) 15 MG TABS tablet Take 1 tablet (15 mg total) by mouth daily with breakfast. 30 tablet 1 Past Week at Unknown time  . spironolactone (ALDACTONE) 50 MG tablet Take 1 tablet (50 mg total) by mouth daily. 30 tablet 3 Past Week at Unknown time   Scheduled: . antiseptic oral rinse  7 mL Mouth Rinse QID  . chlorhexidine  15 mL Mouth Rinse BID  . folic acid  1 mg Oral Daily  . furosemide  160 mg Intravenous BID  . lactulose  20 g Oral BID  . levofloxacin (LEVAQUIN) IV  750 mg Intravenous Q48H  . magnesium sulfate 1 - 4 g bolus IVPB  2 g Intravenous Once  . pantoprazole (PROTONIX) IV  40 mg Intravenous BID  . potassium chloride  10 mEq Intravenous Q1 Hr x 4  . sodium chloride  3 mL Intravenous Q12H   Continuous: . Marland KitchenTPN (CLINIMIX-E) Adult 40 mL/hr at 03/03/14 1738   And  . fat emulsion 250 mL (03/03/14 1738)  . Marland KitchenTPN (CLINIMIX-E) Adult     And  . fat emulsion    . fentaNYL infusion INTRAVENOUS 100 mcg/hr (03/04/14 0751)  . phenylephrine (NEO-SYNEPHRINE) Adult infusion 60 mcg/min (03/04/14 0610)   KAJ:GOTLXBWI, fentaNYL, fentaNYL, LORazepam, morphine injection, sodium chloride  Assesment:he was admitted with upper GI bleeding. He developed hemorrhagic shock. He required emergency treatment of esophageal varices and has not had any evidence of further bleeding. 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 He had significant amount of ascites which was removed yesterday. His bilirubin is going up. His renal function is slightly better today. His need for pressors is increasing. When I saw him earlier I was optimistic that we were going to be able to get him off the ventilator and that he would survive this episode. However his bilirubin continues to go up his renal function is only slightly better and his need for pressors is  increasing.  Principal Problem:   Upper GI bleed Active Problems:   Abdominal pain   Hepatitis C   Esophageal varices   Cirrhosis   GIB (gastrointestinal bleeding)   Hemorrhagic shock   Acute renal failure   ATN (acute tubular necrosis)   Acute respiratory failure    Plan:continue current treatments    LOS: 7 days   Kandyce Dieguez L 03/04/2014, 8:49 AM

## 2014-03-04 NOTE — Progress Notes (Addendum)
Subjective: Eyes open, moving upper extremities. Unable to communicate, remains ventilated. No overt signs of GI bleeding. Paracentesis yesterday with 4 liters removed.   Objective: Vital signs in last 24 hours: Temp:  [97.1 F (36.2 C)-98.6 F (37 C)] 97.4 F (36.3 C) (11/10 0810) Pulse Rate:  [40-101] 65 (11/10 0830) Resp:  [13-26] 18 (11/10 0830) BP: (70-155)/(42-81) 98/54 mmHg (11/10 0830) SpO2:  [93 %-100 %] 100 % (11/10 0830) FiO2 (%):  [25 %] 25 % (11/10 0729) Weight:  [187 lb 9.8 oz (85.1 kg)] 187 lb 9.8 oz (85.1 kg) (11/10 0500) Last BM Date: 02/26/14 General:   Awake.  Unable to assess orientation. Remains on ventilator.  Eyes:  +scleral icterus Mouth:  Without lesions, mucosa pink and moist.  Abdomen:  Bowel sounds hypoactive. Abdomen distended but less tense than prior exam on 11/9, umbilical hernia noted. Generalized anasarca Extremities:  2+ pitting edema all extremities Neurologic: unable to assess. Appropriate eye contact  Intake/Output from previous day: 11/09 0701 - 11/10 0700 In: 2484.1 [I.V.:2183.8; IV Piggyback:282; TPN:18.3] Out: 4925 [Urine:4925] Intake/Output this shift:    Lab Results:  Recent Labs  03/02/14 0420 03/03/14 0424 03/04/14 0530  WBC 5.6 5.2 6.3  HGB 8.7* 9.6* 10.8*  HCT 26.3* 28.2* 31.1*  PLT 49* 58* 64*   BMET  Recent Labs  03/02/14 0836 03/03/14 0424 03/04/14 0530  NA 145 146 151*  K 4.0 3.4* 2.7*  CL 113* 113* 114*  CO2 19 19 22   GLUCOSE 89 96 122*  BUN 69* 69* 58*  CREATININE 2.89* 2.75* 2.22*  CALCIUM 8.1* 7.9* 7.8*   LFT  Recent Labs  03/02/14 0836 03/04/14 0530  PROT 5.0* 5.4*  ALBUMIN 2.8* 2.6*  AST 51* 49*  ALT 41 33  ALKPHOS 56 64  BILITOT 7.8* 8.3*   PT/INR  Recent Labs  03/03/14 0424  LABPROT 19.7*  INR 1.65*     Studies/Results: [place] Paracentesis  03/03/2014   CLINICAL DATA:  Cirrhosis, ascites ; in ICA on ventilator post variceal GI bleed post banding  EXAM: ULTRASOUND GUIDED  DIAGNOSTIC AND THERAPEUTIC PARACENTESIS  COMPARISON:  02/21/2014  PROCEDURE: Procedure, benefits, and risks of procedure were discussed with patient.  Written informed consent for procedure was obtained.  Time out protocol followed.  Adequate collection of ascites localized by ultrasound in RIGHT lower quadrant.  Skin prepped and draped in usual sterile fashion.  Skin and soft tissues anesthetized with 10 mL of 1% lidocaine.  5 [place] Yueh catheter placed into peritoneal cavity.  4000 mL of bright yellow fluid aspirated by vacuum bottle suction.  Procedure tolerated well by patient without immediate complication.  Hemostasis obtained by manual compression.  Pressure dressing was placed at conclusion of procedure.  FINDINGS: A total of approximately 4000 mL of ascitic fluid was removed. A fluid sample of 180 mL was sent for laboratory analysis.  IMPRESSION: Successful ultrasound guided paracentesis yielding 4000 mL of ascites.   Electronically Signed   By: Ulyses SouthwardMark  Boles M.D.   On: 03/03/2014 15:56   Dg Chest Port 1 View  03/02/2014   CLINICAL DATA:  Respiratory failure  EXAM: PORTABLE CHEST - 1 VIEW  COMPARISON:  03/01/2014  FINDINGS: Endotracheal tube is appropriately positioned. Left IJ central venous catheter is noted with tip over the brachiocephalic/SVC confluence. Left clavicular side plate and screws reidentified. Lungs are hypoaerated with crowding of the bronchovascular markings. Lung volumes are lower than on the prior exam with increased bibasilar atelectasis. Heart size is normal.  IMPRESSION: Decreased aeration with bilateral lower lobe atelectasis.   Electronically Signed   By: Christiana PellantGretchen  Green M.D.   On: 03/02/2014 09:37    Assessment: 58 year old male with history of ETOH/HCV cirrhosis, portal vein thrombosis on Xarelto as outpatient, presenting this admission with hematemesis and melena, acute blood loss anemia secondary to large variceal bleed. Procedure at bedside on 11/3 with 5 columns of  large varices, banding X 4. 1 varix with evidence of white nipple sign. Continues to require intubation and pressor support. Remains without any evidence of overt GI bleeding, Hgb stable/improving. Total of 4 units PRBCs this admission. TPN started 11/9. MELD 28 based on INR from yesterday. Bilirubin continues to increase.  Acute renal failure: multifactorial. Followed by Nephrology. Slowly improving.   Tense ascites: s/p paracentesis on 11/9. Fluid analysis reviewed. No evidence for SBP with cell count. Fluid culture and cytology pending. Blood cultures without growth X 1 day   Hypotension: arterial line to be placed today for more accurate assessment. Unable to tolerate weaning from pressors. Discussed briefly with Dr. Kerry HoughMemon.   Patient's condition remains guarded.   Patient's condition remains guarded with overall poor prognosis.   Plan: IV PPI BID TPN per pharmacy HFP, INR tomorrow Levaquin with last dose on 11/11 Follow-up pending fluid cultures and cytology Arterial line today per hospitalist Will continue to follow with you   Nira RetortAnna W. Sams, ANP-BC [facility]     LOS: 7 days    03/04/2014, 8:41 AM   Attending note:  Agree with above assessment and recommendations Discussed with family members and Dr. Kerry HoughMemon.

## 2014-03-04 NOTE — Progress Notes (Addendum)
PARENTERAL NUTRITION CONSULT NOTE  Pharmacy Consult for TPN Indication: malnutrition  No Known Allergies  Patient Measurements: Height: 5\' 8"  (172.7 cm) Weight: 187 lb 9.8 oz (85.1 kg) IBW/kg (Calculated) : 68.4 Adjusted Body Weight: 70Kg Usual Weight: 71Kg  Vital Signs: Temp: 98.6 F (37 C) (11/10 0400) Temp Source: Axillary (11/10 0400) BP: 93/54 mmHg (11/10 0728) Pulse Rate: 44 (11/10 0515) Intake/Output from previous day: 11/09 0701 - 11/10 0700 In: 2484.1 [I.V.:2183.8; IV Piggyback:282; TPN:18.3] Out: 4925 [Urine:4925] Intake/Output from this shift:    Labs:  Recent Labs  03/02/14 0420 03/03/14 0424 03/04/14 0530  WBC 5.6 5.2 6.3  HGB 8.7* 9.6* 10.8*  HCT 26.3* 28.2* 31.1*  PLT 49* 58* 64*  INR  --  1.65*  --      Recent Labs  03/02/14 0836 03/03/14 0424 03/04/14 0530  NA 145 146 151*  K 4.0 3.4* 2.7*  CL 113* 113* 114*  CO2 19 19 22   GLUCOSE 89 96 122*  BUN 69* 69* 58*  CREATININE 2.89* 2.75* 2.22*  CALCIUM 8.1* 7.9* 7.8*  MG  --   --  1.4*  PHOS  --   --  3.6  PROT 5.0*  --  5.4*  ALBUMIN 2.8*  --  2.6*  AST 51*  --  49*  ALT 41  --  33  ALKPHOS 56  --  64  BILITOT 7.8*  --  8.3*  TRIG  --   --  87   Estimated Creatinine Clearance: 38.5 mL/min (by C-G formula based on Cr of 2.22).   No results for input(s): GLUCAP in the last 72 hours.  Medical History: Past Medical History  Diagnosis Date  . Malnutrition of moderate degree   . Altered mental status   . Unspecified essential hypertension   . Anxiety state, unspecified   . Transient ischemic attack (TIA), and cerebral infarction without residual deficits(V12.54)   . Body mass index between 19-24, adult   . Atrial fibrillation     episode at [place].  Marland Kitchen. Heart murmur   . Obstructive sleep apnea (adult) (pediatric)     to start back after surgery- not used for 2-783months  . GERD (gastroesophageal reflux disease)   . Unspecified epilepsy without mention of intractable epilepsy  13    only once  . Hand laceration 80    end of all fingers on rt hand  . Old myocardial infarction   . Asthma     "outgrew it"  . Borderline diabetes   . 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 Acute alcoholic hepatitis     possible hepatic encephalopathy May 10, 2011  . Daily headache   . Migraine     "daily" (08/30/2013)  . Stroke ~ 2000    "mild"  . Arthritis     "I'm ate up w/it" (08/30/2013)  . Chronic lower back pain   . On home oxygen therapy     "2L prn" (08/30/2013)  . Chronic kidney disease     "one isn't functioning like it should" (08/30/2013)  . Cirrhosis   . Esophageal varices   . Thrombus     chronic,junction of splenic portal vein  . Pulmonary nodules     chronic an new 2015  . Cataract    Medications:  Scheduled:  . antiseptic oral rinse  7 mL Mouth Rinse QID  . chlorhexidine  15 mL Mouth Rinse BID  . folic acid  1  mg Oral Daily  . furosemide  160 mg Intravenous BID  . lactulose  20 g Oral BID  . levofloxacin (LEVAQUIN) IV  750 mg Intravenous Q48H  . pantoprazole (PROTONIX) IV  40 mg Intravenous BID  . potassium chloride  10 mEq Intravenous Q1 Hr x 4  . sodium chloride  3 mL Intravenous Q12H   Insulin Requirements in the past 24 hours:  none  Current Nutrition:  Initiating TPN  Assessment: 58yo male with h/o cirrhosis and moderate malnutrition.  He was admitted with acute esophageal bleeding which required banding on 02/24/2014.  S/p paracentesis 11/9.  Pt remains intubated on ventilator.   Tolerating TPN.  Labs reviewed- Scr improved, CaPhos product ok.  Potassium & Magnesium levels are low.  Na is elevated.  Glucose remains normal.  TGs normal.    Estimated Nutritional Needs:  1777 kCal, 110 grams of protein per day  Plan:   Increase Clinimix E 5/15 to 7660ml/hr  Provide MVI, trace elements, and Lipids daily with TPN  Monitor labs, renal fxn, fluid status, and glucose tolerance  Magnesium 2gm IV x1 dose today;  Kruns as ordered by MD  Titrate TPN to goal rate as tolerated   Maricela Schreur, Mercy RidingAndrea Michelle 03/04/2014,7:56 AM

## 2014-03-04 NOTE — Progress Notes (Signed)
[facility] PROGRESS NOTE  Darren DonHerbert E Abbott VHQ:[id number] DOB: 03/02/1956 DOA: 03/12/2014 PCP: Augustine RadarOBERSON, KRISTINA, MD  Summary:  This is a 58 year old gentleman with known history of alcoholic/hepatitis C cirrhosis and varices presents to the hospital with GI bleeding and abdominal pain. Shortly after admission, he required intubation for airway protection and became hypotensive requiring vasopressors. He was transfused PRBC/FFP and had urgent endoscopy done which showed GI bleed due to esophageal varix. He completed an octreotide infusion and is continued on proton pump inhibitors. It is felt that if he rebleeds, he will likely require TIPS procedure. Due to his persistent hypotension, he has developed acute renal failure. He is now on intravenous fluids and high-dose Lasix. Creatinine appears to be improving today. Since he has continued to require vasopressor support, he has not been hemodynamically stable for extubation thus far. Attempts are being made to wean off vasopressors. He has been started on steroids, cortisol is being checked, checking CVP in place an arterial line for accurate blood pressures. I anticipate he'll be in the hospital for several more days.  Assessment/Plan: 1. Acute esophageal variceal GI bleeding. Status post endoscopy with results noted as below. He was transfused 4 units of PRBCs and 2 units of FFP. Hemoglobin appears to be stable. He has completed course of octreotide infusion, continue proton pump inhibitors. Bleeding was further complicated by anticoagulation which is currently on hold. Continue to monitor hemoglobin. Since patient was having significant hematemesis on admission, he did require intubation for airway protection. Appreciate pulmonology assistance with vent management. He needs to be hemodynamically stable prior to extubation. 2. Acute blood loss anemia. Transfused 4 units PRBCs and 2 units of FFP. Continue to follow hemoglobin. Appears stable at this  time. 3. Hemorrhagic shock. Related to #1 and #2. Vasopressor changed from levophed to neo-synephrine. Attempts are being made to wean this off. Echo shows normal EF, blood cultures pending. Will check CVP and place arterial line for more accurate blood pressure monitoring. Check cortisol and start empiric hydrocortisone. 4. History of alcoholic/hepatitis C cirrhosis. He will complete a course of IV levaquin which is to complete 11/11. He underwent paracentesis yesterday with removal of 4L of fluid..  5. History of portal vein thrombosis. Not a candidate for anticoagulation. 6. History of paroxysmal atrial fibrillation. Not a candidate for anticoagulation 7. 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 Patient received vit K and INR has improved since admission, although still elevated. 8. Acute renal failure. Appreciate nephrology assistance. Felt to be ATN related to hypotension. Creatinine appears to be improving.  His urine output is improving. He is on intravenous fluids, and lasix. Continue to follow urine output. 9. Nutrition. Started on TPN 10. Hypernatremia, will adjust free water in IV fluids 11. Hypokalemia, likely related to lasix, replace  Code Status: full code Family Communication: discussed with daughter over the phone Disposition Plan: pending further hospital course   Consultants:  GI  Pulmonology  nephrology  Procedures: EGD: ESOPHAGUS: There were 5 columns of large varices in the lower third of the esophagus. There WAS ONE VARIX WITH evidence of a white nipple sign. 4 BANDING SITES IN DISTAL ESOPHAGUS. COMPLICATIONS: There were no immediate complications. PT VOMITED  BLOOD DURING PROCEDURE.  Echo:- Normal LV wall thickness with LVEF 60-65%, normal diastolic function. Mild left atrial enlargement. Mildly thickened mitral valve with bowing but no frank prolapse, trivial mtiral regugitation. Sclerotic aortic  valve, noderately calcified noncoronary cusp. Unable to assess PASP.  Antibiotics:  levaquin 02/27/14>>03/05/14  HPI/Subjective: Patient is intubated and sedated. No reported episodes of bleeding.   Objective: Filed Vitals:   03/04/14 0845  BP: 104/60  Pulse: 57  Temp:   Resp: 14    Intake/Output Summary (Last 24 hours) at 03/04/14 1011 Last data filed at 03/04/14 0500  Gross per 24 hour  Intake 2459.09 ml  Output   4525 ml  Net -2065.91 ml   Filed Weights   03/02/14 0500 03/03/14 0500 03/04/14 0500  Weight: 84.2 kg (185 lb 10 oz) 85.2 kg (187 lb 13.3 oz) 85.1 kg (187 lb 9.8 oz)    Exam:   General:  Sedated, on vent  Cardiovascular: s1, s2 rrr  Respiratory: cta b  Abdomen: soft, nt, distended, bs+  Musculoskeletal: 1-2+ edema b/l   Data Reviewed: Basic Metabolic Panel:  Recent Labs Lab 02/28/14 0159 03/01/14 0613 03/02/14 0836 03/03/14 0424 03/04/14 0530  NA 138 143 145 146 151*  K 4.4 4.5 4.0 3.4* 2.7*  CL 109 112 113* 113* 114*  CO2 19 18* 19 19 22   GLUCOSE 88 85 89 96 122*  BUN 62* 65* 69* 69* 58*  CREATININE 2.56* 2.85* 2.89* 2.75* 2.22*  CALCIUM 7.3* 7.7* 8.1* 7.9* 7.8*  MG  --   --   --   --  1.4*  PHOS  --   --   --   --  3.6   Liver Function Tests:  Recent Labs Lab 02-27-2014 1040 02/26/14 0828 02/27/14 0159 03/02/14 0836 03/04/14 0530  AST 206* 186* 231* 51* 49*  ALT 71* 72* 98* 41 33  ALKPHOS 82 80 75 56 64  BILITOT 4.3* 4.5* 4.5* 7.8* 8.3*  PROT 4.9* 4.5* 4.8* 5.0* 5.4*  ALBUMIN 1.9* 1.9* 2.0* 2.8* 2.6*    Recent Labs Lab 2014/02/27 1040  LIPASE 44    Recent Labs Lab February 27, 2014 1040 03/01/14 0957 03/02/14 0420 03/03/14 0424  AMMONIA 45 68* 66* 62*   CBC:  Recent Labs Lab 02/27/2014 1040  02/28/14 0159  03/01/14 0613 03/01/14 1350 03/02/14 0420 03/03/14 0424 03/04/14 0530  WBC 12.1*  < > 8.7  --  6.7  --  5.6 5.2 6.3  NEUTROABS 9.8*  --   --   --  4.3  --   --   --  4.2  HGB 7.0*  < > 9.9*  < > 8.9* 8.8*  8.7* 9.6* 10.8*  HCT 21.3*  < > 29.4*  < > 26.8* 26.0* 26.3* 28.2* 31.1*  MCV 94.7  < > 89.1  --  90.2  --  90.4 90.4 89.4  PLT 94*  < > 68*  --  49*  --  49* 58* 64*  < > = values in this interval not displayed. Cardiac Enzymes: No results for input(s): CKTOTAL, CKMB, CKMBINDEX, TROPONINI in the last 168 hours. BNP (last 3 results) No results for input(s): PROBNP in the last 8760 hours. CBG:  Recent Labs Lab 02/28/14 1102  GLUCAP 81    Recent Results (from the past 240 hour(s))  MRSA PCR Screening     Status: None   Collection Time: 2014-02-27  2:15 PM  Result Value Ref Range Status   MRSA by PCR NEGATIVE NEGATIVE Final    Comment:        The GeneXpert MRSA Assay (FDA approved for NASAL specimens only), is one component of a comprehensive MRSA colonization surveillance program. It is not intended to diagnose MRSA infection nor to guide or monitor treatment for MRSA infections.  Culture, blood (routine x 2)     Status: None (Preliminary result)   Collection Time: 03/02/14  9:47 AM  Result Value Ref Range Status   Specimen Description PORTA CATH  Final   Special Requests BOTTLES DRAWN AEROBIC AND ANAEROBIC 6CC  Final   Culture NO GROWTH 1 DAY  Final   Report Status PENDING  Incomplete  Culture, blood (routine x 2)     Status: None (Preliminary result)   Collection Time: 03/02/14  9:53 AM  Result Value Ref Range Status   Specimen Description RIGHT ANTECUBITAL  Final   Special Requests BOTTLES DRAWN AEROBIC AND ANAEROBIC 10CC  Final   Culture NO GROWTH 1 DAY  Final   Report Status PENDING  Incomplete     Studies: [place] Paracentesis  03/03/2014   CLINICAL DATA:  Cirrhosis, ascites ; in ICA on ventilator post variceal GI bleed post banding  EXAM: ULTRASOUND GUIDED DIAGNOSTIC AND THERAPEUTIC PARACENTESIS  COMPARISON:  02/21/2014  PROCEDURE: Procedure, benefits, and risks of procedure were discussed with patient.  Written informed consent for procedure was obtained.  Time out  protocol followed.  Adequate collection of ascites localized by ultrasound in RIGHT lower quadrant.  Skin prepped and draped in usual sterile fashion.  Skin and soft tissues anesthetized with 10 mL of 1% lidocaine.  5 [place] Yueh catheter placed into peritoneal cavity.  4000 mL of bright yellow fluid aspirated by vacuum bottle suction.  Procedure tolerated well by patient without immediate complication.  Hemostasis obtained by manual compression.  Pressure dressing was placed at conclusion of procedure.  FINDINGS: A total of approximately 4000 mL of ascitic fluid was removed. A fluid sample of 180 mL was sent for laboratory analysis.  IMPRESSION: Successful ultrasound guided paracentesis yielding 4000 mL of ascites.   Electronically Signed   By: Ulyses SouthwardMark  Boles M.D.   On: 03/03/2014 15:56    Scheduled Meds: . antiseptic oral rinse  7 mL Mouth Rinse QID  . chlorhexidine  15 mL Mouth Rinse BID  . folic acid  1 mg Oral Daily  . furosemide  160 mg Intravenous BID  . hydrocortisone sod succinate (SOLU-CORTEF) inj  100 mg Intravenous Q8H  . insulin aspart  0-9 Units Subcutaneous 4 times per day  . lactulose  20 g Oral BID  . levofloxacin (LEVAQUIN) IV  750 mg Intravenous Q48H  . magnesium sulfate 1 - 4 g bolus IVPB  2 g Intravenous Once  . pantoprazole (PROTONIX) IV  40 mg Intravenous BID  . potassium chloride  10 mEq Intravenous Q1 Hr x 4  . sodium chloride  3 mL Intravenous Q12H   Continuous Infusions: . dextrose 5 % and 0.45 % NaCl with KCl 20 mEq/L    . Marland Kitchen.TPN (CLINIMIX-E) Adult 40 mL/hr at 03/03/14 1738   And  . fat emulsion 250 mL (03/03/14 1738)  . Marland Kitchen.TPN (CLINIMIX-E) Adult     And  . fat emulsion    . fentaNYL infusion INTRAVENOUS 100 mcg/hr (03/04/14 0751)  . phenylephrine (NEO-SYNEPHRINE) Adult infusion 60 mcg/min (03/04/14 0610)    Principal Problem:   Upper GI bleed Active Problems:   Abdominal pain   Hepatitis C   Esophageal varices   Cirrhosis   GIB (gastrointestinal bleeding)    Hemorrhagic shock   Acute renal failure   ATN (acute tubular necrosis)   Acute respiratory failure    Time spent: critical care time: 30 mins    MEMON,JEHANZEB  [facility] Pager [phone]. If 7PM-7AM, please contact  night-coverage at [email or web address], password [facility] 03/04/2014, 10:11 AM  LOS: 7 days

## 2014-03-05 ENCOUNTER — Other Ambulatory Visit (HOSPITAL_COMMUNITY): Payer: Self-pay

## 2014-03-05 ENCOUNTER — Ambulatory Visit (HOSPITAL_COMMUNITY): Payer: Self-pay | Admitting: Oncology

## 2014-03-05 DIAGNOSIS — B182 Chronic viral hepatitis C: Secondary | ICD-10-CM | POA: Insufficient documentation

## 2014-03-05 DIAGNOSIS — R188 Other ascites: Secondary | ICD-10-CM

## 2014-03-05 LAB — BASIC METABOLIC PANEL
Anion gap: 14 (ref 5–15)
BUN: 51 mg/dL — AB (ref 6–23)
CHLORIDE: 111 meq/L (ref 96–112)
CO2: 25 mEq/L (ref 19–32)
Calcium: 7.7 mg/dL — ABNORMAL LOW (ref 8.4–10.5)
Creatinine, Ser: 1.78 mg/dL — ABNORMAL HIGH (ref 0.50–1.35)
GFR calc Af Amer: 47 mL/min — ABNORMAL LOW (ref >90)
GFR calc non Af Amer: 40 mL/min — ABNORMAL LOW (ref >90)
GLUCOSE: 159 mg/dL — AB (ref 70–99)
Potassium: 2.9 mEq/L — CL (ref 3.7–5.3)
Sodium: 150 mEq/L — ABNORMAL HIGH (ref 137–147)

## 2014-03-05 LAB — BLOOD GAS, ARTERIAL
ACID-BASE DEFICIT: 1.2 mmol/L (ref 0.0–2.0)
Bicarbonate: 22.9 mEq/L (ref 20.0–24.0)
DRAWN BY: 317771
FIO2: 0.25 %
MECHVT: 550 mL
O2 Saturation: 96.8 %
PEEP: 5 cmH2O
PO2 ART: 90.6 mmHg (ref 80.0–100.0)
Patient temperature: 37
RATE: 14 resp/min
TCO2: 21 mmol/L (ref 0–100)
pCO2 arterial: 37.1 mmHg (ref 35.0–45.0)
pH, Arterial: 7.407 (ref 7.350–7.450)

## 2014-03-05 LAB — GLUCOSE, CAPILLARY
GLUCOSE-CAPILLARY: 147 mg/dL — AB (ref 70–99)
GLUCOSE-CAPILLARY: 169 mg/dL — AB (ref 70–99)
GLUCOSE-CAPILLARY: 172 mg/dL — AB (ref 70–99)
Glucose-Capillary: 159 mg/dL — ABNORMAL HIGH (ref 70–99)

## 2014-03-05 LAB — PROTIME-INR
INR: 1.76 — AB (ref 0.00–1.49)
Prothrombin Time: 20.6 seconds — ABNORMAL HIGH (ref 11.6–15.2)

## 2014-03-05 LAB — CBC
HEMATOCRIT: 31.5 % — AB (ref 39.0–52.0)
Hemoglobin: 10.6 g/dL — ABNORMAL LOW (ref 13.0–17.0)
MCH: 30.4 pg (ref 26.0–34.0)
MCHC: 33.7 g/dL (ref 30.0–36.0)
MCV: 90.3 fL (ref 78.0–100.0)
Platelets: 63 10*3/uL — ABNORMAL LOW (ref 150–400)
RBC: 3.49 MIL/uL — ABNORMAL LOW (ref 4.22–5.81)
RDW: 18.3 % — ABNORMAL HIGH (ref 11.5–15.5)
WBC: 6.9 10*3/uL (ref 4.0–10.5)

## 2014-03-05 LAB — HEPATIC FUNCTION PANEL
ALBUMIN: 2.2 g/dL — AB (ref 3.5–5.2)
ALT: 26 U/L (ref 0–53)
AST: 41 U/L — ABNORMAL HIGH (ref 0–37)
Alkaline Phosphatase: 60 U/L (ref 39–117)
BILIRUBIN INDIRECT: 2 mg/dL — AB (ref 0.3–0.9)
Bilirubin, Direct: 4.5 mg/dL — ABNORMAL HIGH (ref 0.0–0.3)
TOTAL PROTEIN: 5.2 g/dL — AB (ref 6.0–8.3)
Total Bilirubin: 6.5 mg/dL — ABNORMAL HIGH (ref 0.3–1.2)

## 2014-03-05 LAB — MAGNESIUM: Magnesium: 1.6 mg/dL (ref 1.5–2.5)

## 2014-03-05 MED ORDER — POTASSIUM CHLORIDE 10 MEQ/100ML IV SOLN
10.0000 meq | INTRAVENOUS | Status: AC
  Administered 2014-03-05 (×4): 10 meq via INTRAVENOUS
  Filled 2014-03-05 (×4): qty 100

## 2014-03-05 MED ORDER — SODIUM CHLORIDE 0.9 % IV SOLN: INTRAVENOUS | Status: DC | PRN

## 2014-03-05 MED ORDER — TRACE MINERALS CR-CU-F-FE-I-MN-MO-SE-ZN IV SOLN
INTRAVENOUS | Status: AC
  Administered 2014-03-05: 18:00:00 via INTRAVENOUS
  Filled 2014-03-05: qty 2000

## 2014-03-05 MED ORDER — ONDANSETRON HCL 4 MG/2ML IJ SOLN
4.0000 mg | Freq: Four times a day (QID) | INTRAMUSCULAR | Status: DC | PRN
  Administered 2014-03-06 – 2014-03-09 (×2): 4 mg via INTRAVENOUS
  Filled 2014-03-05 (×2): qty 2

## 2014-03-05 MED ORDER — FAT EMULSION 20 % IV EMUL
250.0000 mL | INTRAVENOUS | Status: AC
  Administered 2014-03-05: 250 mL via INTRAVENOUS
  Filled 2014-03-05: qty 250

## 2014-03-05 NOTE — Progress Notes (Addendum)
Subjective: Alert, more awake today. Opens and closes eyes on command. Nods head appropriately to questions. Heart rate dropping overnight. Daughter at bedside. No GI bleeding.    Objective: Vital signs in last 24 hours: Temp:  [97 F (36.1 C)-97.9 F (36.6 C)] 97.8 F (36.6 C) (11/11 0822) Pulse Rate:  [40-94] 54 (11/11 0800) Resp:  [12-26] 14 (11/11 0800) BP: (78-103)/(43-79) 83/57 mmHg (11/11 0800) SpO2:  [96 %-100 %] 99 % (11/11 0800) Arterial Line BP: (68-119)/(40-65) 91/46 mmHg (11/11 0500) FiO2 (%):  [25 %] 25 % (11/11 0739) Weight:  [174 lb 13.2 oz (79.3 kg)] 174 lb 13.2 oz (79.3 kg) (11/11 0500) Last BM Date: 03/22/2014 General:   Alert with eyes open, blinks eyes and nods head appropriately Eyes:  +icterus  Abdomen:  Bowel sounds hypoactive, distended but soft, +umbilical hernia noted Extremities:  2+ lower extremity edema, improved from prior exam, upper extremities less edematous Skin:  Jaundiced   Intake/Output from previous day: 11/10 0701 - 11/11 0700 In: 2090.4 [I.V.:1008.4; IV Piggyback:582; TPN:500] Out: 5225 [Urine:5225] Intake/Output this shift:    Lab Results:  Recent Labs  03/03/14 0424 03/04/14 0530 03/05/14 0357  WBC 5.2 6.3 6.9  HGB 9.6* 10.8* 10.6*  HCT 28.2* 31.1* 31.5*  PLT 58* 64* 63*   BMET  Recent Labs  03/03/14 0424 03/04/14 0530 03/05/14 0357  NA 146 151* 150*  K 3.4* 2.7* 2.9*  CL 113* 114* 111  CO2 19 22 25   GLUCOSE 96 122* 159*  BUN 69* 58* 51*  CREATININE 2.75* 2.22* 1.78*  CALCIUM 7.9* 7.8* 7.7*   LFT  Recent Labs  03/04/14 0530  PROT 5.4*  ALBUMIN 2.6*  AST 49*  ALT 33  ALKPHOS 64  BILITOT 8.3*   PT/INR  Recent Labs  03/03/14 0424  LABPROT 19.7*  INR 1.65*     Studies/Results: [place] Paracentesis  03/03/2014   CLINICAL DATA:  Cirrhosis, ascites ; in ICA on ventilator post variceal GI bleed post banding  EXAM: ULTRASOUND GUIDED DIAGNOSTIC AND THERAPEUTIC PARACENTESIS  COMPARISON:  02/21/2014   PROCEDURE: Procedure, benefits, and risks of procedure were discussed with patient.  Written informed consent for procedure was obtained.  Time out protocol followed.  Adequate collection of ascites localized by ultrasound in RIGHT lower quadrant.  Skin prepped and draped in usual sterile fashion.  Skin and soft tissues anesthetized with 10 mL of 1% lidocaine.  5 [place] Yueh catheter placed into peritoneal cavity.  4000 mL of bright yellow fluid aspirated by vacuum bottle suction.  Procedure tolerated well by patient without immediate complication.  Hemostasis obtained by manual compression.  Pressure dressing was placed at conclusion of procedure.  FINDINGS: A total of approximately 4000 mL of ascitic fluid was removed. A fluid sample of 180 mL was sent for laboratory analysis.  IMPRESSION: Successful ultrasound guided paracentesis yielding 4000 mL of ascites.   Electronically Signed   By: Ulyses SouthwardMark  Boles M.D.   On: 03/03/2014 15:56    Assessment: 58 year old male with history of ETOH/HCV cirrhosis, portal vein thrombosis on Xarelto as outpatient, presenting this admission with hematemesis and melena, acute blood loss anemia secondary to large variceal bleed. Procedure at bedside on 11/3 with 5 columns of large varices, banding X 4. 1 varix with evidence of white nipple sign. Continues to require intubation and pressor support. Remains without any evidence of overt GI bleeding, Hgb stable/improving. Total of 4 units PRBCs this admission. TPN started 11/9. MELD 28 on 11/10.   Acute  renal failure: multifactorial. Followed by Nephrology. Slowly improving.   Tense ascites: s/p paracentesis on 11/9. Fluid analysis reviewed. No evidence for SBP with cell count. Fluid culture with no growth X 1 day and cytology pending. Blood cultures without growth thus far.   Hypotension: arterial line placed on 11/10. Query anasarca as culprit for possible inaccurate cuff pressures. BP improved. Hopeful weaning of pressors soon.    Patient's condition remains guarded; he remains without evidence of recurrent GI bleeding.   Plan: IV PPI BID TPN per pharmacy HFP, INR today Levaquin with last dose today Follow-up final fluid cultures and cytology Hopeful weaning of pressor support today Will continue to follow with you   Nira RetortAnna W. Sams, ANP-BC [facility]     LOS: 8 days    03/05/2014, 9:04 AM   Attending note:  Nonbloody emesis 2 earlier this afternoon. Nursing staff states 4 bands in the vomitus. Remains on a low-dose of Neo-Synephrine. No fentanyl since this morning. Timing of extubation per Dr. Juanetta GoslingHawkins.

## 2014-03-05 NOTE — Progress Notes (Signed)
Subjective: He remains intubated and on the ventilator. He is still requiring Neo-Synephrine. His heart rate has dropped several times during the night which is thought to be related to fentanyl. This morning his fentanyl drip is off and he is responsive and his blood pressure is approximately 076 systolic.  Objective: Vital signs in last 24 hours: Temp:  [97 F (36.1 C)-97.9 F (36.6 C)] 97.8 F (36.6 C) (11/11 0822) Pulse Rate:  [40-94] 43 (11/11 0600) Resp:  [12-26] 14 (11/11 0600) BP: (78-104)/(43-79) 94/52 mmHg (11/11 0600) SpO2:  [96 %-100 %] 96 % (11/11 0600) Arterial Line BP: (68-119)/(40-65) 91/46 mmHg (11/11 0500) FiO2 (%):  [25 %] 25 % (11/11 0739) Weight:  [79.3 kg (174 lb 13.2 oz)] 79.3 kg (174 lb 13.2 oz) (11/11 0500) Weight change: -5.8 kg (-12 lb 12.6 oz) Last BM Date: 02/23/2014  Intake/Output from previous day: 11/10 0701 - 11/11 0700 In: 2090.4 [I.V.:1008.4; IV Piggyback:582; TPN:500] Out: 5225 [Urine:5225]  PHYSICAL EXAM General appearance: intubated on the ventilator but responsive Resp: rhonchi bilaterally Cardio: regular rate and rhythm, S1, S2 normal, no murmur, click, rub or gallop GI: protuberant with active bowel sounds Extremities: extremities normal, atraumatic, no cyanosis or edema  Lab Results:  Results for orders placed or performed during the hospital encounter of 03/02/2014 (from the past 48 hour(s))  Body fluid culture     Status: None (Preliminary result)   Collection Time: 03/03/14  3:28 PM  Result Value Ref Range   Specimen Description ABDOMEN    Special Requests NONE    Gram Stain      NO WBC SEEN NO ORGANISMS SEEN Performed at [facility]    Culture PENDING    Report Status PENDING   Body fluid cell count with differential     Status: Abnormal   Collection Time: 03/03/14  3:30 PM  Result Value Ref Range   Fluid Type-FCT Body Fluid    Color, Fluid YELLOW YELLOW   Appearance, Fluid CLEAR (A) CLEAR   WBC, Fluid 108 0 - 1000  cu mm   Neutrophil Count, Fluid 48 (H) 0 - 25 %   Lymphs, Fluid 25 %   Monocyte-Macrophage-Serous Fluid 27 (L) 50 - 90 %   Eos, Fluid 0 %   Other Cells, Fluid FEW %    Comment: OTHER CELLS IDENTIFIED AS MESOTHELIAL CELLS FEW REACTIVE CELLS SEEN.   Blood gas, arterial     Status: Abnormal   Collection Time: 03/04/14  5:00 AM  Result Value Ref Range   FIO2 0.25 %   Delivery systems VENTILATOR    Mode PRESSURE REGULATED VOLUME CONTROL    VT 550 mL   Rate 14.0 resp/min   Peep/cpap 5.0 cm H20   pH, Arterial 7.405 7.350 - 7.450   pCO2 arterial 32.7 (L) 35.0 - 45.0 mmHg   pO2, Arterial 79.5 (L) 80.0 - 100.0 mmHg   Bicarbonate 20.1 20.0 - 24.0 mEq/L   TCO2 18.3 0 - 100 mmol/L   Acid-base deficit 3.8 (H) 0.0 - 2.0 mmol/L   O2 Saturation 95.4 %   Patient temperature 37.0    Collection site RIGHT RADIAL    Drawn by [id number]    Sample type ARTERIAL DRAW    Allens test (pass/fail) PASS PASS  Comprehensive metabolic panel     Status: Abnormal   Collection Time: 03/04/14  5:30 AM  Result Value Ref Range   Sodium 151 (H) 137 - 147 mEq/L   Potassium 2.7 (LL) 3.7 - 5.3  mEq/L    Comment: CRITICAL RESULT CALLED TO, READ BACK BY AND VERIFIED WITH: WAGONER,R AT 6:30AM ON 03/04/14 BY FESTERMAN,C    Chloride 114 (H) 96 - 112 mEq/L   CO2 22 19 - 32 mEq/L   Glucose, Bld 122 (H) 70 - 99 mg/dL   BUN 58 (H) 6 - 23 mg/dL   Creatinine, Ser 2.22 (H) 0.50 - 1.35 mg/dL   Calcium 7.8 (L) 8.4 - 10.5 mg/dL   Total Protein 5.4 (L) 6.0 - 8.3 g/dL   Albumin 2.6 (L) 3.5 - 5.2 g/dL   AST 49 (H) 0 - 37 U/L   ALT 33 0 - 53 U/L   Alkaline Phosphatase 64 39 - 117 U/L   Total Bilirubin 8.3 (H) 0.3 - 1.2 mg/dL   GFR calc non Af Amer 31 (L) >90 mL/min   GFR calc Af Amer 36 (L) >90 mL/min    Comment: (NOTE) The eGFR has been calculated using the CKD EPI equation. This calculation has not been validated in all clinical situations. eGFR's persistently <90 mL/min signify possible Chronic Kidney Disease.    Anion  gap 15 5 - 15  Prealbumin     Status: Abnormal   Collection Time: 03/04/14  5:30 AM  Result Value Ref Range   Prealbumin 4.6 (L) 17.0 - 34.0 mg/dL    Comment: Performed at [facility]  Magnesium     Status: Abnormal   Collection Time: 03/04/14  5:30 AM  Result Value Ref Range   Magnesium 1.4 (L) 1.5 - 2.5 mg/dL  Phosphorus     Status: None   Collection Time: 03/04/14  5:30 AM  Result Value Ref Range   Phosphorus 3.6 2.3 - 4.6 mg/dL  Triglycerides     Status: None   Collection Time: 03/04/14  5:30 AM  Result Value Ref Range   Triglycerides 87 <150 mg/dL  CBC     Status: Abnormal   Collection Time: 03/04/14  5:30 AM  Result Value Ref Range   WBC 6.3 4.0 - 10.5 K/uL   RBC 3.48 (L) 4.22 - 5.81 MIL/uL   Hemoglobin 10.8 (L) 13.0 - 17.0 g/dL   HCT 31.1 (L) 39.0 - 52.0 %   MCV 89.4 78.0 - 100.0 fL   MCH 31.0 26.0 - 34.0 pg   MCHC 34.7 30.0 - 36.0 g/dL   RDW 18.3 (H) 11.5 - 15.5 %   Platelets 64 (L) 150 - 400 K/uL    Comment: SPECIMEN CHECKED FOR CLOTS CONSISTENT WITH PREVIOUS RESULT   Differential     Status: Abnormal   Collection Time: 03/04/14  5:30 AM  Result Value Ref Range   Neutrophils Relative % 66 43 - 77 %   Neutro Abs 4.2 1.7 - 7.7 K/uL   Lymphocytes Relative 11 (L) 12 - 46 %   Lymphs Abs 0.7 0.7 - 4.0 K/uL   Monocytes Relative 17 (H) 3 - 12 %   Monocytes Absolute 1.1 (H) 0.1 - 1.0 K/uL   Eosinophils Relative 5 0 - 5 %   Eosinophils Absolute 0.3 0.0 - 0.7 K/uL   Basophils Relative 1 0 - 1 %   Basophils Absolute 0.0 0.0 - 0.1 K/uL  Cortisol     Status: None   Collection Time: 03/04/14 10:31 AM  Result Value Ref Range   Cortisol, Plasma 16.8 ug/dL    Comment: (NOTE) AM:  4.3 - 22.4 ug/dL PM:  3.1 - 16.7 ug/dL Performed at [facility]   Glucose,  capillary     Status: Abnormal   Collection Time: 03/04/14 12:07 PM  Result Value Ref Range   Glucose-Capillary 113 (H) 70 - 99 mg/dL   Comment 1 Notify RN   Glucose, capillary     Status: Abnormal    Collection Time: 03/04/14  5:46 PM  Result Value Ref Range   Glucose-Capillary 141 (H) 70 - 99 mg/dL   Comment 1 Notify RN   Glucose, capillary     Status: Abnormal   Collection Time: 03/04/14 11:44 PM  Result Value Ref Range   Glucose-Capillary 169 (H) 70 - 99 mg/dL   Comment 1 Notify RN   Basic metabolic panel     Status: Abnormal   Collection Time: 03/05/14  3:57 AM  Result Value Ref Range   Sodium 150 (H) 137 - 147 mEq/L   Potassium 2.9 (LL) 3.7 - 5.3 mEq/L    Comment: CRITICAL RESULT CALLED TO, READ BACK BY AND VERIFIED WITH: WAGONER,R AT 6:00AM ON 03/05/14 BY FESTERMAN,C    Chloride 111 96 - 112 mEq/L   CO2 25 19 - 32 mEq/L   Glucose, Bld 159 (H) 70 - 99 mg/dL   BUN 51 (H) 6 - 23 mg/dL   Creatinine, Ser 1.78 (H) 0.50 - 1.35 mg/dL   Calcium 7.7 (L) 8.4 - 10.5 mg/dL   GFR calc non Af Amer 40 (L) >90 mL/min   GFR calc Af Amer 47 (L) >90 mL/min    Comment: (NOTE) The eGFR has been calculated using the CKD EPI equation. This calculation has not been validated in all clinical situations. eGFR's persistently <90 mL/min signify possible Chronic Kidney Disease.    Anion gap 14 5 - 15  Magnesium     Status: None   Collection Time: 03/05/14  3:57 AM  Result Value Ref Range   Magnesium 1.6 1.5 - 2.5 mg/dL  CBC     Status: Abnormal   Collection Time: 03/05/14  3:57 AM  Result Value Ref Range   WBC 6.9 4.0 - 10.5 K/uL   RBC 3.49 (L) 4.22 - 5.81 MIL/uL   Hemoglobin 10.6 (L) 13.0 - 17.0 g/dL   HCT 31.5 (L) 39.0 - 52.0 %   MCV 90.3 78.0 - 100.0 fL   MCH 30.4 26.0 - 34.0 pg   MCHC 33.7 30.0 - 36.0 g/dL   RDW 18.3 (H) 11.5 - 15.5 %   Platelets 63 (L) 150 - 400 K/uL    Comment: SPECIMEN CHECKED FOR CLOTS CONSISTENT WITH PREVIOUS RESULT   Blood gas, arterial     Status: None   Collection Time: 03/05/14  5:00 AM  Result Value Ref Range   FIO2 0.25 %   Delivery systems VENTILATOR    Mode PRESSURE REGULATED VOLUME CONTROL    VT 550 mL   Rate 14.0 resp/min   Peep/cpap 5.0  cm H20   pH, Arterial 7.407 7.350 - 7.450   pCO2 arterial 37.1 35.0 - 45.0 mmHg   pO2, Arterial 90.6 80.0 - 100.0 mmHg   Bicarbonate 22.9 20.0 - 24.0 mEq/L   TCO2 21.0 0 - 100 mmol/L   Acid-base deficit 1.2 0.0 - 2.0 mmol/L   O2 Saturation 96.8 %   Patient temperature 37.0    Collection site RIGHT RADIAL    Drawn by [id number]    Sample type A-LINE   Glucose, capillary     Status: Abnormal   Collection Time: 03/05/14  6:24 AM  Result Value Ref Range   Glucose-Capillary 172 (H)  70 - 99 mg/dL   Comment 1 Notify RN     ABGS  Recent Labs  03/05/14 0500  PHART 7.407  PO2ART 90.6  TCO2 21.0  HCO3 22.9   CULTURES Recent Results (from the past 240 hour(s))  MRSA PCR Screening     Status: None   Collection Time: 03/02/2014  2:15 PM  Result Value Ref Range Status   MRSA by PCR NEGATIVE NEGATIVE Final    Comment:        The GeneXpert MRSA Assay (FDA approved for NASAL specimens only), is one component of a comprehensive MRSA colonization surveillance program. It is not intended to diagnose MRSA infection nor to guide or monitor treatment for MRSA infections.   Culture, blood (routine x 2)     Status: None (Preliminary result)   Collection Time: 03/02/14  9:47 AM  Result Value Ref Range Status   Specimen Description PORTA CATH  Final   Special Requests BOTTLES DRAWN AEROBIC AND ANAEROBIC 6CC  Final   Culture NO GROWTH 1 DAY  Final   Report Status PENDING  Incomplete  Culture, blood (routine x 2)     Status: None (Preliminary result)   Collection Time: 03/02/14  9:53 AM  Result Value Ref Range Status   Specimen Description RIGHT ANTECUBITAL  Final   Special Requests BOTTLES DRAWN AEROBIC AND ANAEROBIC 10CC  Final   Culture NO GROWTH 1 DAY  Final   Report Status PENDING  Incomplete  Body fluid culture     Status: None (Preliminary result)   Collection Time: 03/03/14  3:28 PM  Result Value Ref Range Status   Specimen Description ABDOMEN  Final   Special Requests NONE  Final    Gram Stain   Final    NO WBC SEEN NO ORGANISMS SEEN Performed at [facility]    Culture PENDING  Incomplete   Report Status PENDING  Incomplete   Studies/Results: US Paracentesis  03/03/2014   CLINICAL DATA:  Cirrhosis, ascites ; in ICA on ventilator post variceal GI bleed post banding  EXAM: ULTRASOUND GUIDED DIAGNOSTIC AND THERAPEUTIC PARACENTESIS  COMPARISON:  02/21/2014  PROCEDURE: Procedure, benefits, and risks of procedure were discussed with patient.  Written informed consent for procedure was obtained.  Time out protocol followed.  Adequate collection of ascites localized by ultrasound in RIGHT lower quadrant.  Skin prepped and draped in usual sterile fashion.  Skin and soft tissues anesthetized with 10 mL of 1% lidocaine.  5 [place] Yueh catheter placed into peritoneal cavity.  4000 mL of bright yellow fluid aspirated by vacuum bottle suction.  Procedure tolerated well by patient without immediate complication.  Hemostasis obtained by manual compression.  Pressure dressing was placed at conclusion of procedure.  FINDINGS: A total of approximately 4000 mL of ascitic fluid was removed. A fluid sample of 180 mL was sent for laboratory analysis.  IMPRESSION: Successful ultrasound guided paracentesis yielding 4000 mL of ascites.   Electronically Signed   By: Lavonia Dana M.D.   On: 03/03/2014 15:56    Medications:  Prior to Admission:  Prescriptions prior to admission  Medication Sig Dispense Refill Last Dose  . folic acid (FOLVITE) 1 MG tablet Take 1 tablet (1 mg total) by mouth daily. 30 tablet 1 Past Week at Unknown time  . furosemide (LASIX) 20 MG tablet Take 1 tablet (20 mg total) by mouth daily. 30 tablet 3 Past Week at Unknown time  . Lactulose 20 GM/30ML SOLN Take 30 mLs (20 g total)  by mouth 2 (two) times daily. 240 mL 3 Past Week at Unknown time  . metoprolol (LOPRESSOR) 50 MG tablet Take 0.5 tablets (25 mg total) by mouth daily. 60 tablet 11 Past Week at Unknown time  .  Multiple Vitamins-Minerals (CENTRUM SILVER PO) Take 1 tablet by mouth daily.   Past Week at Unknown time  . ondansetron (ZOFRAN) 4 MG tablet Take 4 mg by mouth every 8 (eight) hours as needed for nausea or vomiting.   Past Week at Unknown time  . oxazepam (SERAX) 10 MG capsule Take 10 mg by mouth 2 (two) times daily.   Past Week at Unknown time  . oxyCODONE (OXY IR/ROXICODONE) 5 MG immediate release tablet Take 1 tablet (5 mg total) by mouth every 4 (four) hours as needed for severe pain. 100 tablet 0 Past Week at Unknown time  . pantoprazole (PROTONIX) 40 MG tablet Take 1 tablet (40 mg total) by mouth 2 (two) times daily before a meal. 30 tablet 1 Past Week at Unknown time  . Rivaroxaban (XARELTO) 15 MG TABS tablet Take 1 tablet (15 mg total) by mouth daily with breakfast. 30 tablet 1 Past Week at Unknown time  . spironolactone (ALDACTONE) 50 MG tablet Take 1 tablet (50 mg total) by mouth daily. 30 tablet 3 Past Week at Unknown time   Scheduled: . antiseptic oral rinse  7 mL Mouth Rinse QID  . chlorhexidine  15 mL Mouth Rinse BID  . folic acid  1 mg Oral Daily  . furosemide  160 mg Intravenous BID  . hydrocortisone sod succinate (SOLU-CORTEF) inj  100 mg Intravenous Q8H  . insulin aspart  0-9 Units Subcutaneous 4 times per day  . lactulose  20 g Oral BID  . levofloxacin (LEVAQUIN) IV  750 mg Intravenous Q48H  . pantoprazole (PROTONIX) IV  40 mg Intravenous BID  . potassium chloride  10 mEq Intravenous Q1 Hr x 4  . sodium chloride  3 mL Intravenous Q12H   Continuous: . dextrose 5 % and 0.45 % NaCl with KCl 20 mEq/L 50 mL/hr at 03/04/14 1101  . Marland KitchenTPN (CLINIMIX-E) Adult 60 mL/hr at 03/04/14 1736   And  . fat emulsion 250 mL (03/04/14 1736)  . fentaNYL infusion INTRAVENOUS 75 mcg/hr (03/04/14 1337)  . phenylephrine (NEO-SYNEPHRINE) Adult infusion 50 mcg/min (03/04/14 2319)   TFT:DDUKG/URKYHCWC arterial line **AND** sodium chloride, fentaNYL, fentaNYL, fentaNYL, LORazepam, morphine injection,  sodium chloride  Assesment:he was admitted with upper GI bleeding and required emergency treatment of esophageal varices. He is known to have cirrhosis of the liver. He developed renal failure. He was intubated for airway protection but we've not been able to get him off the ventilator because of his blood pressure issues. He is receiving parenteral nutrition. This morning his blood pressure is better and we are going to see if he can come off of pressors and then we can try to see what we can do about getting off the ventilator  Principal Problem:   Upper GI bleed Active Problems:   Abdominal pain   Hepatitis C   Esophageal varices   Cirrhosis   GIB (gastrointestinal bleeding)   Hemorrhagic shock   Acute renal failure   ATN (acute tubular necrosis)   Acute respiratory failure    Plan:as above    LOS: 8 days   Darren Abbott L 03/05/2014, 8:29 AM

## 2014-03-05 NOTE — Progress Notes (Signed)
Patient has had 2 periods episodes of vomiting that was of a bile dark Petrosian color with the first episode having bands from varices. No blood seen in either episode. Estimated total first episode was 200 ml. Second had about 75 ml. Suctioned and changed gown.

## 2014-03-05 NOTE — Progress Notes (Signed)
PARENTERAL NUTRITION CONSULT NOTE  Pharmacy Consult for TPN Indication: malnutrition  No Known Allergies  Patient Measurements: Height: 5\' 8"  (172.7 cm) Weight: 174 lb 13.2 oz (79.3 kg) IBW/kg (Calculated) : 68.4 Adjusted Body Weight: 70Kg Usual Weight: 71Kg  Vital Signs: Temp: 97.8 F (36.6 C) (11/11 0822) Temp Source: Axillary (11/11 0822) BP: 94/52 mmHg (11/11 0600) Pulse Rate: 43 (11/11 0600) Intake/Output from previous day: 11/10 0701 - 11/11 0700 In: 2090.4 [I.V.:1008.4; IV Piggyback:582; TPN:500] Out: 5225 [Urine:5225] Intake/Output from this shift:    Labs:  Recent Labs  03/03/14 0424 03/04/14 0530 03/05/14 0357  WBC 5.2 6.3 6.9  HGB 9.6* 10.8* 10.6*  HCT 28.2* 31.1* 31.5*  PLT 58* 64* 63*  INR 1.65*  --   --      Recent Labs  03/02/14 0836 03/03/14 0424 03/04/14 0530 03/05/14 0357  NA 145 146 151* 150*  K 4.0 3.4* 2.7* 2.9*  CL 113* 113* 114* 111  CO2 19 19 22 25   GLUCOSE 89 96 122* 159*  BUN 69* 69* 58* 51*  CREATININE 2.89* 2.75* 2.22* 1.78*  CALCIUM 8.1* 7.9* 7.8* 7.7*  MG  --   --  1.4* 1.6  PHOS  --   --  3.6  --   PROT 5.0*  --  5.4*  --   ALBUMIN 2.8*  --  2.6*  --   AST 51*  --  49*  --   ALT 41  --  33  --   ALKPHOS 56  --  64  --   BILITOT 7.8*  --  8.3*  --   PREALBUMIN  --   --  4.6*  --   TRIG  --   --  87  --    Estimated Creatinine Clearance: 43.8 mL/min (by C-G formula based on Cr of 1.78).    Recent Labs  03/04/14 1746 03/04/14 2344 03/05/14 0624  GLUCAP 141* 169* 172*    Medical History: Past Medical History  Diagnosis Date  . Malnutrition of moderate degree   . Altered mental status   . Unspecified essential hypertension   . Anxiety state, unspecified   . Transient ischemic attack (TIA), and cerebral infarction without residual deficits(V12.54)   . Body mass index between 19-24, adult   . Atrial fibrillation     episode at [place].  Marland Kitchen. Heart murmur   . Obstructive sleep apnea (adult) (pediatric)      to start back after surgery- not used for 2-793months  . GERD (gastroesophageal reflux disease)   . Unspecified epilepsy without mention of intractable epilepsy 13    only once  . Hand laceration 80    end of all fingers on rt hand  . Old myocardial infarction   . Asthma     "outgrew it"  . Borderline diabetes   . 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 Acute alcoholic hepatitis     possible hepatic encephalopathy May 10, 2011  . Daily headache   . Migraine     "daily" (08/30/2013)  . Stroke ~ 2000    "mild"  . Arthritis     "I'm ate up w/it" (08/30/2013)  . Chronic lower back pain   . On home oxygen therapy     "2L prn" (08/30/2013)  . Chronic kidney disease     "one isn't functioning like it should" (08/30/2013)  . Cirrhosis   . Esophageal varices   . Thrombus  chronic,junction of splenic portal vein  . Pulmonary nodules     chronic an new 2015  . Cataract    Medications:  Scheduled:  . antiseptic oral rinse  7 mL Mouth Rinse QID  . chlorhexidine  15 mL Mouth Rinse BID  . folic acid  1 mg Oral Daily  . furosemide  160 mg Intravenous BID  . hydrocortisone sod succinate (SOLU-CORTEF) inj  100 mg Intravenous Q8H  . insulin aspart  0-9 Units Subcutaneous 4 times per day  . lactulose  20 g Oral BID  . levofloxacin (LEVAQUIN) IV  750 mg Intravenous Q48H  . pantoprazole (PROTONIX) IV  40 mg Intravenous BID  . potassium chloride  10 mEq Intravenous Q1 Hr x 4  . sodium chloride  3 mL Intravenous Q12H   Insulin Requirements in the past 24 hours:  5 units SSI  Current Nutrition:  Initiating TPN  Assessment: 58yo male with h/o cirrhosis and moderate malnutrition.  He was admitted with acute esophageal bleeding which required banding on 03/20/2014.  S/p paracentesis 11/9.  Pt remains intubated on ventilator.   Tolerating TPN.  Labs reviewed- Scr improved, CaPhos product ok.  Potassium remains low & Magnesium level now OK.  Na is elevated.   Glucose slightly elevated.  LFTs OK, TGs normal.    Estimated Nutritional Needs: Kcal: 1777  Protein: 110 gr  Fluid: 2.2 liters daily (normal needs)  Plan:   Increase Clinimix E 5/15 to 5270ml/hr  Provide MVI, trace elements, and Lipids daily with TPN  Monitor labs, renal fxn, fluid status, and glucose tolerance  Kruns as ordered by MD  Titrate TPN to goal rate as tolerated   Lamonte RicherHayes, Chelsey Redondo R 03/05/2014,8:28 AM

## 2014-03-05 NOTE — Progress Notes (Signed)
Darren Abbott  MRN: [id number]  DOB/AGE: 1955-08-10 58 y.o.  Primary Care Physician:ROBERSON, Carmell Austria, MD  Admit date: 02/28/2014  Chief Complaint:  Chief Complaint  Patient presents with  . GI Bleeding    S-Pt presented on  03/03/2014 with  Chief Complaint  Patient presents with  . GI Bleeding  .    Pt remains intubated.    I met pt Father and sister. I tried to answer their questions to best of my ability.  Meds . antiseptic oral rinse  7 mL Mouth Rinse QID  . chlorhexidine  15 mL Mouth Rinse BID  . folic acid  1 mg Oral Daily  . furosemide  160 mg Intravenous BID  . hydrocortisone sod succinate (SOLU-CORTEF) inj  100 mg Intravenous Q8H  . insulin aspart  0-9 Units Subcutaneous 4 times per day  . lactulose  20 g Oral BID  . levofloxacin (LEVAQUIN) IV  750 mg Intravenous Q48H  . pantoprazole (PROTONIX) IV  40 mg Intravenous BID  . potassium chloride  10 mEq Intravenous Q1 Hr x 4  . sodium chloride  3 mL Intravenous Q12H       Physical Exam: Vital signs in last 24 hours: Temp:  [97 F (36.1 C)-97.9 F (36.6 C)] 97.8 F (36.6 C) (11/11 0822) Pulse Rate:  [40-94] 50 (11/11 0900) Resp:  [12-26] 14 (11/11 0900) BP: (78-103)/(43-79) 86/51 mmHg (11/11 0900) SpO2:  [96 %-100 %] 98 % (11/11 0900) Arterial Line BP: (68-119)/(40-65) 91/46 mmHg (11/11 0500) FiO2 (%):  [25 %] 25 % (11/11 0739) Weight:  [174 lb 13.2 oz (79.3 kg)] 174 lb 13.2 oz (79.3 kg) (11/11 0500) Weight change: -12 lb 12.6 oz (-5.8 kg) Last BM Date: 02/24/2014  Intake/Output from previous day: 11/10 0701 - 11/11 0700 In: 2090.4 [I.V.:1008.4; IV Piggyback:582; TPN:500] Out: 5225 [Urine:5225]     Physical Exam: General- pt is intubated,sedated Resp- ET tube in situ +B/L Rhonchi CVS- S1S2 regular in rate and rhythm GIT- BS+, soft, NT, ND EXT- 2+ LE Edema,NO Cyanosis   Lab Results: CBC  Recent Labs  03/04/14 0530 03/05/14 0357  WBC 6.3 6.9  HGB 10.8* 10.6*  HCT 31.1* 31.5*  PLT 64*  63*    BMET  Recent Labs  03/04/14 0530 03/05/14 0357  NA 151* 150*  K 2.7* 2.9*  CL 114* 111  CO2 22 25  GLUCOSE 122* 159*  BUN 58* 51*  CREATININE 2.22* 1.78*  CALCIUM 7.8* 7.7*   Creat Trend 2015 0.59=>2.89=>2.75=>2.2=>1.78    MICRO Recent Results (from the past 240 hour(s))  MRSA PCR Screening     Status: None   Collection Time: 02/23/2014  2:15 PM  Result Value Ref Range Status   MRSA by PCR NEGATIVE NEGATIVE Final    Comment:        The GeneXpert MRSA Assay (FDA approved for NASAL specimens only), is one component of a comprehensive MRSA colonization surveillance program. It is not intended to diagnose MRSA infection nor to guide or monitor treatment for MRSA infections.   Culture, blood (routine x 2)     Status: None (Preliminary result)   Collection Time: 03/02/14  9:47 AM  Result Value Ref Range Status   Specimen Description PORTA CATH  Final   Special Requests BOTTLES DRAWN AEROBIC AND ANAEROBIC 6CC  Final   Culture NO GROWTH 1 DAY  Final   Report Status PENDING  Incomplete  Culture, blood (routine x 2)     Status: None (Preliminary result)  Collection Time: 03/02/14  9:53 AM  Result Value Ref Range Status   Specimen Description RIGHT ANTECUBITAL  Final   Special Requests BOTTLES DRAWN AEROBIC AND ANAEROBIC 10CC  Final   Culture NO GROWTH 1 DAY  Final   Report Status PENDING  Incomplete  Body fluid culture     Status: None (Preliminary result)   Collection Time: 03/03/14  3:28 PM  Result Value Ref Range Status   Specimen Description ABDOMEN  Final   Special Requests NONE  Final   Gram Stain   Final    NO WBC SEEN NO ORGANISMS SEEN Performed at [facility]    Culture   Final    NO GROWTH 1 DAY Performed at [facility]    Report Status PENDING  Incomplete      Lab Results  Component Value Date   CALCIUM 7.7* 03/05/2014   PHOS 3.6 03/04/2014        Impression: 1)Renal AKI secondary to ATN   ATN  sec to Hemorrhagic shock  Hypotension/anemia / Sprinololactone  AKi shows improvement   Creat trending down  2)CVs hemodynamically unstable On pressors  3)Anemia HGb at goal (9--11) S/p PRBC   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  Plan:  Will continue current care.      Samanvitha Germany S 03/05/2014, 9:58 AM

## 2014-03-05 NOTE — Progress Notes (Signed)
PROGRESS NOTE  Nelle DonHerbert E Linarez ZOX:[id number] DOB: 05/30/1955 DOA: 03/07/2014 PCP: Augustine RadarOBERSON, KRISTINA, MD  Summary: 58 year old man presented 11/3 with hematemesis. Quickly decompensated, was intubated for airway protection, placed on vasopressors for hemorrhagic shock and underwent urgent EGD at the bedside same day of admission as well as transfusion of packed red blood cells and FFP. Presumed etiology of bleeding esophageal varices, treated with octreotide infusion, PPI. Hospitalization has been prolonged by persistent hypotension of unclear etiology. Unable to wean from ventilator while hypotensive.  Continues to require vasopressor support, bilirubin has increased, renal function?  Assessment/Plan: 1. Acute respiratory failure with hypoxia, intubated 11/3 after admission for airway protection secondary to acute GI bleed, hematemesis. Weaning trial underway. Extubation held for previous hypotension. 2. GIB secondary to esophageal varices,,complicated by outpatient anticoagulation with Xarelto. Status post endoscopywith esophageal banding 11/3, octreotide infusion, transfusion 4 units packed red blood cells, 2 units FFP. If rebleeds, may need TIPS. No bleeding since admission. 3. Acute blood loss anemia secondary to GI bleed. Stable s/p transfusion shortly after admission. 4. Hemorrhagic shock secondary to GI bleed. Weaning vasopressors, it appears cuff pressures may be artificially low, not correlating with arterial line. According to chart, previous providers have noted the patient tends to run low systolic blood pressure. Echocardiogram revealed normal left ventricular function. Started on stress dose steroids 11/10. 5. Acute renal failure secondary to ATN secondary to hypotension. Hepatorenal syndrome also considered. Followed by nephrology. Creatinine improving. 6. Abdominal pain, possible SBP on admission, fluid can obtained after the fact without evidence of SBP. Completes Levaquin  today. 7. Decompensated alcoholic, hepatitis C cirrhosis with varices, coagulopathy, Thrombocytopenia, ascites. Status post paracentesis 11/9. Completes course of Levaquin 11/11. MELD 30 and Child Pugh C. 8. History of portal vein thrombosis. Not a candidate for anticoagulation secondary to GI bleed as above. 9. Paroxysmal atrial fibrillation, not a candidate for anticoagulation secondary to above. 10. Nutrition. On TNA. 11. Chronic hypoxic respiratory failure on 2 L as needed according to chart. 12. Moderate malnutrition.   Remains critically ill, intubated, but BP improved. Plan for wean vasopressors and possibly extubation today (defer to pulmonary). Continue management of renal failure per nephrology and cirrhosis per GI.   No NGT secondary to bleeding varices.  Last bowel movement on admission. Will discuss with GI.  Continue PPI twice a day, TPN.  MELD score suggests high mortality next 3 months. Prognosis long-term is poor. Short-term guarded as above. Sister-in-law updated at bedside. No POA. Will update daughter Deanna ArtisKeisha by telephone. Will discuss goals of care.  Code Status: full code DVT prophylaxis: SCDs Family Communication:  Disposition Plan: pending  Brendia Sacksaniel Skarlett Sedlacek, MD  [facility]  Pager [phone] If 7PM-7AM, please contact night-coverage at [email or web address], password [email or web address] 03/05/2014, 8:52 AM  LOS: 8 days   Consultants:  Gastroenterology  Pulmonology  Nephrology   Procedures:  Transfusion 4 units packed red blood cells  Transfusion 2 units FFP  ETT 11/3 >>  Central line catheter 11/3 >>  11/3 EGD: ENDOSCOPIC IMPRESSION: GI BLEED DUE TO ESOPHAGEAL VARIX IN PT WHO NOW AS A MELD SCORE OF 29 AND IS CHILD PUGH C. immediate complications. PT VOMITED BLOOD DURING PROCEDURE.  Large-volume paracentesis 11/9, 4 L  Echo:- Normal LV wall thickness with LVEF 60-65%, normal diastolic function. Mild left atrial enlargement. Mildly thickened mitral valve  with bowing but no frank prolapse, trivial mtiral regugitation. Sclerotic aortic valve, noderately calcified noncoronary cusp. Unable to assess PASP.  Arterial line 11/10 >>   Antibiotics:  Levaquin 11/3 >>  11/11  HPI/Subjective: No bleeding. Now off fentanyl per RN. Awake, follows commands. RN weaning vasopressors. Possible extubation today. Sister-in-law at bedside.  Objective: Filed Vitals:   03/05/14 0500 03/05/14 0600 03/05/14 0800 03/05/14 0822  BP: 82/49 94/52 83/57    Pulse: 45 43 54   Temp:    97.8 F (36.6 C)  TempSrc:    Axillary  Resp: 14 14 14    Height:      Weight: 79.3 kg (174 lb 13.2 oz)     SpO2: 97% 96% 99%     Intake/Output Summary (Last 24 hours) at 03/05/14 0852 Last data filed at 03/05/14 0500  Gross per 24 hour  Intake 1793.67 ml  Output   5225 ml  Net -3431.33 ml     Filed Weights   03/03/14 0500 03/04/14 0500 03/05/14 0500  Weight: 85.2 kg (187 lb 13.3 oz) 85.1 kg (187 lb 9.8 oz) 79.3 kg (174 lb 13.2 oz)    Exam:     Intubated. Afebrile, VSS, SBP >100 (arterial line)  General: appears calm and comfortable, ill but not toxic  Psych: alert, follows commands, moves all extremities  Eyes: appear grossly unremarkable  ENT: intubated  CV: RRR no m/r/g. No LE edema.   Respiratory: CTA bilaterally no w/r/r. Normal respiratory effort.  Abdomen: distended, non-tender, soft  Skin: grossly unremarkable  Musculoskeletal: moves all extremities to command  Neuro: grossly non-focal, exam limited.  Data Reviewed:  Urine output 5225  Last recorded bowel movement 11/3.  Capillary blood sugars stable.  Sodium 150, potassium 2.9, improved. BUN and creatinine trending downwards.total bilirubin has improved today.  Hemoglobin stable at 10.6. Platelet count stable 63.  Random cortisol was within normal limits  Scheduled Meds: . antiseptic oral rinse  7 mL Mouth Rinse QID  . chlorhexidine  15 mL Mouth Rinse BID  . folic acid  1 mg  Oral Daily  . furosemide  160 mg Intravenous BID  . hydrocortisone sod succinate (SOLU-CORTEF) inj  100 mg Intravenous Q8H  . insulin aspart  0-9 Units Subcutaneous 4 times per day  . lactulose  20 g Oral BID  . levofloxacin (LEVAQUIN) IV  750 mg Intravenous Q48H  . pantoprazole (PROTONIX) IV  40 mg Intravenous BID  . potassium chloride  10 mEq Intravenous Q1 Hr x 4  . sodium chloride  3 mL Intravenous Q12H   Continuous Infusions: . dextrose 5 % and 0.45 % NaCl with KCl 20 mEq/L 50 mL/hr at 03/04/14 1101  . Marland Kitchen.TPN (CLINIMIX-E) Adult 60 mL/hr at 03/04/14 1736   And  . fat emulsion 250 mL (03/04/14 1736)  . fentaNYL infusion INTRAVENOUS 75 mcg/hr (03/04/14 1337)  . phenylephrine (NEO-SYNEPHRINE) Adult infusion 50 mcg/min (03/04/14 2319)    Principal Problem:   Upper GI bleed Active Problems:   Abdominal pain   Hepatitis C   Esophageal varices   Cirrhosis   GIB (gastrointestinal bleeding)   Hemorrhagic shock   Acute renal failure   ATN (acute tubular necrosis)   Acute respiratory failure   Time spent 40 minutes

## 2014-03-05 NOTE — Progress Notes (Signed)
Pt has done well on cpap 5/5 25%.  Suctioned moderate amounts of yellow secretions throughout the day.  Had planned on contacting MD about possible extubation, however RN notified me that he had to restart the neo-synephrine back on and right now his systolic pressure is in the 70's.  Will continue to let him wean throughout the afternoon as patient tolerates, will continue to closely monitor.

## 2014-03-05 NOTE — Progress Notes (Signed)
ANTIBIOTIC CONSULT NOTE  Pharmacy Consult for Levaquin Indication: intra abdominal infection  No Known Allergies  Patient Measurements: Height: 5\' 8"  (172.7 cm) Weight: 174 lb 13.2 oz (79.3 kg) IBW/kg (Calculated) : 68.4 Last recorded weight = 74Kg  Vital Signs: Temp: 97.8 F (36.6 C) (11/11 0822) Temp Source: Axillary (11/11 0822) BP: 83/57 mmHg (11/11 0800) Pulse Rate: 54 (11/11 0800) Intake/Output from previous day: 11/10 0701 - 11/11 0700 In: 2090.4 [I.V.:1008.4; IV Piggyback:582; TPN:500] Out: 5225 [Urine:5225] Intake/Output from this shift:    Labs:  Recent Labs  03/03/14 0424 03/04/14 0530 03/05/14 0357  WBC 5.2 6.3 6.9  HGB 9.6* 10.8* 10.6*  PLT 58* 64* 63*  CREATININE 2.75* 2.22* 1.78*   Estimated Creatinine Clearance: 43.8 mL/min (by C-G formula based on Cr of 1.78). No results for input(s): VANCOTROUGH, VANCOPEAK, VANCORANDOM, GENTTROUGH, GENTPEAK, GENTRANDOM, TOBRATROUGH, TOBRAPEAK, TOBRARND, AMIKACINPEAK, AMIKACINTROU, AMIKACIN in the last 72 hours.   Microbiology: Recent Results (from the past 720 hour(s))  MRSA PCR Screening     Status: None   Collection Time: 03/15/2014  2:15 PM  Result Value Ref Range Status   MRSA by PCR NEGATIVE NEGATIVE Final    Comment:        The GeneXpert MRSA Assay (FDA approved for NASAL specimens only), is one component of a comprehensive MRSA colonization surveillance program. It is not intended to diagnose MRSA infection nor to guide or monitor treatment for MRSA infections.   Culture, blood (routine x 2)     Status: None (Preliminary result)   Collection Time: 03/02/14  9:47 AM  Result Value Ref Range Status   Specimen Description PORTA CATH  Final   Special Requests BOTTLES DRAWN AEROBIC AND ANAEROBIC 6CC  Final   Culture NO GROWTH 1 DAY  Final   Report Status PENDING  Incomplete  Culture, blood (routine x 2)     Status: None (Preliminary result)   Collection Time: 03/02/14  9:53 AM  Result Value Ref Range  Status   Specimen Description RIGHT ANTECUBITAL  Final   Special Requests BOTTLES DRAWN AEROBIC AND ANAEROBIC 10CC  Final   Culture NO GROWTH 1 DAY  Final   Report Status PENDING  Incomplete  Body fluid culture     Status: None (Preliminary result)   Collection Time: 03/03/14  3:28 PM  Result Value Ref Range Status   Specimen Description ABDOMEN  Final   Special Requests NONE  Final   Gram Stain   Final    NO WBC SEEN NO ORGANISMS SEEN Performed at [facility]    Culture   Final    NO GROWTH 1 DAY Performed at [facility]    Report Status PENDING  Incomplete   Medical History: Past Medical History  Diagnosis Date  . Malnutrition of moderate degree   . Altered mental status   . Unspecified essential hypertension   . Anxiety state, unspecified   . Transient ischemic attack (TIA), and cerebral infarction without residual deficits(V12.54)   . Body mass index between 19-24, adult   . Atrial fibrillation     episode at [place].  Marland Kitchen. Heart murmur   . Obstructive sleep apnea (adult) (pediatric)     to start back after surgery- not used for 2-503months  . GERD (gastroesophageal reflux disease)   . Unspecified epilepsy without mention of intractable epilepsy 13    only once  . Hand laceration 80    end of all fingers on rt hand  . Old myocardial  infarction   . Asthma     "outgrew it"  . Borderline diabetes   . 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 Acute alcoholic hepatitis     possible hepatic encephalopathy May 10, 2011  . Daily headache   . Migraine     "daily" (08/30/2013)  . Stroke ~ 2000    "mild"  . Arthritis     "I'm ate up w/it" (08/30/2013)  . Chronic lower back pain   . On home oxygen therapy     "2L prn" (08/30/2013)  . Chronic kidney disease     "one isn't functioning like it should" (08/30/2013)  . Cirrhosis   . Esophageal varices   . Thrombus     chronic,junction of splenic portal vein  . Pulmonary  nodules     chronic an new 2015  . Cataract    Medications:  Scheduled:  . antiseptic oral rinse  7 mL Mouth Rinse QID  . chlorhexidine  15 mL Mouth Rinse BID  . folic acid  1 mg Oral Daily  . furosemide  160 mg Intravenous BID  . hydrocortisone sod succinate (SOLU-CORTEF) inj  100 mg Intravenous Q8H  . insulin aspart  0-9 Units Subcutaneous 4 times per day  . lactulose  20 g Oral BID  . levofloxacin (LEVAQUIN) IV  750 mg Intravenous Q48H  . pantoprazole (PROTONIX) IV  40 mg Intravenous BID  . potassium chloride  10 mEq Intravenous Q1 Hr x 4  . sodium chloride  3 mL Intravenous Q12H   Assessment: 58yo male with h/o alcoholic cirrhosis admitted with abdominal pain.  History of alcoholic/hepatitis C cirrhosis. He will complete a course of IV levaquin which is to complete 11/11 (today).  Recent micro pending.  Levaquin 11/3 >>11/11  Goal of Therapy:  Eradicate infection.  Plan:  Levaquin 750mg  IV q48hrs Plan for 7 days total Rx per GI Monitor labs, renal fxn, and cultures D/C protocol  Lamonte RicherHayes, Eliot Popper R 03/05/2014,8:59 AM

## 2014-03-06 ENCOUNTER — Other Ambulatory Visit (HOSPITAL_COMMUNITY): Payer: Self-pay

## 2014-03-06 ENCOUNTER — Ambulatory Visit (HOSPITAL_COMMUNITY): Payer: Self-pay | Admitting: Oncology

## 2014-03-06 ENCOUNTER — Inpatient Hospital Stay (HOSPITAL_COMMUNITY): Payer: Medicaid Other

## 2014-03-06 LAB — BLOOD GAS, ARTERIAL
Acid-Base Excess: 1.4 mmol/L (ref 0.0–2.0)
BICARBONATE: 24.4 meq/L — AB (ref 20.0–24.0)
DRAWN BY: 213101
FIO2: 25 %
MECHVT: 550 mL
O2 Saturation: 95.7 %
PCO2 ART: 31.3 mmHg — AB (ref 35.0–45.0)
PEEP: 5 cmH2O
RATE: 14 resp/min
TCO2: 21.9 mmol/L (ref 0–100)
pH, Arterial: 7.503 — ABNORMAL HIGH (ref 7.350–7.450)
pO2, Arterial: 75.5 mmHg — ABNORMAL LOW (ref 80.0–100.0)

## 2014-03-06 LAB — COMPREHENSIVE METABOLIC PANEL
ALK PHOS: 57 U/L (ref 39–117)
ALT: 23 U/L (ref 0–53)
AST: 36 U/L (ref 0–37)
Albumin: 2.2 g/dL — ABNORMAL LOW (ref 3.5–5.2)
Anion gap: 13 (ref 5–15)
BUN: 53 mg/dL — ABNORMAL HIGH (ref 6–23)
CALCIUM: 8 mg/dL — AB (ref 8.4–10.5)
CO2: 26 meq/L (ref 19–32)
Chloride: 110 mEq/L (ref 96–112)
Creatinine, Ser: 1.67 mg/dL — ABNORMAL HIGH (ref 0.50–1.35)
GFR calc Af Amer: 51 mL/min — ABNORMAL LOW (ref >90)
GFR calc non Af Amer: 44 mL/min — ABNORMAL LOW (ref >90)
GLUCOSE: 164 mg/dL — AB (ref 70–99)
POTASSIUM: 2.8 meq/L — AB (ref 3.7–5.3)
SODIUM: 149 meq/L — AB (ref 137–147)
TOTAL PROTEIN: 4.9 g/dL — AB (ref 6.0–8.3)
Total Bilirubin: 6.2 mg/dL — ABNORMAL HIGH (ref 0.3–1.2)

## 2014-03-06 LAB — GLUCOSE, CAPILLARY
GLUCOSE-CAPILLARY: 138 mg/dL — AB (ref 70–99)
GLUCOSE-CAPILLARY: 149 mg/dL — AB (ref 70–99)
GLUCOSE-CAPILLARY: 157 mg/dL — AB (ref 70–99)
GLUCOSE-CAPILLARY: 185 mg/dL — AB (ref 70–99)
Glucose-Capillary: 170 mg/dL — ABNORMAL HIGH (ref 70–99)
Glucose-Capillary: 154 mg/dL — ABNORMAL HIGH (ref 70–99)

## 2014-03-06 LAB — PHOSPHORUS: PHOSPHORUS: 2.8 mg/dL (ref 2.3–4.6)

## 2014-03-06 LAB — MAGNESIUM
Magnesium: 1.5 mg/dL (ref 1.5–2.5)
Magnesium: 1.6 mg/dL (ref 1.5–2.5)

## 2014-03-06 LAB — POTASSIUM: Potassium: 3.5 mEq/L — ABNORMAL LOW (ref 3.7–5.3)

## 2014-03-06 MED ORDER — POTASSIUM CL IN DEXTROSE 5% 20 MEQ/L IV SOLN
20.0000 meq | INTRAVENOUS | Status: DC
  Administered 2014-03-06 (×2): 20 meq via INTRAVENOUS

## 2014-03-06 MED ORDER — TRACE MINERALS CR-CU-F-FE-I-MN-MO-SE-ZN IV SOLN
INTRAVENOUS | Status: AC
  Administered 2014-03-06: 18:00:00 via INTRAVENOUS
  Filled 2014-03-06: qty 2000

## 2014-03-06 MED ORDER — SODIUM CHLORIDE 0.9 % IV SOLN: INTRAVENOUS | Status: DC | PRN

## 2014-03-06 MED ORDER — POTASSIUM CHLORIDE 10 MEQ/100ML IV SOLN
10.0000 meq | INTRAVENOUS | Status: AC
  Administered 2014-03-06 (×4): 10 meq via INTRAVENOUS
  Filled 2014-03-06 (×4): qty 100

## 2014-03-06 MED ORDER — FAT EMULSION 20 % IV EMUL
250.0000 mL | INTRAVENOUS | Status: AC
  Administered 2014-03-06: 250 mL via INTRAVENOUS
  Filled 2014-03-06: qty 250

## 2014-03-06 MED ORDER — INSULIN ASPART 100 UNIT/ML ~~LOC~~ SOLN
0.0000 [IU] | SUBCUTANEOUS | Status: DC
  Administered 2014-03-06: 1 [IU] via SUBCUTANEOUS
  Administered 2014-03-06 – 2014-03-07 (×4): 2 [IU] via SUBCUTANEOUS

## 2014-03-06 NOTE — Procedures (Signed)
Extubation Procedure Note  Patient Details:   Name: Darren Abbott DOB: 12/12/1955 MRN: [id number]   Airway Documentation:  AIRWAYS 7.5 mm (Active)  Secured at (cm) 23 cm 02/24/2014 12:00 AM    Evaluation  O2 sats: stable throughout Complications: No apparent complications Patient did tolerate procedure well. Bilateral Breath Sounds: Rhonchi Suctioning: Oral, Airway Yes   Pt extubated to 6L nasal cannula per MD order. Family at bedside and pt tolerating well at this time.  RT will continue to monitor.  Tylene FantasiaHaley, Iza Preston Leigh 03/06/2014, 11:14 AM

## 2014-03-06 NOTE — Progress Notes (Signed)
Subjective:  Patient intubated.   Objective: Vital signs in last 24 hours: Temp:  [97 F (36.1 C)-97.9 F (36.6 C)] 97 F (36.1 C) (11/12 0726) Pulse Rate:  [44-97] 47 (11/12 0900) Resp:  [6-25] 13 (11/12 0900) BP: (77-108)/(45-72) 81/48 mmHg (11/12 0900) SpO2:  [95 %-100 %] 100 % (11/12 0900) Arterial Line BP: (72-121)/(40-71) 94/42 mmHg (11/12 0900) FiO2 (%):  [25 %] 25 % (11/12 0829) Weight:  [171 lb 15.3 oz (78 kg)] 171 lb 15.3 oz (78 kg) (11/12 0500) Last BM Date: Apr 26, 2013 General:    Intubated, NAD Head:  Normocephalic and atraumatic. Eyes:  Sclera clear, no icterus.  Chest: CTA bilaterally without rales, rhonchi, crackles.    Heart:  Bradycardic, regular rhythm; no murmurs, clicks, rubs,  or gallops. Abdomen:  Soft, less distended. Normal bowel sounds.   Extremities:  Trace pedal edema. Neurologic:  intubated. Skin:  Intact without significant lesions or rashes. Psych:  intubated  Intake/Output from previous day: 11/11 0701 - 11/12 0700 In: 320 [I.V.:20; IV Piggyback:300] Out: 9450 [Urine:9450] Intake/Output this shift:    Lab Results: CBC  Recent Labs  03/04/14 0530 03/05/14 0357  WBC 6.3 6.9  HGB 10.8* 10.6*  HCT 31.1* 31.5*  MCV 89.4 90.3  PLT 64* 63*   BMET  Recent Labs  03/04/14 0530 03/05/14 0357 03/06/14 0520  NA 151* 150* 149*  K 2.7* 2.9* 2.8*  CL 114* 111 110  CO2 22 25 26   GLUCOSE 122* 159* 164*  BUN 58* 51* 53*  CREATININE 2.22* 1.78* 1.67*  CALCIUM 7.8* 7.7* 8.0*   LFTs  Recent Labs  03/04/14 0530 03/05/14 0934 03/06/14 0520  BILITOT 8.3* 6.5* 6.2*  BILIDIR  --  4.5*  --   IBILI  --  2.0*  --   ALKPHOS 64 60 57  AST 49* 41* 36  ALT 33 26 23  PROT 5.4* 5.2* 4.9*  ALBUMIN 2.6* 2.2* 2.2*   No results for input(s): LIPASE in the last 72 hours. PT/INR  Recent Labs  03/05/14 0934  LABPROT 20.6*  INR 1.76*      Imaging Studies:   [place] Paracentesis  03/03/2014   CLINICAL DATA:  Cirrhosis, ascites ; in ICA on  ventilator post variceal GI bleed post banding  EXAM: ULTRASOUND GUIDED DIAGNOSTIC AND THERAPEUTIC PARACENTESIS  COMPARISON:  02/21/2014  PROCEDURE: Procedure, benefits, and risks of procedure were discussed with patient.  Written informed consent for procedure was obtained.  Time out protocol followed.  Adequate collection of ascites localized by ultrasound in RIGHT lower quadrant.  Skin prepped and draped in usual sterile fashion.  Skin and soft tissues anesthetized with 10 mL of 1% lidocaine.  5 [place]h catheter placed into peritoneal cavity.  4000 mL of bright yellow fluid aspirated by vacuum bottle suction.  Procedure tolerated well by patient without immediate complication.  Hemostasis obtained by manual compression.  Pressure dressing was placed at conclusion of procedure.  FINDINGS: A total of approximately 4000 mL of ascitic fluid was removed. A fluid sample of 180 mL was sent for laboratory analysis.  IMPRESSION: Successful ultrasound guided paracentesis yielding 4000 mL of ascites.   Electronically Signed   By: Ulyses SouthwardMark  Boles M.D.   On: 03/03/2014 15:56     Assessment: [age] year old male with history of ETOH/HCV cirrhosis, portal vein thrombosis on Xarelto as outpatient, presenting this admission with hematemesis and melena, acute blood loss anemia secondary to large variceal bleed. Procedure at bedside on 11/3 with 5 columns of large varices,  banding X 4. 1 varix with evidence of white nipple sign. Continues to require intubation and pressor support. Remains without any evidence of overt GI bleeding, Hgb stable/improving. Total of 4 units PRBCs this admission. TPN started 11/9. MELD 25 on 11/11.   Acute renal failure: multifactorial. Followed by Nephrology. Slowly improving.   Tense ascites: s/p paracentesis on 11/9. Fluid analysis reviewed. No evidence for SBP with cell count. Fluid culture with no growth X 1 day and cytology pending. Blood cultures without growth thus far.   Hypotension:  arterial line placed on 11/10. Remains hypotensive and now bradycardic. Patient is on stress steroids, Neo-synephrine.   Patient's condition remains guarded; he remains without evidence of recurrent GI bleeding. 4 bands noted in emesis yesterday. MELD 25 yesterday.  Plan: 1. Discussed with Dr. Juanetta GoslingHawkins, family considering pulling back on support. No decisions made as of now.  2. Continue current GI management. Will follow up on labs and cultures/fluid analysis. 3. Continue to follow closely with you.   LOS: 9 days   Tana CoastLeslie Maximiliano Cromartie  03/06/2014, 11:11 AM

## 2014-03-06 NOTE — Progress Notes (Signed)
-  Patient's appointment [facility]  We should see him in the clinic 3 weeks following discharge from hospital.  Inpatient discharge follow-up filled out to reflect this request.  KEFALAS,THOMAS  03/06/2014

## 2014-03-06 NOTE — Progress Notes (Signed)
Pt had multiple episodes of attempting to remove ett and various lines . Mitts are ineffective Pt was aggressive and fighting vent attempting to stand up from bed multiple times Pt was re-sedated and all vitals and vent tolerance returned to previous state all dressings and lines were returned to their original operating posotions

## 2014-03-06 NOTE — Progress Notes (Signed)
PARENTERAL NUTRITION CONSULT NOTE  Pharmacy Consult for TPN Indication: malnutrition  No Known Allergies  Patient Measurements: Height: 5\' 8"  (172.7 cm) Weight: 171 lb 15.3 oz (78 kg) IBW/kg (Calculated) : 68.4 Adjusted Body Weight: 70Kg Usual Weight: 71Kg  Vital Signs: Temp: 96.2 F (35.7 C) (11/12 1215) Temp Source: Axillary (11/12 1215) BP: 81/48 mmHg (11/12 0900) Pulse Rate: 47 (11/12 0900) Intake/Output from previous day: 11/11 0701 - 11/12 0700 In: 320 [I.V.:20; IV Piggyback:300] Out: 9450 [Urine:9450] Intake/Output from this shift:    Labs:  Recent Labs  03/04/14 0530 03/05/14 0357 03/05/14 0934  WBC 6.3 6.9  --   HGB 10.8* 10.6*  --   HCT 31.1* 31.5*  --   PLT 64* 63*  --   INR  --   --  1.76*     Recent Labs  03/04/14 0530 03/05/14 0357 03/05/14 0934 03/06/14 0520  NA 151* 150*  --  149*  K 2.7* 2.9*  --  2.8*  CL 114* 111  --  110  CO2 22 25  --  26  GLUCOSE 122* 159*  --  164*  BUN 58* 51*  --  53*  CREATININE 2.22* 1.78*  --  1.67*  CALCIUM 7.8* 7.7*  --  8.0*  MG 1.4* 1.6  --  1.5  PHOS 3.6  --   --  2.8  PROT 5.4*  --  5.2* 4.9*  ALBUMIN 2.6*  --  2.2* 2.2*  AST 49*  --  41* 36  ALT 33  --  26 23  ALKPHOS 64  --  60 57  BILITOT 8.3*  --  6.5* 6.2*  BILIDIR  --   --  4.5*  --   IBILI  --   --  2.0*  --   PREALBUMIN 4.6*  --   --   --   TRIG 87  --   --   --    Estimated Creatinine Clearance: 46.6 mL/min (by C-G formula based on Cr of 1.67).    Recent Labs  03/06/14 0611 03/06/14 0709 03/06/14 1203  GLUCAP 185* 170* 157*   Medical History: Past Medical History  Diagnosis Date  . Malnutrition of moderate degree   . Altered mental status   . Unspecified essential hypertension   . Anxiety state, unspecified   . Transient ischemic attack (TIA), and cerebral infarction without residual deficits(V12.54)   . Body mass index between 19-24, adult   . Atrial fibrillation     episode at [place].  Marland Kitchen. Heart murmur   .  Obstructive sleep apnea (adult) (pediatric)     to start back after surgery- not used for 2-133months  . GERD (gastroesophageal reflux disease)   . Unspecified epilepsy without mention of intractable epilepsy 13    only once  . Hand laceration 80    end of all fingers on rt hand  . Old myocardial infarction   . Asthma     "outgrew it"  . Borderline diabetes   . 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 Acute alcoholic hepatitis     possible hepatic encephalopathy May 10, 2011  . Daily headache   . Migraine     "daily" (08/30/2013)  . Stroke ~ 2000    "mild"  . Arthritis     "I'm ate up w/it" (08/30/2013)  . Chronic lower back pain   . On home oxygen therapy     "2L prn" (08/30/2013)  .  Chronic kidney disease     "one isn't functioning like it should" (08/30/2013)  . Cirrhosis   . Esophageal varices   . Thrombus     chronic,junction of splenic portal vein  . Pulmonary nodules     chronic an new 2015  . Cataract    Medications:  Scheduled:  . antiseptic oral rinse  7 mL Mouth Rinse QID  . chlorhexidine  15 mL Mouth Rinse BID  . folic acid  1 mg Oral Daily  . hydrocortisone sod succinate (SOLU-CORTEF) inj  100 mg Intravenous Q8H  . insulin aspart  0-9 Units Subcutaneous 4 times per day  . pantoprazole (PROTONIX) IV  40 mg Intravenous BID  . sodium chloride  3 mL Intravenous Q12H   Insulin Requirements in the past 24 hours:  5 units SSI  Current Nutrition:  Initiating TPN  Assessment: 58yo male with h/o cirrhosis and moderate malnutrition.  He was admitted with acute esophageal bleeding which required banding on 03/08/2014.  S/p paracentesis 11/9.  Pt remains intubated on ventilator.   Tolerating TPN.  Labs reviewed- Scr improved, CaPhos product ok.  Potassium remains low & Magnesium level now OK.  Na is elevated.  Glucose slightly elevated.  LFTs OK, TGs normal.  Prealbumin is low. I/O -9130  Estimated Nutritional Needs: Kcal: 1777   Protein: 110 gr  Fluid: 2.2 liters daily (normal needs)  Plan:   Increase Clinimix E 5/15 to 3780ml/hr  Provide MVI, trace elements, and Lipids daily with TPN  Add regular insulin 15 units per bag to TPN  Monitor labs, renal fxn, fluid status, and glucose tolerance  K-runs as needed / ordered by MD   Valrie HartHall, Aalaya Yadao A 03/06/2014,12:53 PM

## 2014-03-06 NOTE — Progress Notes (Signed)
PROGRESS NOTE  Darren Abbott ZOX:[id number] DOB: 01-08-1956 DOA: 03/05/2014 PCP: Darren Radar, MD  Summary: 58 year old man presented 11/3 with hematemesis. Quickly decompensated, was intubated for airway protection, placed on vasopressors for hemorrhagic shock and underwent urgent EGD at the bedside same day of admission as well as transfusion of packed red blood cells and FFP. Presumed etiology of bleeding esophageal varices, treated with octreotide infusion, PPI. Hospitalization has been prolonged by persistent hypotension of unclear etiology. Unable to wean from ventilator while hypotensive.after further discussion with pulmonology and involved family members, family has requested removal of endotracheal tube, full DO NOT RESUSCITATE status, no reintubation. They understand this may be  Terminal.  Assessment/Plan: 1. Acute respiratory failure with hypoxia, intubated 11/3 after admission for airway protection secondary to acute GI bleed, hematemesis. Remains intubated, did poorly with weaning trial today. 2. GIB secondary to esophageal varices,,complicated by outpatient anticoagulation with Xarelto. Status post endoscopy with esophageal banding 11/3, octreotide infusion, transfusion 4 units packed red blood cells, 2 units FFP. If rebleeds, may need TIPS. No bleeding since admission. Remained stable. 3. Acute blood loss anemia secondary to GI bleed. Stable s/p transfusion shortly after admission. Hemoglobin has remained stable. 4. Hemorrhagic shock secondary to GI bleed. Cuff pressures may be artificially low, not correlating with arterial line. According to chart, previous providers have noted the patient tends to run low systolic blood pressure. Echocardiogram revealed normal left ventricular function. Started on stress dose steroids 11/10. Blood pressure overall stable, on low-dose vasopressors which hopefully can be weaned off. 5. Acute renal failure secondary to ATN secondary to  hypotension. Hepatorenal syndrome also considered. Followed by nephrology. Continues to improve. 6. Abdominal pain, possible SBP on admission, fluid can obtained after the fact without evidence of SBP. Completed Levaquin. 7. Decompensated alcoholic, hepatitis C cirrhosis with varices, coagulopathy, Thrombocytopenia, ascites. Status post paracentesis 11/9. Completed course of Levaquin 11/11. MELD 30 and Child Pugh C. 3 month mortality 60-80 percent. 8. History of portal vein thrombosis. Not a candidate for anticoagulation secondary to GI bleed as above. 9. Paroxysmal atrial fibrillation, not a candidate for anticoagulation secondary to above. 10. Nutrition. On TNA. 11. Chronic hypoxic respiratory failure on 2 L as needed according to chart. 12. Moderate malnutrition.   Patient remains critically ill with borderline hypotension, on vasopressors and ongoing respiratory failure with poor weaning today. In addition to these critical issues he has advanced liver disease with s predicted very high mortality in the next 3 months. Case was discussed with Darren Abbott this morning who has discussed with family. I discussed in detail with daughter Darren Abbott and multiple family members including brothers and sisters at the bedside current treatment options which include full ventilatory support and blood pressure support on ongoing curative attempts. Darren Abbott has been acting on the family's behalf. She does not think her father would want continued ventilatory support and requests extubation, no reintubation and full DO NOT RESUSCITATE status. She feels that he is suffering and given his lack of improvement and high 3 month mortality that he would want comfort over curative attempts. She understands that extubation may be terminal but in no circumstances should he be reintubated. If respiratory or clinical status declines she requests full comfort care. I concur with her desires as well as the family's present. All family  members seem to be in agreement and I believe Darren Abbott is acting in the best interest of the patient (there is no formal power of attorney). This discussion was witnessed by Darren Abbott and Darren Mills,  RN. Furthermore Dr. Juanetta GoslingHawkins concurs with this as well.   No NGT secondary to bleeding varices.  Last bowel movement on admission. Trial enema if clinical status improves.  Continue PPI twice a day.assess clinical status, possibly wean TNA seen.  MELD score suggests high mortality next 3 months.   Code Status: DNR/DNI DVT prophylaxis: SCDs Family Communication:  Disposition Plan: pending  Darren Sacksaniel Aden Sek, MD  [facility]  Pager [phone] If 7PM-7AM, please contact night-coverage at [email or web address], password [email or web address] 03/06/2014, 8:13 AM  LOS: 9 days   Consultants:  Gastroenterology  Pulmonology  Nephrology   Procedures:  Transfusion 4 units packed red blood cells  Transfusion 2 units FFP  ETT 11/3 >>  Central line catheter 11/3 >>  11/3 EGD: ENDOSCOPIC IMPRESSION: GI BLEED DUE TO ESOPHAGEAL VARIX IN PT WHO NOW AS A MELD SCORE OF 29 AND IS CHILD PUGH C. immediate complications. PT VOMITED BLOOD DURING PROCEDURE.  Large-volume paracentesis 11/9, 4 L  Echo:- Normal LV wall thickness with LVEF 60-65%, normal diastolic function. Mild left atrial enlargement. Mildly thickened mitral valve with bowing but no frank prolapse, trivial mtiral regugitation. Sclerotic aortic valve, noderately calcified noncoronary cusp. Unable to assess PASP.  Arterial line 11/10 >>   Antibiotics:  Levaquin 11/3 >> 11/11  HPI/Subjective: 2 episodes of vomiting yesterday. Bands were seen, GI has noted. No bleeding. Did well with weaning trial yesterday but not today, placed back on full support. Was started on low-dose vasopressors again. Agitated overnight, required sedation. Nephrology has discontinued Lasix given excellent urine output and started on IV fluids.  Awake, when asked he  indicates by gesture that he wants ETT out and not put back in.  Objective: Filed Vitals:   03/06/14 0500 03/06/14 0600 03/06/14 0726 03/06/14 0734  BP: 90/52     Pulse: 49 45  89  Temp:   97 F (36.1 C)   TempSrc:   Axillary   Resp: 12 13  25   Height:      Weight: 78 kg (171 lb 15.3 oz)     SpO2: 99% 100%  96%    Intake/Output Summary (Last 24 hours) at 03/06/14 0813 Last data filed at 03/06/14 0500  Gross per 24 hour  Intake    220 ml  Output   9450 ml  Net  -9230 ml     Filed Weights   03/04/14 0500 03/05/14 0500 03/06/14 0500  Weight: 85.1 kg (187 lb 9.8 oz) 79.3 kg (174 lb 13.2 oz) 78 kg (171 lb 15.3 oz)    Exam:     Afebrile,systolic blood pressure in the 90s.  General: appears calm, comfortable, ill but not toxic. Appears comfortable on vent.  Eyes appear unremarkable  ENT lips appear unremarkable  Cardiovascular regular rate and rhythm. No murmur, rub or gallop.telemetry sinus rhythm.  Respiratory clear to auscultation bilaterally. No wheezes, rales or rhonchi. Normal respiratory effort.  Abdomen soft nontender nondistended.  Skin appears grossly unremarkable.  Data Reviewed:  Renal function slowly improving. Potassium 2.8. Total bilirubin stable, 6.2.  Scheduled Meds: . antiseptic oral rinse  7 mL Mouth Rinse QID  . chlorhexidine  15 mL Mouth Rinse BID  . folic acid  1 mg Oral Daily  . hydrocortisone sod succinate (SOLU-CORTEF) inj  100 mg Intravenous Q8H  . insulin aspart  0-9 Units Subcutaneous 4 times per day  . lactulose  20 g Oral BID  . pantoprazole (PROTONIX) IV  40 mg Intravenous BID  . potassium chloride  10 mEq Intravenous  Q1 Hr x 4  . sodium chloride  3 mL Intravenous Q12H   Continuous Infusions: . dextrose 5 % with KCl 20 mEq / L    . Marland Kitchen.TPN (CLINIMIX-E) Adult 70 mL/hr at 03/05/14 1828   And  . fat emulsion 250 mL (03/05/14 1828)  . fentaNYL infusion INTRAVENOUS 75 mcg/hr (03/04/14 1337)  . phenylephrine (NEO-SYNEPHRINE) Adult  infusion 50 mcg/min (03/04/14 2319)    Principal Problem:   Upper GI bleed Active Problems:   Abdominal pain   Hepatitis C   Esophageal varices   Cirrhosis   GIB (gastrointestinal bleeding)   Hemorrhagic shock   Acute renal failure   ATN (acute tubular necrosis)   Acute respiratory failure   Chronic hepatitis C without hepatic coma   Time spent 40 minutes greater than 50% counseling and coordination of care

## 2014-03-06 NOTE — Progress Notes (Signed)
Subjective: Interval History: Patient remains intubated.  Objective: Vital signs in last 24 hours: Temp:  [97 F (36.1 C)-97.9 F (36.6 C)] 97 F (36.1 C) (11/12 0726) Pulse Rate:  [45-97] 89 (11/12 0734) Resp:  [6-25] 25 (11/12 0734) BP: (77-108)/(45-67) 90/52 mmHg (11/12 0500) SpO2:  [90 %-100 %] 96 % (11/12 0734) Arterial Line BP: (72-117)/(40-65) 99/46 mmHg (11/12 0600) FiO2 (%):  [25 %] 25 % (11/12 0734) Weight:  [78 kg (171 lb 15.3 oz)] 78 kg (171 lb 15.3 oz) (11/12 0500) Weight change: -1.3 kg (-2 lb 13.9 oz)  Intake/Output from previous day: 11/11 0701 - 11/12 0700 In: 320 [I.V.:20; IV Piggyback:300] Out: 9450 [Urine:9450] Intake/Output this shift:   Generally patient remains intubated. More alert Chest decreased breath sound bilaterally. Heart exam revealed regular rate and rhythm, no murmur Abdomen: Distend and tight on examination,and positive bowl sound Extremities:  trace edema   Lab Results:  Recent Labs  03/04/14 0530 03/05/14 0357  WBC 6.3 6.9  HGB 10.8* 10.6*  HCT 31.1* 31.5*  PLT 64* 63*   BMET:   Recent Labs  03/05/14 0357 03/06/14 0520  NA 150* 149*  K 2.9* 2.8*  CL 111 110  CO2 25 26  GLUCOSE 159* 164*  BUN 51* 53*  CREATININE 1.78* 1.67*  CALCIUM 7.7* 8.0*   No results for input(s): PTH in the last 72 hours. Iron Studies: No results for input(s): IRON, TIBC, TRANSFERRIN, FERRITIN in the last 72 hours.  Studies/Results: No results found.  I have reviewed the patient's current medications.  Assessment/Plan: Problem #1 acute kidney injury: Possibly prerenal versus ATN. His renal function is progressively improving. Presently patient is pollyuric with 9400 cc of out put. Patient on lasix Problem #2 hypotension:  His systolic blood pressure still low,. Presently he is on Levophed Problem #3 history of liver cirrhosis. Patient with ascites and  edema Problem #4 history of hepatitis C Problem #5 a trial fibrillation: His heart rate  is controlled Problem #6 patient remains intubated and does not seem to tolerate weaning off. Problem #7 anemia: s/p blood transfussion his hemoglobin is stable Problem#8 Hypokalemia: Most likely from lasix . Patient on potassium supplement Problem#9 hypernatremia:  Possibly from polyuria and lack of free water Plan:1] We'll d/c lasix 2] we will d/c d5 1/2ns with kcl 2]We will start patient on d5w with 20 meq of potassium at 75 cc/hr    LOS: 9 days   Darren Abbott S 03/06/2014,7:46 AM

## 2014-03-06 NOTE — Progress Notes (Signed)
Pt placed back on full support due to increased periods of apnea and decreased tidal volumes.  RT will continue to monitor.

## 2014-03-06 NOTE — Progress Notes (Signed)
Subjective: He did pretty well yesterday. Was able to come off pressor support and he was off ventilator support for a significant period of time but developed hypotension again and was placed back on Neo-Synephrine. This morning he is still on Neo-Synephrine still on ventilator support.  Objective: Vital signs in last 24 hours: Temp:  [97 F (36.1 C)-97.9 F (36.6 C)] 97 F (36.1 C) (11/12 0726) Pulse Rate:  [45-97] 89 (11/12 0734) Resp:  [6-25] 25 (11/12 0734) BP: (77-108)/(45-67) 90/52 mmHg (11/12 0500) SpO2:  [90 %-100 %] 96 % (11/12 0734) Arterial Line BP: (72-117)/(40-65) 99/46 mmHg (11/12 0600) FiO2 (%):  [25 %] 25 % (11/12 0734) Weight:  [78 kg (171 lb 15.3 oz)] 78 kg (171 lb 15.3 oz) (11/12 0500) Weight change: -1.3 kg (-2 lb 13.9 oz) Last BM Date: 03/24/2014  Intake/Output from previous day: 11/11 0701 - 11/12 0700 In: 320 [I.V.:20; IV Piggyback:300] Out: 9450 [Urine:9450]  PHYSICAL EXAM General appearance: he is intubated and sedated but when he is undergoing wakeup assessment or his sedation is turned off he is responsive Resp: rhonchi bilaterally Cardio: regular rate and rhythm, S1, S2 normal, no murmur, click, rub or gallop GI: protuberant but nontender Extremities: he has third spacing of fluid  Lab Results:  Results for orders placed or performed during the hospital encounter of 02/23/2014 (from the past 48 hour(s))  Cortisol     Status: None   Collection Time: 03/04/14 10:31 AM  Result Value Ref Range   Cortisol, Plasma 16.8 ug/dL    Comment: (NOTE) AM:  4.3 - 22.4 ug/dL PM:  3.1 - 16.7 ug/dL Performed at [facility]   Glucose, capillary     Status: Abnormal   Collection Time: 03/04/14 12:07 PM  Result Value Ref Range   Glucose-Capillary 113 (H) 70 - 99 mg/dL   Comment 1 Notify RN   Glucose, capillary     Status: Abnormal   Collection Time: 03/04/14  5:46 PM  Result Value Ref Range   Glucose-Capillary 141 (H) 70 - 99 mg/dL   Comment 1 Notify  RN   Glucose, capillary     Status: Abnormal   Collection Time: 03/04/14 11:44 PM  Result Value Ref Range   Glucose-Capillary 169 (H) 70 - 99 mg/dL   Comment 1 Notify RN   Basic metabolic panel     Status: Abnormal   Collection Time: 03/05/14  3:57 AM  Result Value Ref Range   Sodium 150 (H) 137 - 147 mEq/L   Potassium 2.9 (LL) 3.7 - 5.3 mEq/L    Comment: CRITICAL RESULT CALLED TO, READ BACK BY AND VERIFIED WITH: WAGONER,R AT 6:00AM ON 03/05/14 BY FESTERMAN,C    Chloride 111 96 - 112 mEq/L   CO2 25 19 - 32 mEq/L   Glucose, Bld 159 (H) 70 - 99 mg/dL   BUN 51 (H) 6 - 23 mg/dL   Creatinine, Ser 1.78 (H) 0.50 - 1.35 mg/dL   Calcium 7.7 (L) 8.4 - 10.5 mg/dL   GFR calc non Af Amer 40 (L) >90 mL/min   GFR calc Af Amer 47 (L) >90 mL/min    Comment: (NOTE) The eGFR has been calculated using the CKD EPI equation. This calculation has not been validated in all clinical situations. eGFR's persistently <90 mL/min signify possible Chronic Kidney Disease.    Anion gap 14 5 - 15  Magnesium     Status: None   Collection Time: 03/05/14  3:57 AM  Result Value Ref Range  Magnesium 1.6 1.5 - 2.5 mg/dL  CBC     Status: Abnormal   Collection Time: 03/05/14  3:57 AM  Result Value Ref Range   WBC 6.9 4.0 - 10.5 K/uL   RBC 3.49 (L) 4.22 - 5.81 MIL/uL   Hemoglobin 10.6 (L) 13.0 - 17.0 g/dL   HCT 31.5 (L) 39.0 - 52.0 %   MCV 90.3 78.0 - 100.0 fL   MCH 30.4 26.0 - 34.0 pg   MCHC 33.7 30.0 - 36.0 g/dL   RDW 18.3 (H) 11.5 - 15.5 %   Platelets 63 (L) 150 - 400 K/uL    Comment: SPECIMEN CHECKED FOR CLOTS CONSISTENT WITH PREVIOUS RESULT   Blood gas, arterial     Status: None   Collection Time: 03/05/14  5:00 AM  Result Value Ref Range   FIO2 0.25 %   Delivery systems VENTILATOR    Mode PRESSURE REGULATED VOLUME CONTROL    VT 550 mL   Rate 14.0 resp/min   Peep/cpap 5.0 cm H20   pH, Arterial 7.407 7.350 - 7.450   pCO2 arterial 37.1 35.0 - 45.0 mmHg   pO2, Arterial 90.6 80.0 - 100.0 mmHg    Bicarbonate 22.9 20.0 - 24.0 mEq/L   TCO2 21.0 0 - 100 mmol/L   Acid-base deficit 1.2 0.0 - 2.0 mmol/L   O2 Saturation 96.8 %   Patient temperature 37.0    Collection site RIGHT RADIAL    Drawn by [phone]    Sample type A-LINE   Glucose, capillary     Status: Abnormal   Collection Time: 03/05/14  6:24 AM  Result Value Ref Range   Glucose-Capillary 172 (H) 70 - 99 mg/dL   Comment 1 Notify RN   Hepatic function panel     Status: Abnormal   Collection Time: 03/05/14  9:34 AM  Result Value Ref Range   Total Protein 5.2 (L) 6.0 - 8.3 g/dL   Albumin 2.2 (L) 3.5 - 5.2 g/dL   AST 41 (H) 0 - 37 U/L   ALT 26 0 - 53 U/L   Alkaline Phosphatase 60 39 - 117 U/L   Total Bilirubin 6.5 (H) 0.3 - 1.2 mg/dL   Bilirubin, Direct 4.5 (H) 0.0 - 0.3 mg/dL   Indirect Bilirubin 2.0 (H) 0.3 - 0.9 mg/dL  Protime-INR     Status: Abnormal   Collection Time: 03/05/14  9:34 AM  Result Value Ref Range   Prothrombin Time 20.6 (H) 11.6 - 15.2 seconds   INR 1.76 (H) 0.00 - 1.49  Glucose, capillary     Status: Abnormal   Collection Time: 03/05/14 11:45 AM  Result Value Ref Range   Glucose-Capillary 159 (H) 70 - 99 mg/dL   Comment 1 Documented in Chart    Comment 2 Notify RN   Glucose, capillary     Status: Abnormal   Collection Time: 03/05/14  5:08 PM  Result Value Ref Range   Glucose-Capillary 147 (H) 70 - 99 mg/dL   Comment 1 Documented in Chart    Comment 2 Notify RN   Glucose, capillary     Status: Abnormal   Collection Time: 03/06/14 12:14 AM  Result Value Ref Range   Glucose-Capillary 138 (H) 70 - 99 mg/dL  Blood gas, arterial     Status: Abnormal   Collection Time: 03/06/14  5:15 AM  Result Value Ref Range   FIO2 25.00 %   Delivery systems VENTILATOR    Mode PRESSURE REGULATED VOLUME CONTROL    VT 550  mL   Rate 14.0 resp/min   Peep/cpap 5.0 cm H20   pH, Arterial 7.503 (H) 7.350 - 7.450   pCO2 arterial 31.3 (L) 35.0 - 45.0 mmHg   pO2, Arterial 75.5 (L) 80.0 - 100.0 mmHg   Bicarbonate 24.4  (H) 20.0 - 24.0 mEq/L   TCO2 21.9 0 - 100 mmol/L   Acid-Base Excess 1.4 0.0 - 2.0 mmol/L   O2 Saturation 95.7 %   Collection site ARTERIAL LINE    Drawn by [id number]    Sample type ARTERIAL    Allens test (pass/fail) NOT INDICATED (A) PASS  Comprehensive metabolic panel     Status: Abnormal   Collection Time: 03/06/14  5:20 AM  Result Value Ref Range   Sodium 149 (H) 137 - 147 mEq/L   Potassium 2.8 (LL) 3.7 - 5.3 mEq/L    Comment: CRITICAL RESULT CALLED TO, READ BACK BY AND VERIFIED WITH: DANIELS,J AT 6:50AM ON 03/06/14 BY FESTERMAN,C    Chloride 110 96 - 112 mEq/L   CO2 26 19 - 32 mEq/L   Glucose, Bld 164 (H) 70 - 99 mg/dL   BUN 53 (H) 6 - 23 mg/dL   Creatinine, Ser 1.67 (H) 0.50 - 1.35 mg/dL   Calcium 8.0 (L) 8.4 - 10.5 mg/dL   Total Protein 4.9 (L) 6.0 - 8.3 g/dL   Albumin 2.2 (L) 3.5 - 5.2 g/dL   AST 36 0 - 37 U/L   ALT 23 0 - 53 U/L   Alkaline Phosphatase 57 39 - 117 U/L   Total Bilirubin 6.2 (H) 0.3 - 1.2 mg/dL   GFR calc non Af Amer 44 (L) >90 mL/min   GFR calc Af Amer 51 (L) >90 mL/min    Comment: (NOTE) The eGFR has been calculated using the CKD EPI equation. This calculation has not been validated in all clinical situations. eGFR's persistently <90 mL/min signify possible Chronic Kidney Disease.    Anion gap 13 5 - 15  Magnesium     Status: None   Collection Time: 03/06/14  5:20 AM  Result Value Ref Range   Magnesium 1.5 1.5 - 2.5 mg/dL  Phosphorus     Status: None   Collection Time: 03/06/14  5:20 AM  Result Value Ref Range   Phosphorus 2.8 2.3 - 4.6 mg/dL  Glucose, capillary     Status: Abnormal   Collection Time: 03/06/14  6:11 AM  Result Value Ref Range   Glucose-Capillary 185 (H) 70 - 99 mg/dL   Comment 1 Notify RN    Comment 2 Documented in Chart   Glucose, capillary     Status: Abnormal   Collection Time: 03/06/14  7:09 AM  Result Value Ref Range   Glucose-Capillary 170 (H) 70 - 99 mg/dL   Comment 1 Documented in Chart    Comment 2 Notify RN      ABGS  Recent Labs  03/06/14 0515  PHART 7.503*  PO2ART 75.5*  TCO2 21.9  HCO3 24.4*   CULTURES Recent Results (from the past 240 hour(s))  MRSA PCR Screening     Status: None   Collection Time: 03/12/2014  2:15 PM  Result Value Ref Range Status   MRSA by PCR NEGATIVE NEGATIVE Final    Comment:        The GeneXpert MRSA Assay (FDA approved for NASAL specimens only), is one component of a comprehensive MRSA colonization surveillance program. It is not intended to diagnose MRSA infection nor to guide or monitor treatment for MRSA infections.  Culture, blood (routine x 2)     Status: None (Preliminary result)   Collection Time: 03/02/14  9:47 AM  Result Value Ref Range Status   Specimen Description PORTA CATH  Final   Special Requests BOTTLES DRAWN AEROBIC AND ANAEROBIC 6CC  Final   Culture NO GROWTH 1 DAY  Final   Report Status PENDING  Incomplete  Culture, blood (routine x 2)     Status: None (Preliminary result)   Collection Time: 03/02/14  9:53 AM  Result Value Ref Range Status   Specimen Description RIGHT ANTECUBITAL  Final   Special Requests BOTTLES DRAWN AEROBIC AND ANAEROBIC 10CC  Final   Culture NO GROWTH 1 DAY  Final   Report Status PENDING  Incomplete  Body fluid culture     Status: None (Preliminary result)   Collection Time: 03/03/14  3:28 PM  Result Value Ref Range Status   Specimen Description ABDOMEN  Final   Special Requests NONE  Final   Gram Stain   Final    NO WBC SEEN NO ORGANISMS SEEN Performed at [facility]    Culture   Final    NO GROWTH 1 DAY Performed at [facility]    Report Status PENDING  Incomplete   Studies/Results: No results found.  Medications:  Prior to Admission:  Prescriptions prior to admission  Medication Sig Dispense Refill Last Dose  . folic acid (FOLVITE) 1 MG tablet Take 1 tablet (1 mg total) by mouth daily. 30 tablet 1 Past Week at Unknown time  . furosemide (LASIX) 20 MG tablet Take 1  tablet (20 mg total) by mouth daily. 30 tablet 3 Past Week at Unknown time  . Lactulose 20 GM/30ML SOLN Take 30 mLs (20 g total) by mouth 2 (two) times daily. 240 mL 3 Past Week at Unknown time  . metoprolol (LOPRESSOR) 50 MG tablet Take 0.5 tablets (25 mg total) by mouth daily. 60 tablet 11 Past Week at Unknown time  . Multiple Vitamins-Minerals (CENTRUM SILVER PO) Take 1 tablet by mouth daily.   Past Week at Unknown time  . ondansetron (ZOFRAN) 4 MG tablet Take 4 mg by mouth every 8 (eight) hours as needed for nausea or vomiting.   Past Week at Unknown time  . oxazepam (SERAX) 10 MG capsule Take 10 mg by mouth 2 (two) times daily.   Past Week at Unknown time  . oxyCODONE (OXY IR/ROXICODONE) 5 MG immediate release tablet Take 1 tablet (5 mg total) by mouth every 4 (four) hours as needed for severe pain. 100 tablet 0 Past Week at Unknown time  . pantoprazole (PROTONIX) 40 MG tablet Take 1 tablet (40 mg total) by mouth 2 (two) times daily before a meal. 30 tablet 1 Past Week at Unknown time  . Rivaroxaban (XARELTO) 15 MG TABS tablet Take 1 tablet (15 mg total) by mouth daily with breakfast. 30 tablet 1 Past Week at Unknown time  . spironolactone (ALDACTONE) 50 MG tablet Take 1 tablet (50 mg total) by mouth daily. 30 tablet 3 Past Week at Unknown time   Scheduled: . antiseptic oral rinse  7 mL Mouth Rinse QID  . chlorhexidine  15 mL Mouth Rinse BID  . folic acid  1 mg Oral Daily  . hydrocortisone sod succinate (SOLU-CORTEF) inj  100 mg Intravenous Q8H  . insulin aspart  0-9 Units Subcutaneous 4 times per day  . lactulose  20 g Oral BID  . pantoprazole (PROTONIX) IV  40 mg Intravenous BID  .  potassium chloride  10 mEq Intravenous Q1 Hr x 4  . sodium chloride  3 mL Intravenous Q12H   Continuous: . dextrose 5 % with KCl 20 mEq / L    . Marland KitchenTPN (CLINIMIX-E) Adult 70 mL/hr at 03/05/14 1828   And  . fat emulsion 250 mL (03/05/14 1828)  . fentaNYL infusion INTRAVENOUS 75 mcg/hr (03/04/14 1337)  .  phenylephrine (NEO-SYNEPHRINE) Adult infusion 50 mcg/min (03/04/14 2319)   EHU:DJSHF/[id number] arterial line **AND** sodium chloride, Place/Maintain arterial line **AND** sodium chloride, Place/Maintain arterial line **AND** sodium chloride, fentaNYL, fentaNYL, fentaNYL, LORazepam, morphine injection, ondansetron (ZOFRAN) IV, sodium chloride  Assesment:he was admitted with upper GI bleeding and required emergency treatment of esophageal varices. He developed hypovolemic shock. He is still requiring pressor support. He has been on the ventilator now for about 9 days. He also developed acute renal failure which appears to be getting better. He has severe cirrhosis from alcohol abuse and chronic hepatitis C. His bilirubin has not changed significantly in several days  Principal Problem:   Upper GI bleed Active Problems:   Abdominal pain   Hepatitis C   Esophageal varices   Cirrhosis   GIB (gastrointestinal bleeding)   Hemorrhagic shock   Acute renal failure   ATN (acute tubular necrosis)   Acute respiratory failure   Chronic hepatitis C without hepatic coma    Plan:I discussed his situation with his daughter who is at bedside. She says that she is concerned that her father is suffering. We discussed options including terminal weaning or taking the endotracheal tube out with no plans for reintubation. I told her that he might not survive this. I also told her that if he has significant distress we can give him medications to make him comfortable. I told him that we can continue with our current course of treatment.she is going to consider options discussed this with family and try to decide. I did share with her doctor Rehman's assessment of a 60-80% mortality over 3 months because of his liver disease.    LOS: 9 days   Sarh Kirschenbaum L 03/06/2014, 8:22 AM

## 2014-03-06 NOTE — Progress Notes (Signed)
Present with family for support. His daughter, sister and others were with him, very tearful and somewhat expressive about his current status. They mainly discussed who he is to each one and his resiliency and past ability to overcome difficulties. They discussed his faith also.  Prayed with them for comfort and strength.

## 2014-03-07 ENCOUNTER — Inpatient Hospital Stay (HOSPITAL_COMMUNITY): Payer: Medicaid Other

## 2014-03-07 DIAGNOSIS — I959 Hypotension, unspecified: Secondary | ICD-10-CM

## 2014-03-07 LAB — BODY FLUID CULTURE
CULTURE: NO GROWTH
GRAM STAIN: NONE SEEN

## 2014-03-07 LAB — GLUCOSE, CAPILLARY
GLUCOSE-CAPILLARY: 170 mg/dL — AB (ref 70–99)
GLUCOSE-CAPILLARY: 170 mg/dL — AB (ref 70–99)
Glucose-Capillary: 140 mg/dL — ABNORMAL HIGH (ref 70–99)
Glucose-Capillary: 165 mg/dL — ABNORMAL HIGH (ref 70–99)
Glucose-Capillary: 166 mg/dL — ABNORMAL HIGH (ref 70–99)
Glucose-Capillary: 171 mg/dL — ABNORMAL HIGH (ref 70–99)

## 2014-03-07 LAB — CBC
HCT: 32.5 % — ABNORMAL LOW (ref 39.0–52.0)
Hemoglobin: 10.9 g/dL — ABNORMAL LOW (ref 13.0–17.0)
MCH: 30.6 pg (ref 26.0–34.0)
MCHC: 33.5 g/dL (ref 30.0–36.0)
MCV: 91.3 fL (ref 78.0–100.0)
PLATELETS: 55 10*3/uL — AB (ref 150–400)
RBC: 3.56 MIL/uL — AB (ref 4.22–5.81)
RDW: 18 % — ABNORMAL HIGH (ref 11.5–15.5)
WBC: 22.8 10*3/uL — AB (ref 4.0–10.5)

## 2014-03-07 LAB — BASIC METABOLIC PANEL
Anion gap: 8 (ref 5–15)
BUN: 52 mg/dL — AB (ref 6–23)
CO2: 28 meq/L (ref 19–32)
Calcium: 8 mg/dL — ABNORMAL LOW (ref 8.4–10.5)
Chloride: 108 mEq/L (ref 96–112)
Creatinine, Ser: 1.4 mg/dL — ABNORMAL HIGH (ref 0.50–1.35)
GFR calc Af Amer: 63 mL/min — ABNORMAL LOW (ref >90)
GFR, EST NON AFRICAN AMERICAN: 54 mL/min — AB (ref >90)
Glucose, Bld: 176 mg/dL — ABNORMAL HIGH (ref 70–99)
Potassium: 3.7 mEq/L (ref 3.7–5.3)
Sodium: 144 mEq/L (ref 137–147)

## 2014-03-07 MED ORDER — INSULIN ASPART 100 UNIT/ML ~~LOC~~ SOLN
0.0000 [IU] | Freq: Three times a day (TID) | SUBCUTANEOUS | Status: DC
  Administered 2014-03-07 – 2014-03-08 (×3): 2 [IU] via SUBCUTANEOUS
  Administered 2014-03-08 (×2): 1 [IU] via SUBCUTANEOUS

## 2014-03-07 MED ORDER — HYDROCORTISONE NA SUCCINATE PF 100 MG IJ SOLR
50.0000 mg | Freq: Three times a day (TID) | INTRAMUSCULAR | Status: DC
Start: 2014-03-07 — End: 2014-03-10
  Administered 2014-03-07 – 2014-03-09 (×7): 50 mg via INTRAVENOUS
  Filled 2014-03-07 (×7): qty 2

## 2014-03-07 MED ORDER — LACTULOSE 10 GM/15ML PO SOLN
20.0000 g | Freq: Two times a day (BID) | ORAL | Status: DC
  Administered 2014-03-07 – 2014-03-09 (×5): 20 g via ORAL
  Filled 2014-03-07 (×5): qty 30

## 2014-03-07 MED ORDER — TRACE MINERALS CR-CU-F-FE-I-MN-MO-SE-ZN IV SOLN
INTRAVENOUS | Status: AC
  Administered 2014-03-07: 18:00:00 via INTRAVENOUS
  Filled 2014-03-07: qty 2000

## 2014-03-07 MED ORDER — PHENYLEPHRINE HCL 10 MG/ML IJ SOLN
INTRAMUSCULAR | Status: AC
  Filled 2014-03-07: qty 4

## 2014-03-07 MED ORDER — POTASSIUM CL IN DEXTROSE 5% 20 MEQ/L IV SOLN: 20.0000 meq | INTRAVENOUS | Status: DC

## 2014-03-07 MED ORDER — FAT EMULSION 20 % IV EMUL
250.0000 mL | INTRAVENOUS | Status: DC
  Administered 2014-03-07: 250 mL via INTRAVENOUS
  Filled 2014-03-07: qty 250

## 2014-03-07 NOTE — Progress Notes (Signed)
PARENTERAL NUTRITION CONSULT NOTE  Pharmacy Consult for TPN Indication: malnutrition  No Known Allergies  Patient Measurements: Height: 5\' 8"  (172.7 cm) Weight: 177 lb 14.6 oz (80.7 kg) IBW/kg (Calculated) : 68.4 Adjusted Body Weight: 70Kg Usual Weight: 71Kg  Vital Signs: Temp: 97.5 F (36.4 C) (11/13 0400) Temp Source: Axillary (11/13 0400) BP: 94/55 mmHg (11/13 0600) Pulse Rate: 68 (11/13 0600) Intake/Output from previous day: 11/12 0701 - 11/13 0700 In: 10055.6 [I.V.:5319.1; TPN:4736.5] Out: 1450 [Urine:1450] Intake/Output from this shift:    Labs:  Recent Labs  03/05/14 0357 03/05/14 0934  WBC 6.9  --   HGB 10.6*  --   HCT 31.5*  --   PLT 63*  --   INR  --  1.76*     Recent Labs  03/05/14 0357 03/05/14 0934 03/06/14 0520 03/06/14 1318 03/07/14 0442  NA 150*  --  149*  --  144  K 2.9*  --  2.8* 3.5* 3.7  CL 111  --  110  --  108  CO2 25  --  26  --  28  GLUCOSE 159*  --  164*  --  176*  BUN 51*  --  53*  --  52*  CREATININE 1.78*  --  1.67*  --  1.40*  CALCIUM 7.7*  --  8.0*  --  8.0*  MG 1.6  --  1.5 1.6  --   PHOS  --   --  2.8  --   --   PROT  --  5.2* 4.9*  --   --   ALBUMIN  --  2.2* 2.2*  --   --   AST  --  41* 36  --   --   ALT  --  26 23  --   --   ALKPHOS  --  60 57  --   --   BILITOT  --  6.5* 6.2*  --   --   BILIDIR  --  4.5*  --   --   --   IBILI  --  2.0*  --   --   --    Estimated Creatinine Clearance: 55.6 mL/min (by C-G formula based on Cr of 1.4).    Recent Labs  03/06/14 2000 03/07/14 03/07/14 0412  GLUCAP 149* 166* 170*   Medical History: Past Medical History  Diagnosis Date  . Malnutrition of moderate degree   . Altered mental status   . Unspecified essential hypertension   . Anxiety state, unspecified   . Transient ischemic attack (TIA), and cerebral infarction without residual deficits(V12.54)   . Body mass index between 19-24, adult   . Atrial fibrillation     episode at [facility].  Marland Kitchen. Heart murmur   .  Obstructive sleep apnea (adult) (pediatric)     to start back after surgery- not used for 2-303months  . GERD (gastroesophageal reflux disease)   . Unspecified epilepsy without mention of intractable epilepsy 13    only once  . Hand laceration 80    end of all fingers on rt hand  . Old myocardial infarction   . Asthma     "outgrew it"  . Borderline diabetes   . 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 Acute alcoholic hepatitis     possible hepatic encephalopathy May 10, 2011  . Daily headache   . Migraine     "daily" (08/30/2013)  . Stroke ~ 2000    "  mild"  . Arthritis     "I'm ate up w/it" (08/30/2013)  . Chronic lower back pain   . On home oxygen therapy     "2L prn" (08/30/2013)  . Chronic kidney disease     "one isn't functioning like it should" (08/30/2013)  . Cirrhosis   . Esophageal varices   . Thrombus     chronic,junction of splenic portal vein  . Pulmonary nodules     chronic an new 2015  . Cataract    Medications:  Scheduled:  . antiseptic oral rinse  7 mL Mouth Rinse QID  . chlorhexidine  15 mL Mouth Rinse BID  . folic acid  1 mg Oral Daily  . hydrocortisone sod succinate (SOLU-CORTEF) inj  100 mg Intravenous Q8H  . insulin aspart  0-9 Units Subcutaneous 6 times per day  . pantoprazole (PROTONIX) IV  40 mg Intravenous BID  . sodium chloride  3 mL Intravenous Q12H   Insulin Requirements in the past 24 hours:  9 units SSI  Current Nutrition:  Initiating TPN  Assessment: 58yo male with h/o cirrhosis and moderate malnutrition.  He was admitted with acute esophageal bleeding which required banding on 03/01/2014.  S/p paracentesis 11/9.  Pt remains intubated on ventilator.   Tolerating TPN.  Labs reviewed- Scr improved, CaPhos product ok.  Sodium, Potassium, & Magnesium levels now wnl.   Glucose slightly elevated.  LFTs OK, TGs normal.  Prealbumin is low. I/O +524.4 over past 48hrs  Estimated Nutritional Needs: Kcal: 1777   Protein: 110 gr  Fluid: 2.2 liters daily (normal needs)  Plan:   Increase Clinimix E 5/15 to 7580ml/hr  Provide MVI, trace elements, and Lipids daily with TPN  Add regular insulin 20 units per bag to TPN  Monitor labs, renal fxn, fluid status, and glucose tolerance  Darren Abbott, Darren Abbott 03/07/2014,8:29 AM

## 2014-03-07 NOTE — Progress Notes (Signed)
Subjective: He was extubated yesterday and has done very well. He is awake alert and talking and in no acute distress. He says he wants something to drink  Objective: Vital signs in last 24 hours: Temp:  [96.2 F (35.7 C)-97.6 F (36.4 C)] 97.5 F (36.4 C) (11/13 0400) Pulse Rate:  [45-95] 68 (11/13 0600) Resp:  [1-18] 9 (11/13 0600) BP: (81-112)/(48-67) 94/55 mmHg (11/13 0600) SpO2:  [96 %-100 %] 99 % (11/13 0600) Arterial Line BP: (94-113)/(42-59) 113/59 mmHg (11/12 1200) Weight:  [80.7 kg (177 lb 14.6 oz)] 80.7 kg (177 lb 14.6 oz) (11/13 0500) Weight change: 2.7 kg (5 lb 15.2 oz) Last BM Date: 03/20/2014  Intake/Output from previous day: 11/12 0701 - 11/13 0700 In: 10055.6 [I.V.:5319.1; TPN:4736.5] Out: 1450 [Urine:1450]  PHYSICAL EXAM General appearance: alert, cooperative and mild distress Resp: rhonchi bilaterally Cardio: regular rate and rhythm, S1, S2 normal, no murmur, click, rub or gallop GI: soft, non-tender; bowel sounds normal; no masses,  no organomegaly Extremities: some third spacing of IV fluids  Lab Results:  Results for orders placed or performed during the hospital encounter of 03/21/2014 (from the past 48 hour(s))  Hepatic function panel     Status: Abnormal   Collection Time: 03/05/14  9:34 AM  Result Value Ref Range   Total Protein 5.2 (L) 6.0 - 8.3 g/dL   Albumin 2.2 (L) 3.5 - 5.2 g/dL   AST 41 (H) 0 - 37 U/L   ALT 26 0 - 53 U/L   Alkaline Phosphatase 60 39 - 117 U/L   Total Bilirubin 6.5 (H) 0.3 - 1.2 mg/dL   Bilirubin, Direct 4.5 (H) 0.0 - 0.3 mg/dL   Indirect Bilirubin 2.0 (H) 0.3 - 0.9 mg/dL  Protime-INR     Status: Abnormal   Collection Time: 03/05/14  9:34 AM  Result Value Ref Range   Prothrombin Time 20.6 (H) 11.6 - 15.2 seconds   INR 1.76 (H) 0.00 - 1.49  Glucose, capillary     Status: Abnormal   Collection Time: 03/05/14 11:45 AM  Result Value Ref Range   Glucose-Capillary 159 (H) 70 - 99 mg/dL   Comment 1 Documented in Chart     Comment 2 Notify RN   Glucose, capillary     Status: Abnormal   Collection Time: 03/05/14  5:08 PM  Result Value Ref Range   Glucose-Capillary 147 (H) 70 - 99 mg/dL   Comment 1 Documented in Chart    Comment 2 Notify RN   Glucose, capillary     Status: Abnormal   Collection Time: 03/06/14 12:14 AM  Result Value Ref Range   Glucose-Capillary 138 (H) 70 - 99 mg/dL  Blood gas, arterial     Status: Abnormal   Collection Time: 03/06/14  5:15 AM  Result Value Ref Range   FIO2 25.00 %   Delivery systems VENTILATOR    Mode PRESSURE REGULATED VOLUME CONTROL    VT 550 mL   Rate 14.0 resp/min   Peep/cpap 5.0 cm H20   pH, Arterial 7.503 (H) 7.350 - 7.450   pCO2 arterial 31.3 (L) 35.0 - 45.0 mmHg   pO2, Arterial 75.5 (L) 80.0 - 100.0 mmHg   Bicarbonate 24.4 (H) 20.0 - 24.0 mEq/L   TCO2 21.9 0 - 100 mmol/L   Acid-Base Excess 1.4 0.0 - 2.0 mmol/L   O2 Saturation 95.7 %   Collection site ARTERIAL LINE    Drawn by [id number]    Sample type ARTERIAL    Allens test (pass/fail)  NOT INDICATED (A) PASS  Comprehensive metabolic panel     Status: Abnormal   Collection Time: 03/06/14  5:20 AM  Result Value Ref Range   Sodium 149 (H) 137 - 147 mEq/L   Potassium 2.8 (LL) 3.7 - 5.3 mEq/L    Comment: CRITICAL RESULT CALLED TO, READ BACK BY AND VERIFIED WITH: DANIELS,J AT 6:50AM ON 03/06/14 BY FESTERMAN,C    Chloride 110 96 - 112 mEq/L   CO2 26 19 - 32 mEq/L   Glucose, Bld 164 (H) 70 - 99 mg/dL   BUN 53 (H) 6 - 23 mg/dL   Creatinine, Ser 1.67 (H) 0.50 - 1.35 mg/dL   Calcium 8.0 (L) 8.4 - 10.5 mg/dL   Total Protein 4.9 (L) 6.0 - 8.3 g/dL   Albumin 2.2 (L) 3.5 - 5.2 g/dL   AST 36 0 - 37 U/L   ALT 23 0 - 53 U/L   Alkaline Phosphatase 57 39 - 117 U/L   Total Bilirubin 6.2 (H) 0.3 - 1.2 mg/dL   GFR calc non Af Amer 44 (L) >90 mL/min   GFR calc Af Amer 51 (L) >90 mL/min    Comment: (NOTE) The eGFR has been calculated using the CKD EPI equation. This calculation has not been validated in all clinical  situations. eGFR's persistently <90 mL/min signify possible Chronic Kidney Disease.    Anion gap 13 5 - 15  Magnesium     Status: None   Collection Time: 03/06/14  5:20 AM  Result Value Ref Range   Magnesium 1.5 1.5 - 2.5 mg/dL  Phosphorus     Status: None   Collection Time: 03/06/14  5:20 AM  Result Value Ref Range   Phosphorus 2.8 2.3 - 4.6 mg/dL  Glucose, capillary     Status: Abnormal   Collection Time: 03/06/14  6:11 AM  Result Value Ref Range   Glucose-Capillary 185 (H) 70 - 99 mg/dL   Comment 1 Notify RN    Comment 2 Documented in Chart   Glucose, capillary     Status: Abnormal   Collection Time: 03/06/14  7:09 AM  Result Value Ref Range   Glucose-Capillary 170 (H) 70 - 99 mg/dL   Comment 1 Documented in Chart    Comment 2 Notify RN   Glucose, capillary     Status: Abnormal   Collection Time: 03/06/14 12:03 PM  Result Value Ref Range   Glucose-Capillary 157 (H) 70 - 99 mg/dL   Comment 1 Documented in Chart    Comment 2 Notify RN   Potassium     Status: Abnormal   Collection Time: 03/06/14  1:18 PM  Result Value Ref Range   Potassium 3.5 (L) 3.7 - 5.3 mEq/L    Comment: DELTA CHECK NOTED  Magnesium     Status: None   Collection Time: 03/06/14  1:18 PM  Result Value Ref Range   Magnesium 1.6 1.5 - 2.5 mg/dL  Glucose, capillary     Status: Abnormal   Collection Time: 03/06/14  4:19 PM  Result Value Ref Range   Glucose-Capillary 154 (H) 70 - 99 mg/dL  Glucose, capillary     Status: Abnormal   Collection Time: 03/06/14  8:00 PM  Result Value Ref Range   Glucose-Capillary 149 (H) 70 - 99 mg/dL  Glucose, capillary     Status: Abnormal   Collection Time: 03/07/14 12:00 AM  Result Value Ref Range   Glucose-Capillary 166 (H) 70 - 99 mg/dL  Glucose, capillary  Status: Abnormal   Collection Time: 03/07/14  4:12 AM  Result Value Ref Range   Glucose-Capillary 170 (H) 70 - 99 mg/dL  Basic metabolic panel     Status: Abnormal   Collection Time: 03/07/14  4:42 AM   Result Value Ref Range   Sodium 144 137 - 147 mEq/L   Potassium 3.7 3.7 - 5.3 mEq/L   Chloride 108 96 - 112 mEq/L   CO2 28 19 - 32 mEq/L   Glucose, Bld 176 (H) 70 - 99 mg/dL   BUN 52 (H) 6 - 23 mg/dL   Creatinine, Ser 1.40 (H) 0.50 - 1.35 mg/dL   Calcium 8.0 (L) 8.4 - 10.5 mg/dL   GFR calc non Af Amer 54 (L) >90 mL/min   GFR calc Af Amer 63 (L) >90 mL/min    Comment: (NOTE) The eGFR has been calculated using the CKD EPI equation. This calculation has not been validated in all clinical situations. eGFR's persistently <90 mL/min signify possible Chronic Kidney Disease.    Anion gap 8 5 - 15    ABGS  Recent Labs  03/06/14 0515  PHART 7.503*  PO2ART 75.5*  TCO2 21.9  HCO3 24.4*   CULTURES Recent Results (from the past 240 hour(s))  MRSA PCR Screening     Status: None   Collection Time: 03/05/2014  2:15 PM  Result Value Ref Range Status   MRSA by PCR NEGATIVE NEGATIVE Final    Comment:        The GeneXpert MRSA Assay (FDA approved for NASAL specimens only), is one component of a comprehensive MRSA colonization surveillance program. It is not intended to diagnose MRSA infection nor to guide or monitor treatment for MRSA infections.   Culture, blood (routine x 2)     Status: None (Preliminary result)   Collection Time: 03/02/14  9:47 AM  Result Value Ref Range Status   Specimen Description PORTA CATH  Final   Special Requests BOTTLES DRAWN AEROBIC AND ANAEROBIC 6CC  Final   Culture NO GROWTH 4 DAYS  Final   Report Status PENDING  Incomplete  Culture, blood (routine x 2)     Status: None (Preliminary result)   Collection Time: 03/02/14  9:53 AM  Result Value Ref Range Status   Specimen Description RIGHT ANTECUBITAL  Final   Special Requests BOTTLES DRAWN AEROBIC AND ANAEROBIC 10CC  Final   Culture NO GROWTH 4 DAYS  Final   Report Status PENDING  Incomplete  Body fluid culture     Status: None (Preliminary result)   Collection Time: 03/03/14  3:28 PM  Result  Value Ref Range Status   Specimen Description ABDOMEN  Final   Special Requests NONE  Final   Gram Stain   Final    NO WBC SEEN NO ORGANISMS SEEN Performed at [facility]    Culture   Final    NO GROWTH 2 DAYS Performed at [facility]    Report Status PENDING  Incomplete   Studies/Results: Dg Chest Port 1 View  03/07/2014   CLINICAL DATA:  Acute respiratory failure .  EXAM: PORTABLE CHEST - 1 VIEW  COMPARISON:  03/06/2014.  FINDINGS: Left IJ line stable position. Mediastinum and hilar structures are normal. Stable cardiomegaly pulmonary vascularity. Low lung volumes with persistent bibasilar atelectasis. No pleural effusion or pneumothorax. Plate and screw fixation left clavicle.  IMPRESSION: 1. Left IJ line in stable position. 2. Low lung volumes with persistent bibasilar atelectasis.   Electronically Signed  By: Beulah   On: 03/07/2014 07:47   Dg Chest Port 1 View  03/06/2014   CLINICAL DATA:  Respiratory failure  EXAM: PORTABLE CHEST - 1 VIEW  COMPARISON:  03/02/2014  FINDINGS: Cardiomediastinal silhouette is stable. No acute infiltrate or pleural effusion. No pulmonary edema. Persistent bilateral basilar atelectasis or scarring. Left IJ central line is unchanged in position. Endotracheal tube has been removed. Again noted metallic fixation plate distal left clavicle.  IMPRESSION: No active disease. Persistent bilateral basilar atelectasis or scarring. Left IJ central line is unchanged in position.   Electronically Signed   By: Lahoma Crocker M.D.   On: 03/06/2014 14:15    Medications:  Prior to Admission:  Prescriptions prior to admission  Medication Sig Dispense Refill Last Dose  . folic acid (FOLVITE) 1 MG tablet Take 1 tablet (1 mg total) by mouth daily. 30 tablet 1 Past Week at Unknown time  . furosemide (LASIX) 20 MG tablet Take 1 tablet (20 mg total) by mouth daily. 30 tablet 3 Past Week at Unknown time  . Lactulose 20 GM/30ML SOLN Take 30 mLs (20 g  total) by mouth 2 (two) times daily. 240 mL 3 Past Week at Unknown time  . metoprolol (LOPRESSOR) 50 MG tablet Take 0.5 tablets (25 mg total) by mouth daily. 60 tablet 11 Past Week at Unknown time  . Multiple Vitamins-Minerals (CENTRUM SILVER PO) Take 1 tablet by mouth daily.   Past Week at Unknown time  . ondansetron (ZOFRAN) 4 MG tablet Take 4 mg by mouth every 8 (eight) hours as needed for nausea or vomiting.   Past Week at Unknown time  . oxazepam (SERAX) 10 MG capsule Take 10 mg by mouth 2 (two) times daily.   Past Week at Unknown time  . oxyCODONE (OXY IR/ROXICODONE) 5 MG immediate release tablet Take 1 tablet (5 mg total) by mouth every 4 (four) hours as needed for severe pain. 100 tablet 0 Past Week at Unknown time  . pantoprazole (PROTONIX) 40 MG tablet Take 1 tablet (40 mg total) by mouth 2 (two) times daily before a meal. 30 tablet 1 Past Week at Unknown time  . Rivaroxaban (XARELTO) 15 MG TABS tablet Take 1 tablet (15 mg total) by mouth daily with breakfast. 30 tablet 1 Past Week at Unknown time  . spironolactone (ALDACTONE) 50 MG tablet Take 1 tablet (50 mg total) by mouth daily. 30 tablet 3 Past Week at Unknown time   Scheduled: . antiseptic oral rinse  7 mL Mouth Rinse QID  . chlorhexidine  15 mL Mouth Rinse BID  . folic acid  1 mg Oral Daily  . hydrocortisone sod succinate (SOLU-CORTEF) inj  100 mg Intravenous Q8H  . insulin aspart  0-9 Units Subcutaneous 6 times per day  . pantoprazole (PROTONIX) IV  40 mg Intravenous BID  . sodium chloride  3 mL Intravenous Q12H   Continuous: . dextrose 5 % with KCl 20 mEq / L    . Marland KitchenTPN (CLINIMIX-E) Adult 80 mL/hr at 03/07/14 0600   And  . fat emulsion 250 mL (03/07/14 0600)  . Marland KitchenTPN (CLINIMIX-E) Adult     And  . fat emulsion    . phenylephrine (NEO-SYNEPHRINE) Adult infusion 45.333 mcg/min (03/07/14 0600)   GMW:NUUVO/ZDGUYQIH arterial line **AND** sodium chloride, Place/Maintain arterial line **AND** sodium chloride, Place/Maintain  arterial line **AND** sodium chloride, LORazepam, morphine injection, ondansetron (ZOFRAN) IV, sodium chloride  Assesment:he was admitted with an upper GI bleed that required emergency treatment of  esophageal varices. He was intubated for airway protection and developed hypovolemic shock. He had acute renal failure and we've had difficulty with his blood pressure. After discussion with family yesterday we extubated him with no intent to reintubate and he has done surprisingly well.  Principal Problem:   Upper GI bleed Active Problems:   Abdominal pain   Hepatitis C   Esophageal varices   Cirrhosis   GIB (gastrointestinal bleeding)   Hemorrhagic shock   Acute renal failure   ATN (acute tubular necrosis)   Acute respiratory failure   Chronic hepatitis C without hepatic coma    Plan:continue current treatments. Ask the GI team if he can start orals. I will be out of town for the next 2 days.    LOS: 10 days   Darren Abbott L 03/07/2014, 8:41 AM

## 2014-03-07 NOTE — Progress Notes (Signed)
Nutrition Follow-up  INTERVENTION:  TPN per pharmacy   Diet advanced to full liquids (0%) intake currently  NUTRITION DIAGNOSIS: Inadequate oral intake related to inability to eat; pt nutrition requirements met via TPN at this time. Diet is advancing.  Goal: Pt to meet >/= 90% of their estimated nutrition needs; not met   Monitor: Nutrition support and transition to oral intake, labs, I/O's and wt changes    58 y.o. male  Admitting Dx: Upper GI bleed  ASSESSMENT:  Hx of cirrhosis and moderate malnutrition. Presents with acute esophageal GI bleeding, (s/p banding 02/23/2014) acute blood loss anemia (received 4 units of blood this admission), hemorrhagic shock.   Pt extubated and diet advanced to full liquids. S/p paracentesis.  Pt estimated nutrition needs reassessed with change in status (extubation).  Patient is receiving TPN with Clinimix E 5/15 @ 80 ml/hr next bag and lipids @ 10 ml/hr. Provides 2160 ml, 1843 kcal, and 96 grams protein per day.   Meets 100% minimum estimated energy needs and 87% minimum estimated protein needs.   Height: Ht Readings from Last 1 Encounters:  02/26/14 5' 8"  (1.727 m)    Weight: Wt Readings from Last 1 Encounters:  03/07/14 177 lb 14.6 oz (80.7 kg)  03/17/2014-admit wt 66.6 kg   Ideal Body Weight: 154# (70 gr)   Wt Readings from Last 10 Encounters:  03/07/14 177 lb 14.6 oz (80.7 kg)  02/05/14 155 lb 3.2 oz (70.398 kg)  01/23/14 162 lb 6.4 oz (73.664 kg)  01/20/14 159 lb 12.8 oz (72.485 kg)  01/06/14 158 lb (71.668 kg)  01/01/14 150 lb (68.04 kg)  11/26/13 168 lb (76.204 kg)  10/24/13 152 lb (68.947 kg)  09/09/13 156 lb 12.8 oz (71.124 kg)  08/30/13 164 lb 14.5 oz (74.8 kg)    Usual Body Weight: 152-164#   BMI:  Body mass index is 27.06 kg/(m^2). normal range  Estimated Nutritional Needs: Kcal: 1700-1900 Protein: 110 gr Fluid: 2.2 liters daily (normal  needs)  Skin: intact  Diet Order: TPN (CLINIMIX-E) Adult TPN (CLINIMIX-E) Adult Diet full liquid  EDUCATION NEEDS: -No education needs identified at this time   Intake/Output Summary (Last 24 hours) at 03/07/14 1249 Last data filed at 03/07/14 1244  Gross per 24 hour  Intake 10055.57 ml  Output   1450 ml  Net 8605.57 ml    Last BM: 11/3 ??????  Labs:   Recent Labs Lab 03/04/14 0530 03/05/14 0357 03/06/14 0520 03/06/14 1318 03/07/14 0442  NA 151* 150* 149*  --  144  K 2.7* 2.9* 2.8* 3.5* 3.7  CL 114* 111 110  --  108  CO2 22 25 26   --  28  BUN 58* 51* 53*  --  52*  CREATININE 2.22* 1.78* 1.67*  --  1.40*  CALCIUM 7.8* 7.7* 8.0*  --  8.0*  MG 1.4* 1.6 1.5 1.6  --   PHOS 3.6  --  2.8  --   --   GLUCOSE 122* 159* 164*  --  176*    CBG (last 3)   Recent Labs  03/07/14 0412 03/07/14 0842 03/07/14 1130  GLUCAP 170* 170* 165*    Scheduled Meds: . antiseptic oral rinse  7 mL Mouth Rinse QID  . chlorhexidine  15 mL Mouth Rinse BID  . folic acid  1 mg Oral Daily  . hydrocortisone sod succinate (SOLU-CORTEF) inj  50 mg Intravenous Q8H  . insulin aspart  0-9 Units Subcutaneous TID WC  . lactulose  20 g  Oral BID  . pantoprazole (PROTONIX) IV  40 mg Intravenous BID  . sodium chloride  3 mL Intravenous Q12H    Continuous Infusions: . dextrose 5 % with KCl 20 mEq / L 20 mEq (03/07/14 0850)  . Marland KitchenTPN (CLINIMIX-E) Adult 80 mL/hr at 03/07/14 0600   And  . fat emulsion 250 mL (03/07/14 0600)  . Marland KitchenTPN (CLINIMIX-E) Adult     And  . fat emulsion    . phenylephrine (NEO-SYNEPHRINE) Adult infusion 40 mcg/min (03/07/14 1124)    Past Medical History  Diagnosis Date  . Malnutrition of moderate degree   . Altered mental status   . Unspecified essential hypertension   . Anxiety state, unspecified   . Transient ischemic attack (TIA), and cerebral infarction without residual deficits(V12.54)   . Body mass index between 19-24, adult   . Atrial fibrillation     episode  at [place].  Marland Kitchen Heart murmur   . Obstructive sleep apnea (adult) (pediatric)     to start back after surgery- not used for 2-46month  . GERD (gastroesophageal reflux disease)   . Unspecified epilepsy without mention of intractable epilepsy 13    only once  . Hand laceration 80    end of all fingers on rt hand  . Old myocardial infarction   . Asthma     "outgrew it"  . Borderline diabetes   . 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 Acute alcoholic hepatitis     possible hepatic encephalopathy May 10, 2011  . Daily headache   . Migraine     "daily" (08/30/2013)  . Stroke ~ 2000    "mild"  . Arthritis     "I'm ate up w/it" (08/30/2013)  . Chronic lower back pain   . On home oxygen therapy     "2L prn" (08/30/2013)  . Chronic kidney disease     "one isn't functioning like it should" (08/30/2013)  . Cirrhosis   . Esophageal varices   . Thrombus     chronic,junction of splenic portal vein  . Pulmonary nodules     chronic an new 2015  . Cataract     Past Surgical History  Procedure Laterality Date  . Clavicle surgery Left ~ 2008    "crushed it"  . Hand surgery Left     lacerations of thumb. reattached  . Nasal septum surgery  08/30/2013  . Rhinoplasty  08/30/2013  . Hand surgery Right 1980's    "all 4 fingers cut off and reattached"  . Foot fracture surgery Right   . Septoplasty N/A 08/30/2013    Procedure: SEPTOPLASTY;  Surgeon: JIzora Gala MD;  [facility]  Service: ENT;  Laterality: N/A;  . Rhinoplasty N/A 08/30/2013    Procedure: RHINOPLASTY;  Surgeon: JIzora Gala MD;  [facility];  Service: ENT;  Laterality: N/A;  . Esophagogastroduodenoscopy  Dec 2014    Dr. PEarley Brooke Grade 2-3 esophageal varices s/p banding X 5. Erosive gastritis. Negative H.pylori  . Paracentesis      multiple  . Esophagogastroduodenoscopy N/A 03/14/2014    Procedure: ESOPHAGOGASTRODUODENOSCOPY (EGD);  Surgeon: SDanie Binder MD;  [facility];   Service: Endoscopy;  Laterality: N/A;    LColman CaterMS,RD,CSG,LDN Office: #[phone]: #[phone]

## 2014-03-07 NOTE — Progress Notes (Signed)
Subjective: Interval History: Patient denies any difficulty in breathing but complains of dry mouth  Objective: Vital signs in last 24 hours: Temp:  [96.2 F (35.7 C)-97.6 F (36.4 C)] 97.5 F (36.4 C) (11/13 0400) Pulse Rate:  [44-95] 68 (11/13 0600) Resp:  [1-18] 9 (11/13 0600) BP: (81-112)/(48-67) 94/55 mmHg (11/13 0600) SpO2:  [96 %-100 %] 99 % (11/13 0600) Arterial Line BP: (94-113)/(42-59) 113/59 mmHg (11/12 1200) FiO2 (%):  [25 %] 25 % (11/12 0829) Weight:  [80.7 kg (177 lb 14.6 oz)] 80.7 kg (177 lb 14.6 oz) (11/13 0500) Weight change: 2.7 kg (5 lb 15.2 oz)  Intake/Output from previous day: 11/12 0701 - 11/13 0700 In: 10055.6 [I.V.:5319.1; TPN:4736.5] Out: 1450 [Urine:1450] Intake/Output this shift:   Patient is extubated and presently he is alert an in no apparent distress Chest : inspiratory rhonchi Heart exam revealed regular rate and rhythm, no murmur Abdomen: full and none tender,positive bowl sound Extremities:  trace edema   Lab Results:  Recent Labs  03/05/14 0357  WBC 6.9  HGB 10.6*  HCT 31.5*  PLT 63*   BMET:   Recent Labs  03/06/14 0520 03/06/14 1318 03/07/14 0442  NA 149*  --  144  K 2.8* 3.5* 3.7  CL 110  --  108  CO2 26  --  28  GLUCOSE 164*  --  176*  BUN 53*  --  52*  CREATININE 1.67*  --  1.40*  CALCIUM 8.0*  --  8.0*   No results for input(s): PTH in the last 72 hours. Iron Studies: No results for input(s): IRON, TIBC, TRANSFERRIN, FERRITIN in the last 72 hours.  Studies/Results: Dg Chest Port 1 View  03/07/2014   CLINICAL DATA:  Acute respiratory failure .  EXAM: PORTABLE CHEST - 1 VIEW  COMPARISON:  03/06/2014.  FINDINGS: Left IJ line stable position. Mediastinum and hilar structures are normal. Stable cardiomegaly pulmonary vascularity. Low lung volumes with persistent bibasilar atelectasis. No pleural effusion or pneumothorax. Plate and screw fixation left clavicle.  IMPRESSION: 1. Left IJ line in stable position. 2. Low lung  volumes with persistent bibasilar atelectasis.   Electronically Signed   By: Maisie Fushomas  Register   On: 03/07/2014 07:47   Dg Chest Port 1 View  03/06/2014   CLINICAL DATA:  Respiratory failure  EXAM: PORTABLE CHEST - 1 VIEW  COMPARISON:  03/02/2014  FINDINGS: Cardiomediastinal silhouette is stable. No acute infiltrate or pleural effusion. No pulmonary edema. Persistent bilateral basilar atelectasis or scarring. Left IJ central line is unchanged in position. Endotracheal tube has been removed. Again noted metallic fixation plate distal left clavicle.  IMPRESSION: No active disease. Persistent bilateral basilar atelectasis or scarring. Left IJ central line is unchanged in position.   Electronically Signed   By: Natasha MeadLiviu  Pop M.D.   On: 03/06/2014 14:15    I have reviewed the patient's current medications.  Assessment/Plan: Problem #1 acute kidney injury: Possibly prerenal versus ATN. His renal function continue to improve. Patient none oligric Problem #2 hypotension:  His blood pressure is stable Problem #3 history of liver cirrhosis. Problem #4 history of hepatitis C Problem #5 a trial fibrillation: His heart rate is controlled Problem #6 anemia: s/p blood transfussion his hemoglobin is stable Problem#7 Hypokalemia: Patient is on potassium supplement and his potassium has corrected Problem#8 hypernatremia:  Sodium has normalized Plan:1] Decrease his ivf to 50 cc/hr Basic metabolic panel in am    LOS: 10 days   Livi Mcgann S 03/07/2014,7:56 AM

## 2014-03-07 NOTE — Progress Notes (Signed)
Subjective:  Patient has been extubated. He is alert. Wants to eat. No other complaints.   Objective: Vital signs in last 24 hours: Temp:  [96.2 F (35.7 C)-97.6 F (36.4 C)] 97.5 F (36.4 C) (11/13 0400) Pulse Rate:  [45-95] 68 (11/13 0600) Resp:  [1-18] 9 (11/13 0600) BP: (81-112)/(48-67) 94/55 mmHg (11/13 0600) SpO2:  [96 %-100 %] 99 % (11/13 0600) Arterial Line BP: (94-113)/(42-59) 113/59 mmHg (11/12 1200) FiO2 (%):  [25 %] 25 % (11/12 0829) Weight:  [177 lb 14.6 oz (80.7 kg)] 177 lb 14.6 oz (80.7 kg) (11/13 0500) Last BM Date: 03/04/2014 General:   Alert, but groggy. Cooperative in NAD Head:  Normocephalic and atraumatic. Eyes:  Sclera clear, + icterus.  Abdomen:  Soft, nontender and slightly distended. Normal bowel sounds, without guarding, and without rebound.   Extremities:  Without clubbing, deformity. Trace pedal edema. Neurologic:  Alert and  oriented x4;  grossly normal neurologically. No asterixis Skin:  Intact without significant lesions or rashes. Psych:  Alert and cooperative. Normal mood and affect.  Intake/Output from previous day: 11/12 0701 - 11/13 0700 In: 10055.6 [I.V.:5319.1; TPN:4736.5] Out: 1450 [Urine:1450] Intake/Output this shift:    Lab Results: CBC  Recent Labs  03/05/14 0357 03/07/14 0849  WBC 6.9 22.8*  HGB 10.6* 10.9*  HCT 31.5* 32.5*  MCV 90.3 91.3  PLT 63* 55*   BMET  Recent Labs  03/05/14 0357 03/06/14 0520 03/06/14 1318 03/07/14 0442  NA 150* 149*  --  144  K 2.9* 2.8* 3.5* 3.7  CL 111 110  --  108  CO2 25 26  --  28  GLUCOSE 159* 164*  --  176*  BUN 51* 53*  --  52*  CREATININE 1.78* 1.67*  --  1.40*  CALCIUM 7.7* 8.0*  --  8.0*   LFTs  Recent Labs  03/05/14 0934 03/06/14 0520  BILITOT 6.5* 6.2*  BILIDIR 4.5*  --   IBILI 2.0*  --   ALKPHOS 60 57  AST 41* 36  ALT 26 23  PROT 5.2* 4.9*  ALBUMIN 2.2* 2.2*   No results for input(s): LIPASE in the last 72 hours. PT/INR  Recent Labs  03/05/14 0934   LABPROT 20.6*  INR 1.76*      Imaging Studies: [place] Paracentesis  03/03/2014   CLINICAL DATA:  Cirrhosis, ascites ; in ICA on ventilator post variceal GI bleed post banding  EXAM: ULTRASOUND GUIDED DIAGNOSTIC AND THERAPEUTIC PARACENTESIS  COMPARISON:  02/21/2014  PROCEDURE: Procedure, benefits, and risks of procedure were discussed with patient.  Written informed consent for procedure was obtained.  Time out protocol followed.  Adequate collection of ascites localized by ultrasound in RIGHT lower quadrant.  Skin prepped and draped in usual sterile fashion.  Skin and soft tissues anesthetized with 10 mL of 1% lidocaine.  5 [place]h catheter placed into peritoneal cavity.  4000 mL of bright yellow fluid aspirated by vacuum bottle suction.  Procedure tolerated well by patient without immediate complication.  Hemostasis obtained by manual compression.  Pressure dressing was placed at conclusion of procedure.  FINDINGS: A total of approximately 4000 mL of ascitic fluid was removed. A fluid sample of 180 mL was sent for laboratory analysis.  IMPRESSION: Successful ultrasound guided paracentesis yielding 4000 mL of ascites.   Electronically Signed   By: Ulyses SouthwardMark  Boles M.D.   On: 03/03/2014 15:56     Dg Chest Port 1 View  03/07/2014   CLINICAL DATA:  Acute respiratory failure .  EXAM: PORTABLE CHEST - 1 VIEW  COMPARISON:  03/06/2014.  FINDINGS: Left IJ line stable position. Mediastinum and hilar structures are normal. Stable cardiomegaly pulmonary vascularity. Low lung volumes with persistent bibasilar atelectasis. No pleural effusion or pneumothorax. Plate and screw fixation left clavicle.  IMPRESSION: 1. Left IJ line in stable position. 2. Low lung volumes with persistent bibasilar atelectasis.   Electronically Signed   By: Maisie Fushomas  Register  On: 03/07/2014 07:47      Assessment: 58 year old male with history of ETOH/HCV cirrhosis, portal vein thrombosis on Xarelto as outpatient, presenting this admission  with hematemesis and melena, acute blood loss anemia secondary to large variceal bleed. Procedure at bedside on 11/3 with 5 columns of large varices, banding X 4. 1 varix with evidence of white nipple sign.   Patient has been extubated (03/06/14). Continues to require pressor support. Remains without any evidence of overt GI bleeding. Hgb stable. Total of 4 units PRBCs this admission. TPN started 11/9. MELD 25 on 11/11.   Acute renal failure: multifactorial. Followed by Nephrology. Slowly improving.   Tense ascites: s/p paracentesis on 11/9. Fluid analysis reviewed. No evidence for SBP with cell count. Fluid culture with no growth X 2 days and cytology benign. Blood cultures without growth thus far.   Hypotension: arterial line placed on 11/10. Patient is on stress steroids, Neo-synephrine.   Patient's condition remains guarded; he remains without evidence of recurrent GI bleeding. 4 bands noted in emesis yesterday.    New leukocytosis, afebrile. ?related to stress steroids.    Plan: Discussed with Dr. Jena Gaussourk. 1. Management of leukocytosis per attending. 2. Trial of full liquid diet WHEN ALERT. 3. Resume lactulose 30cc po bid.  There will be no GI coverage starting at 5pm today until Monday at 730am.   LOS: 10 days   Tana CoastLeslie Malachai Schalk  03/07/2014, 8:08 AM

## 2014-03-07 NOTE — Progress Notes (Addendum)
PROGRESS NOTE  Darren DonHerbert E Abbott ZOX:[id number] DOB: 06/30/1955 DOA: 02/24/2014 PCP: Augustine RadarOBERSON, KRISTINA, MD  Summary: 58 year old man presented 11/3 with hematemesis. Quickly decompensated, was intubated for airway protection, placed on vasopressors for hemorrhagic shock and underwent urgent EGD at the bedside same day of admission as well as transfusion of packed red blood cells and FFP. Presumed etiology of bleeding esophageal varices, treated with octreotide infusion, PPI. Hospitalization has been prolonged by persistent hypotension of unclear etiology. Unable to wean from ventilator while hypotensive.after further discussion with pulmonology and involved family members, family has requested removal of endotracheal tube, full DO NOT RESUSCITATE status, no reintubation. They understand this may be  Terminal.  Assessment/Plan: 1. Acute respiratory failure with hypoxia, intubated 11/3 after admission for airway protection secondary to acute GI bleed, hematemesis. Successfully extubated 11/12. 2. GIB secondary to esophageal varices, complicated by outpatient anticoagulation with Xarelto. Status post endoscopy with esophageal banding 11/3, octreotide infusion, transfusion 4 units packed red blood cells, 2 units FFP. If rebleeds, may need TIPS. No bleeding since admission. Remains stable. 3. Acute blood loss anemia secondary to GI bleed. Stable s/p transfusion shortly after admission. Hemoglobin remains stable. 4. Persistent borderline hypotension likely related to cirrhosis. Status post hemorrhagic shock secondary to GI bleed. Elevated WBC correlates with the initiation of stress dose steroids. Chest x-ray clear, no fever. No signs of infection. Cuff pressures artificially low. Arterial line lost. Asymptomatic.  Echocardiogram revealed normal left ventricular function. Started on stress dose steroids 11/10. Doubt blood pressure will go much higher. We will try to wean off pressors today. 5. Acute renal  failure secondary to ATN secondary to hypotension. Hepatorenal syndrome also considered. Continues to improve. Management per nephrology. 6. Abdominal pain, possible SBP on admission, fluid can obtained after the fact without evidence of SBP. Fluid culture and blood cultures no growth. Completed Levaquin. 7. Decompensated alcoholic, hepatitis C cirrhosis with varices, coagulopathy, Thrombocytopenia, ascites. Status post paracentesis 11/9. Completed course of Levaquin 11/11. MELD 30 and Child Pugh C. 3 month mortality 60-80 percent. 8. History of portal vein thrombosis. Not a candidate for anticoagulation secondary to GI bleed as above. 9. Paroxysmal atrial fibrillation, not a candidate for anticoagulation. 10. Nutrition. On TNA. Diet advanced to clears. 11. Chronic hypoxic respiratory failure on 2 L as needed according to chart. 12. Moderate malnutrition.   Overall no clinical change, he has tolerated extubation very well. Kidney function continues to slowly improve and he said no recurrent bleeding. Diet has been advanced and can be weaned off TNA soon. His blood pressure continues to run low but he is asymptomatic and I doubt we will see much improvement here. Plan to wean vasopressors today if possible.  Despite these clinical improvements he has severe cirrhosis with high three-month predicted mortality. I shared this with him and his daughter at bedside as well as the fact that he remains quite ill. At this point plan physical therapy and consideration of short-term rehabilitation. I broached the concept of hospice. Given his advanced disease it is likely he will be rehospitalized in the near future and it is not clear that this would be of any benefit to him.  No NGT secondary to bleeding varices.  Bowel regimen  Continue PPI twice a day.   Wean TNA soon.  Code Status: DNR/DNI DVT prophylaxis: SCDs Family Communication: discussed with daughter at bedside. Disposition Plan:  pending  Brendia Sacksaniel Vernell Townley, MD  [facility]  Pager [phone] If 7PM-7AM, please contact night-coverage at [email or web address], password [email or web address] 03/07/2014, 2:23  PM  LOS: 10 days   Consultants:  Gastroenterology  Pulmonology  Nephrology   Procedures:  Transfusion 4 units packed red blood cells  Transfusion 2 units FFP  ETT 11/3 >>  Central line catheter 11/3 >>  11/3 EGD: ENDOSCOPIC IMPRESSION: GI BLEED DUE TO ESOPHAGEAL VARIX IN PT WHO NOW AS A MELD SCORE OF 29 AND IS CHILD PUGH C. immediate complications. PT VOMITED BLOOD DURING PROCEDURE.  Large-volume paracentesis 11/9, 4 L  Echo:- Normal LV wall thickness with LVEF 60-65%, normal diastolic function. Mild left atrial enlargement. Mildly thickened mitral valve with bowing but no frank prolapse, trivial mtiral regugitation. Sclerotic aortic valve, noderately calcified noncoronary cusp. Unable to assess PASP.  Arterial line 11/10 >>   Antibiotics:  Levaquin 11/3 >> 11/11  HPI/Subjective: Tolerated extubation yesterday without event.  He seems to feel better today. No specific complaints. No vomiting. No pain. Breathing well.  Objective: Filed Vitals:   03/07/14 1315 03/07/14 1330 03/07/14 1345 03/07/14 1400  BP: 86/47 94/72 94/57  93/58  Pulse: 92 82 70 60  Temp:      TempSrc:      Resp: 16 20 14 15   Height:      Weight:      SpO2: 95% 97% 97% 100%    Intake/Output Summary (Last 24 hours) at 03/07/14 1423 Last data filed at 03/07/14 1405  Gross per 24 hour  Intake 10055.57 ml  Output   1950 ml  Net 8105.57 ml     Filed Weights   03/05/14 0500 03/06/14 0500 03/07/14 0500  Weight: 79.3 kg (174 lb 13.2 oz) 78 kg (171 lb 15.3 oz) 80.7 kg (177 lb 14.6 oz)    Exam:     Afebrile, vital signs stable. Stable systolic blood pressure in the 90s.  Gen. Appears calm, comfortable.  Psychiatric. Alert. Speech fluent and clear.  Cardiovascular regular rate and rhythm. No murmur, rub or gallop. No  lower extremity edema. Telemetry sinus rhythm.  Respiratory clear to auscultation bilaterally. No wheezes, rales or rhonchi. Normal respiratory effort.  Abdomen soft, nontender, nondistended.  Skin appears grossly unremarkable.  Data Reviewed:  Urine output 1450  Capillary blood sugar stable  BUN and creatinine continue to slowly improve. Potassium is normalized.  WBC 22.8 (almost certainly secondary to stress test steroids). Hemoglobin and platelet count remained stable.  Chest x-ray low lung volumes. Bibasilar atelectasis.  Scheduled Meds: . antiseptic oral rinse  7 mL Mouth Rinse QID  . chlorhexidine  15 mL Mouth Rinse BID  . folic acid  1 mg Oral Daily  . hydrocortisone sod succinate (SOLU-CORTEF) inj  50 mg Intravenous Q8H  . insulin aspart  0-9 Units Subcutaneous TID WC  . lactulose  20 g Oral BID  . pantoprazole (PROTONIX) IV  40 mg Intravenous BID  . sodium chloride  3 mL Intravenous Q12H   Continuous Infusions: . dextrose 5 % with KCl 20 mEq / L 20 mEq (03/07/14 0850)  . Marland Kitchen.TPN (CLINIMIX-E) Adult 80 mL/hr at 03/07/14 0600   And  . fat emulsion 250 mL (03/07/14 0600)  . Marland Kitchen.TPN (CLINIMIX-E) Adult     And  . fat emulsion    . phenylephrine (NEO-SYNEPHRINE) Adult infusion 40 mcg/min (03/07/14 1124)    Principal Problem:   Upper GI bleed Active Problems:   Abdominal pain   Hepatitis C   Esophageal varices   Cirrhosis   GIB (gastrointestinal bleeding)   Hemorrhagic shock   Acute renal failure   ATN (acute tubular necrosis)  Acute respiratory failure   Chronic hepatitis C without hepatic coma   Time spent 35 minutes greater than 50% in counseling and coordination of care

## 2014-03-07 NOTE — Evaluation (Signed)
Clinical/Bedside Swallow Evaluation Patient Details  Name: Darren Abbott E Harvell MRN: [id number] Date of Birth: 09/15/1955  Today's Date: 03/07/2014 Time: 1710-1728 SLP Time Calculation (min) (ACUTE ONLY): 18 min  Past Medical History:  Past Medical History  Diagnosis Date  . Malnutrition of moderate degree   . Altered mental status   . Unspecified essential hypertension   . Anxiety state, unspecified   . Transient ischemic attack (TIA), and cerebral infarction without residual deficits(V12.54)   . Body mass index between 19-24, adult   . Atrial fibrillation     episode at [place].  Marland Kitchen. Heart murmur   . Obstructive sleep apnea (adult) (pediatric)     to start back after surgery- not used for 2-213months  . GERD (gastroesophageal reflux disease)   . Unspecified epilepsy without mention of intractable epilepsy 13    only once  . Hand laceration 80    end of all fingers on rt hand  . Old myocardial infarction   . Asthma     "outgrew it"  . Borderline diabetes   . 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 Acute alcoholic hepatitis     possible hepatic encephalopathy May 10, 2011  . Daily headache   . Migraine     "daily" (08/30/2013)  . Stroke ~ 2000    "mild"  . Arthritis     "I'm ate up w/it" (08/30/2013)  . Chronic lower back pain   . On home oxygen therapy     "2L prn" (08/30/2013)  . Chronic kidney disease     "one isn't functioning like it should" (08/30/2013)  . Cirrhosis   . Esophageal varices   . Thrombus     chronic,junction of splenic portal vein  . Pulmonary nodules     chronic an new 2015  . Cataract    Past Surgical History:  Past Surgical History  Procedure Laterality Date  . Clavicle surgery Left ~ 2008    "crushed it"  . Hand surgery Left     lacerations of thumb. reattached  . Nasal septum surgery  08/30/2013  . Rhinoplasty  08/30/2013  . Hand surgery Right 1980's    "all 4 fingers cut off and reattached"  . Foot fracture  surgery Right   . Septoplasty N/A 08/30/2013    Procedure: SEPTOPLASTY;  Surgeon: Serena ColonelJefry Rosen, MD;  [facility];  Service: ENT;  Laterality: N/A;  . Rhinoplasty N/A 08/30/2013    Procedure: RHINOPLASTY;  Surgeon: Serena ColonelJefry Rosen, MD;  [facility];  Service: ENT;  Laterality: N/A;  . Esophagogastroduodenoscopy  Dec 2014    Dr. Samuella CotaPandya: Grade 2-3 esophageal varices s/p banding X 5. Erosive gastritis. Negative H.pylori  . Paracentesis      multiple  . Esophagogastroduodenoscopy N/A 03/04/2014    Procedure: ESOPHAGOGASTRODUODENOSCOPY (EGD);  Surgeon: West BaliSandi L Fields, MD;  Location: [facility];  Service: Endoscopy;  Laterality: N/A;   HPI:  Pt is a 58 y.o. male with PMH of PAF, CVA, Alcoholic cirrhosis, Hep C, Portal vein thrombus, H/o GIB presented with hematemesis, hematohezia for few days, associated with chronic abdominal pain- upper GI bleed- and found to have hypotension with Hg 7.0 on 11/3. Also hx of TIA, stroke in 2000, GERD, malnutrition. Guarded prognosis. Had been unable to tolerate POs. Pt had 9-day intubation- 11/3 to 11/12 (a.m.). CXR 11/13 revealed bibasilar atelectasis, no pleural effusion. Pt currently on full liquid diet. Bedside swallow eval ordered to assess swallow function  post- prolonged extubation.   Assessment / Plan / Recommendation Clinical Impression  Pt demonstrated immediate strong cough following 1 out of 8 sips of thin liquid by straw. Followed cues to take small bites/ sips, after which no further overt s/s of aspiration occurred. Swallow appeared timely with adequate hyolaryngeal elevation. Given these findings risk of aspiration appears mild-moderate given prolonged 9-day intubation; risk decreased with supervision to provide cues to take small bites/ sips. Oropharyngeal swallow appears adequate for thin liquid/ puree diet. Recommend continuing full liquid diet, advance if MD recommends. Full supervision to cue pt to take small bites/ sips and to aide in self feeding-  pt is currently impulsive and demonstrating difficulties with feeding at this point. Meds crushed in puree or whole in puree if unable to crush. SLP will f/u x1 to ensure diet tolerance.    Aspiration Risk  Moderate    Diet Recommendation Thin liquid;Dysphagia 1 (Puree)   Liquid Administration via: Cup;Straw Medication Administration: Crushed with puree Supervision: Staff to assist with self feeding;Full supervision/cueing for compensatory strategies Compensations: Slow rate;Small sips/bites Postural Changes and/or Swallow Maneuvers: Seated upright 90 degrees;Upright 30-60 min after meal    Other  Recommendations Oral Care Recommendations: Oral care BID   Follow Up Recommendations  24 hour supervision/assistance    Frequency and Duration min 1 x/week  1 week   Pertinent Vitals/Pain n/a    SLP Swallow Goals     Swallow Study Prior Functional Status       General HPI: Pt is a 58 y.o. male with PMH of PAF, CVA, Alcoholic cirrhosis, Hep C, Portal vein thrombus, H/o GIB presented with hematemesis, hematohezia for few days, associated with chronic abdominal pain- upper GI bleed- and found to have hypotension with Hg 7.0 on 11/3. Also hx of TIA, stroke in 2000, GERD, malnutrition. Guarded prognosis. Had been unable to tolerate POs. Pt had 9-day intubation- 11/3 to 11/12 (a.m.). CXR 11/13 revealed bibasilar atelectasis, no pleural effusion. Pt currently on full liquid diet. Bedside swallow eval ordered to assess swallow function post- prolonged extubation. Type of Study: Bedside swallow evaluation Diet Prior to this Study: Thin liquids;Other (Comment) (full liquid) Temperature Spikes Noted: No Respiratory Status: Nasal cannula History of Recent Intubation: Yes Length of Intubations (days): 9 days Date extubated: 03/06/14 Behavior/Cognition: Alert;Cooperative;Pleasant mood;Requires cueing Oral Cavity - Dentition: Missing dentition;Poor condition Self-Feeding Abilities: Needs  assist Patient Positioning: Upright in bed Baseline Vocal Quality: Breathy Volitional Cough: Strong Volitional Swallow: Able to elicit    Oral/Motor/Sensory Function Overall Oral Motor/Sensory Function: Appears within functional limits for tasks assessed   Ice Chips Ice chips: Not tested   Thin Liquid Thin Liquid: Impaired Presentation: Straw Pharyngeal  Phase Impairments: Cough - Immediate    Nectar Thick Nectar Thick Liquid: Not tested   Honey Thick Honey Thick Liquid: Not tested   Puree Puree: Within functional limits Presentation: Self Fed;Spoon   Solid   GO    Solid: Not tested       Rebecca Eatonleksiak, Amy K, MA, CCC-SLP 03/07/2014,5:41 PM

## 2014-03-08 LAB — GLUCOSE, CAPILLARY
GLUCOSE-CAPILLARY: 155 mg/dL — AB (ref 70–99)
GLUCOSE-CAPILLARY: 123 mg/dL — AB (ref 70–99)
GLUCOSE-CAPILLARY: 170 mg/dL — AB (ref 70–99)
Glucose-Capillary: 143 mg/dL — ABNORMAL HIGH (ref 70–99)

## 2014-03-08 LAB — BASIC METABOLIC PANEL
ANION GAP: 10 (ref 5–15)
BUN: 50 mg/dL — ABNORMAL HIGH (ref 6–23)
CHLORIDE: 104 meq/L (ref 96–112)
CO2: 26 mEq/L (ref 19–32)
CREATININE: 1.32 mg/dL (ref 0.50–1.35)
Calcium: 7.7 mg/dL — ABNORMAL LOW (ref 8.4–10.5)
GFR calc Af Amer: 67 mL/min — ABNORMAL LOW (ref >90)
GFR calc non Af Amer: 58 mL/min — ABNORMAL LOW (ref >90)
Glucose, Bld: 164 mg/dL — ABNORMAL HIGH (ref 70–99)
POTASSIUM: 4.2 meq/L (ref 3.7–5.3)
Sodium: 140 mEq/L (ref 137–147)

## 2014-03-08 LAB — CULTURE, BLOOD (ROUTINE X 2)
CULTURE: NO GROWTH
Culture: NO GROWTH

## 2014-03-08 LAB — CBC
HCT: 29.4 % — ABNORMAL LOW (ref 39.0–52.0)
Hemoglobin: 9.7 g/dL — ABNORMAL LOW (ref 13.0–17.0)
MCH: 30.5 pg (ref 26.0–34.0)
MCHC: 33 g/dL (ref 30.0–36.0)
MCV: 92.5 fL (ref 78.0–100.0)
PLATELETS: 37 10*3/uL — AB (ref 150–400)
RBC: 3.18 MIL/uL — ABNORMAL LOW (ref 4.22–5.81)
RDW: 17.6 % — AB (ref 11.5–15.5)
WBC: 14.3 10*3/uL — ABNORMAL HIGH (ref 4.0–10.5)

## 2014-03-08 MED ORDER — LORAZEPAM 0.5 MG PO TABS
0.5000 mg | ORAL_TABLET | Freq: Four times a day (QID) | ORAL | Status: DC | PRN
  Administered 2014-03-09 (×3): 0.5 mg via ORAL
  Filled 2014-03-08 (×4): qty 1

## 2014-03-08 MED ORDER — OXYCODONE HCL 5 MG PO TABS
5.0000 mg | ORAL_TABLET | ORAL | Status: DC | PRN
  Administered 2014-03-08 – 2014-03-09 (×4): 5 mg via ORAL
  Filled 2014-03-08 (×4): qty 1

## 2014-03-08 MED ORDER — CETYLPYRIDINIUM CHLORIDE 0.05 % MT LIQD
7.0000 mL | Freq: Two times a day (BID) | OROMUCOSAL | Status: DC
  Administered 2014-03-08 – 2014-03-09 (×2): 7 mL via OROMUCOSAL

## 2014-03-08 NOTE — Progress Notes (Signed)
PROGRESS NOTE  Darren Abbott WGN:[id number] DOB: 1955/07/12 DOA: 2014/03/07 PCP: Augustine Radar, MD  Summary: 58 year old man presented 11/3 with hematemesis. Quickly decompensated, was intubated for airway protection, placed on vasopressors for hemorrhagic shock and underwent urgent EGD at the bedside same day of admission as well as transfusion of packed red blood cells and FFP. Presumed etiology of bleeding esophageal varices, treated with octreotide infusion, PPI. Hospitalization has been prolonged by persistent hypotension of unclear etiology. Unable to wean from ventilator while hypotensive. After further discussion with pulmonology and involved family members, family  requested removal of endotracheal tube, full DO NOT RESUSCITATE status, no reintubation. Patient successfully tolerated extubation and a respiratory status remains stable in nasal cannula. Renal function has improved to essentially normal and he has had no recurrent bleeding. He remains with borderline hypotension likely secondary to cirrhosis and attempts are being made to wean off vasopressors. Plan to mobilize, anticipate transfer to skilled nursing facility in the next several days if he continues to improve. Long-term prognosis is poor which has been discussed with the patient and hospice would be a consideration.  Assessment/Plan: 1. Acute respiratory failure with hypoxia, intubated 11/3 after admission for airway protection secondary to acute GI bleed, hematemesis. Successfully extubated 11/12. Appears stable. 2. GIB secondary to esophageal varices, complicated by outpatient anticoagulation with Xarelto. Status post endoscopy with esophageal banding 11/3, octreotide infusion, transfusion 4 units packed red blood cells, 2 units FFP. If rebleeds, may need TIPS. No bleeding since admission. Remains stable. 3. Acute blood loss anemia secondary to GI bleed. Stable s/p transfusion shortly after admission. Hemoglobin remains  stable. 4. Persistent borderline hypotension likely related to cirrhosis. Status post hemorrhagic shock secondary to GI bleed. Elevated WBC correlates with the initiation of stress dose steroids and has decreased today. Chest x-ray clear, no fever. No signs of infection. Cuff pressures artificially low.   Echocardiogram revealed normal left ventricular function. Started on stress dose steroids 11/10. Doubt blood pressure will go much higher. We will try to wean off pressors again today. 5. Acute renal failure, nonoliguric, secondary to ATN secondary to hypotension. Hepatorenal syndrome also considered. Appears to have resolved at this point. Management per nephrology. 6. Abdominal pain, resolved,possible SBP on admission, fluid obtained after the fact without evidence of SBP. Fluid culture and blood cultures no growth. Completed Levaquin. 7. Decompensated alcoholic, hepatitis C cirrhosis with varices, coagulopathy, Thrombocytopenia, ascites. Status post paracentesis 11/9. Completed course of Levaquin 11/11. MELD 30 and Child Pugh C. 3 month mortality 60-80 percent. 8. History of portal vein thrombosis. Not a candidate for anticoagulation secondary to GI bleed as above. 9. Paroxysmal atrial fibrillation, not a candidate for anticoagulation. 10. Nutrition. Diet advancing. Plan on stopping TNA today. 11. Chronic hypoxic respiratory failure on 2 L as needed according to chart. 12. Moderate malnutrition.    Overall his clinical condition remains stable. Respiratory status stable on nasal cannula oxygen and renal function normalizing. Blood pressure remains borderline low, likely related to cirrhosis as there is no evidence of infection. He has had no bleeding and is tolerating an advancement in his diet. Despite these improvements his prognosis is poor because of end-stage cirrhosis, he remains anemic and thrombocytopenic.  Plan to advance start today based on speech therapy recommendations, out of bed to  chair/mobilize, wean off vasopressors today for systolic blood pressure greater than 85, wean off TNA today.  PT evaluation when available  No NGT secondary to bleeding varices.  Bowel regimen (had bowel movement yesterday)  Continue  PPI twice a day.   Anticipate transfer to skilled nursing facility in the next few days. We did discuss the possibility of hospice as well as yesterday. He is considering his wishes going forward.  Code Status: DNR/DNI DVT prophylaxis: SCDs Family Communication: discussed with daughter at bedside again last evening 11/13 Disposition Plan: pending  [name], MD  [facility]  Pager [phone] If 7PM-7AM, please contact night-coverage at [email or web address], password [email or web address] 03/08/2014, 9:46 AM  LOS: 11 days   Consultants:  Gastroenterology  Pulmonology  Nephrology   Speech therapy dysphagia 1 diet, thin liquids  Procedures:  Transfusion 4 units packed red blood cells  Transfusion 2 units FFP  ETT 11/3 >>  Central line catheter 11/3 >>  11/3 EGD: ENDOSCOPIC IMPRESSION: GI BLEED DUE TO ESOPHAGEAL VARIX IN PT WHO NOW AS A MELD SCORE OF 29 AND IS CHILD PUGH C. immediate complications. PT VOMITED BLOOD DURING PROCEDURE.  Large-volume paracentesis 11/9, 4 L  Echo:- Normal LV wall thickness with LVEF 60-65%, normal diastolic function. Mild left atrial enlargement. Mildly thickened mitral valve with bowing but no frank prolapse, trivial mtiral regugitation. Sclerotic aortic valve, noderately calcified noncoronary cusp. Unable to assess PASP.  Arterial line 11/10 >>   Antibiotics:  Levaquin 11/3 >> 11/11  HPI/Subjective: No issues overnight per nursing. Tolerating liquid diet. No vomiting. Still on vasopressors.  Patient denies abdominal pain, no nausea or vomiting. Breathing well. No complaints.  Objective: Filed Vitals:   03/08/14 0830 03/08/14 0845 03/08/14 0900 03/08/14 0915  BP: 92/56 89/51 99/57  105/57  Pulse: 80  82 88 95  Temp:      TempSrc:      Resp: 19 23 25 24   Height:      Weight:      SpO2: 98% 99% 99% 100%    Intake/Output Summary (Last 24 hours) at 03/08/14 0946 Last data filed at 03/08/14 0500  Gross per 24 hour  Intake 3401.65 ml  Output    771 ml  Net 2630.65 ml     Filed Weights   03/06/14 0500 03/07/14 0500 03/08/14 0500  Weight: 78 kg (171 lb 15.3 oz) 80.7 kg (177 lb 14.6 oz) 81 kg (178 lb 9.2 oz)    Exam:     Afebrile, vital signs stable. SBP 105. SPO2 100% onnasal cannula.  Gen. Appears calm, comfortable, chronically ill. Nontoxic.  Cardiovascular regular rate and rhythm. No murmur, rub or gallop. No lower extremity edema. Telemetry sinus rhythm.  Respiratory clear to auscultation bilaterally. No wheezes, rales or rhonchi. Normal respiratory effort.  Abdomen soft, nontender, mildly distended.  Skin appears grossly unremarkable.  Musculoskeletal tone and strength appears grossly intact.  Data Reviewed:  Urine output 771  CBG stable.  Creatinine has normalized. BUN continues to trend downwards. Potassium 4.2.  WBC has decreased significantly, 14.3. Hemoglobin stable 9.7. Platelet count 37.  Scheduled Meds: . antiseptic oral rinse  7 mL Mouth Rinse QID  . chlorhexidine  15 mL Mouth Rinse BID  . folic acid  1 mg Oral Daily  . hydrocortisone sod succinate (SOLU-CORTEF) inj  50 mg Intravenous Q8H  . insulin aspart  0-9 Units Subcutaneous TID WC  . lactulose  20 g Oral BID  . pantoprazole (PROTONIX) IV  40 mg Intravenous BID  . sodium chloride  3 mL Intravenous Q12H   Continuous Infusions: . Marland Kitchen.TPN (CLINIMIX-E) Adult 80 mL/hr at 03/08/14 0500   And  . fat emulsion 500 kcal (03/08/14 0500)  . phenylephrine (NEO-SYNEPHRINE) Adult infusion 15  mcg/min (03/08/14 [id number])    Principal Problem:   Upper GI bleed Active Problems:   Abdominal pain   Hepatitis C   Esophageal varices   Cirrhosis   GIB (gastrointestinal bleeding)   Hemorrhagic shock   Acute  renal failure   ATN (acute tubular necrosis)   Acute respiratory failure   Chronic hepatitis C without hepatic coma   Arterial hypotension   Time spent 25 minutes

## 2014-03-08 NOTE — Progress Notes (Signed)
Subjective: Interval History: Patient offers no complaints. Patient denies any difficulty in breathing.  Objective: Vital signs in last 24 hours: Temp:  [96.5 F (35.8 C)-98.6 F (37 C)] 98.6 F (37 C) (11/14 0803) Pulse Rate:  [60-98] 90 (11/14 0545) Resp:  [9-34] 26 (11/14 0545) BP: (84-121)/(24-83) 97/53 mmHg (11/14 0545) SpO2:  [86 %-100 %] 98 % (11/14 0545) Weight:  [81 kg (178 lb 9.2 oz)] 81 kg (178 lb 9.2 oz) (11/14 0500) Weight change: 0.3 kg (10.6 oz)  Intake/Output from previous day: 11/13 0701 - 11/14 0700 In: 3401.7 [I.V.:1331.7; TPN:2070] Out: 771 [Urine:770; Stool:1] Intake/Output this shift:   Patient is extubated and presently he is alert an in no apparent distress Chest : decreased breath sound bilaterally. Heart exam revealed regular rate and rhythm, no murmur Abdomen: full and none tender,positive bowl sound Extremities:  trace edema   Lab Results:  Recent Labs  03/07/14 0849 03/08/14 0545  WBC 22.8* 14.3*  HGB 10.9* 9.7*  HCT 32.5* 29.4*  PLT 55* 37*   BMET:   Recent Labs  03/07/14 0442 03/08/14 0545  NA 144 140  K 3.7 4.2  CL 108 104  CO2 28 26  GLUCOSE 176* 164*  BUN 52* 50*  CREATININE 1.40* 1.32  CALCIUM 8.0* 7.7*   No results for input(s): PTH in the last 72 hours. Iron Studies: No results for input(s): IRON, TIBC, TRANSFERRIN, FERRITIN in the last 72 hours.  Studies/Results: Dg Chest Port 1 View  03/07/2014   CLINICAL DATA:  Acute respiratory failure .  EXAM: PORTABLE CHEST - 1 VIEW  COMPARISON:  03/06/2014.  FINDINGS: Left IJ line stable position. Mediastinum and hilar structures are normal. Stable cardiomegaly pulmonary vascularity. Low lung volumes with persistent bibasilar atelectasis. No pleural effusion or pneumothorax. Plate and screw fixation left clavicle.  IMPRESSION: 1. Left IJ line in stable position. 2. Low lung volumes with persistent bibasilar atelectasis.   Electronically Signed   By: Maisie Fushomas  Register   On:  03/07/2014 07:47   Dg Chest Port 1 View  03/06/2014   CLINICAL DATA:  Respiratory failure  EXAM: PORTABLE CHEST - 1 VIEW  COMPARISON:  03/02/2014  FINDINGS: Cardiomediastinal silhouette is stable. No acute infiltrate or pleural effusion. No pulmonary edema. Persistent bilateral basilar atelectasis or scarring. Left IJ central line is unchanged in position. Endotracheal tube has been removed. Again noted metallic fixation plate distal left clavicle.  IMPRESSION: No active disease. Persistent bilateral basilar atelectasis or scarring. Left IJ central line is unchanged in position.   Electronically Signed   By: Natasha MeadLiviu  Pop M.D.   On: 03/06/2014 14:15    I have reviewed the patient's current medications.  Assessment/Plan: Problem #1 acute kidney injury: Possibly prerenal versus ATN. His renal function continue to improve. His renal function is returning to his baseline. Problem #2 hypotension:  His systolic blood pressure is low but stable Problem #3 history of liver cirrhosis. Problem #4 history of hepatitis C Problem #5 a trial fibrillation: His heart rate is controlled Problem #6 anemia: s/p blood transfussion his hemoglobin is declining from yesterday. Problem#7 Hypokalemia: Patient is on potassium supplement and his potassium has corrected Problem#8 hypernatremia:  Sodium has normalized Plan:1] DC IV fluid 2]Basic metabolic panel in am    LOS: 11 days   Izel Hochberg S 03/08/2014,8:09 AM

## 2014-03-08 NOTE — Progress Notes (Signed)
Pt up sitting in the recliner.

## 2014-03-09 LAB — BASIC METABOLIC PANEL
ANION GAP: 11 (ref 5–15)
BUN: 57 mg/dL — ABNORMAL HIGH (ref 6–23)
CHLORIDE: 102 meq/L (ref 96–112)
CO2: 25 meq/L (ref 19–32)
Calcium: 8.2 mg/dL — ABNORMAL LOW (ref 8.4–10.5)
Creatinine, Ser: 1.41 mg/dL — ABNORMAL HIGH (ref 0.50–1.35)
GFR calc Af Amer: 62 mL/min — ABNORMAL LOW (ref >90)
GFR calc non Af Amer: 53 mL/min — ABNORMAL LOW (ref >90)
Glucose, Bld: 115 mg/dL — ABNORMAL HIGH (ref 70–99)
Potassium: 4.7 mEq/L (ref 3.7–5.3)
SODIUM: 138 meq/L (ref 137–147)

## 2014-03-09 LAB — GLUCOSE, CAPILLARY
Glucose-Capillary: 105 mg/dL — ABNORMAL HIGH (ref 70–99)
Glucose-Capillary: 113 mg/dL — ABNORMAL HIGH (ref 70–99)
Glucose-Capillary: 116 mg/dL — ABNORMAL HIGH (ref 70–99)

## 2014-03-09 MED ORDER — MORPHINE SULFATE (CONCENTRATE) 10 MG/0.5ML PO SOLN
5.0000 mg | ORAL | Status: DC | PRN
  Administered 2014-03-09: 5 mg via ORAL
  Filled 2014-03-09: qty 0.5

## 2014-03-09 MED ORDER — PROMETHAZINE HCL 12.5 MG PO TABS
12.5000 mg | ORAL_TABLET | Freq: Four times a day (QID) | ORAL | Status: DC | PRN
  Administered 2014-03-09: 12.5 mg via ORAL
  Filled 2014-03-09: qty 1

## 2014-03-09 MED ORDER — PHENYLEPHRINE HCL 10 MG/ML IJ SOLN
INTRAMUSCULAR | Status: AC
  Filled 2014-03-09: qty 4

## 2014-03-09 MED ORDER — MORPHINE SULFATE 10 MG/5ML PO SOLN: 5.0000 mg | ORAL | Status: DC | PRN

## 2014-03-09 MED ORDER — PROMETHAZINE HCL 12.5 MG PO TABS: 6.2500 mg | ORAL_TABLET | Freq: Three times a day (TID) | ORAL | Status: DC | PRN

## 2014-03-09 MED ORDER — SODIUM CHLORIDE 0.9 % IV SOLN
INTRAVENOUS | Status: DC
  Administered 2014-03-09: 11:00:00 via INTRAVENOUS

## 2014-03-09 MED ORDER — PANTOPRAZOLE SODIUM 40 MG PO TBEC
40.0000 mg | DELAYED_RELEASE_TABLET | Freq: Two times a day (BID) | ORAL | Status: DC
  Administered 2014-03-09: 40 mg via ORAL
  Filled 2014-03-09: qty 1

## 2014-03-09 MED ORDER — PANTOPRAZOLE SODIUM 40 MG PO PACK
40.0000 mg | PACK | Freq: Two times a day (BID) | ORAL | Status: DC
  Filled 2014-03-09 (×2): qty 20

## 2014-03-09 NOTE — Progress Notes (Signed)
Subjective: Interval History: Patient is somnolent but arousable. He offers no complaint.  Objective: Vital signs in last 24 hours: Temp:  [97.3 F (36.3 C)-99 F (37.2 C)] 97.3 F (36.3 C) (11/15 0824) Pulse Rate:  [25-111] 94 (11/15 0830) Resp:  [13-33] 27 (11/15 0830) BP: (81-159)/(42-138) 89/56 mmHg (11/15 0830) SpO2:  [90 %-100 %] 97 % (11/15 0830) Weight:  [82.3 kg (181 lb 7 oz)] 82.3 kg (181 lb 7 oz) (11/15 0500) Weight change: 1.3 kg (2 lb 13.9 oz)  Intake/Output from previous day: 11/14 0701 - 11/15 0700 In: 1363.2 [P.O.:1200; I.V.:163.2] Out: 1525 [Urine:1525] Intake/Output this shift:   Patient is sleepy. He doesn't seem to be in any apparent distress. He is arousable. Chest : decreased breath sound bilaterally. Heart exam revealed regular rate and rhythm, no murmur Abdomen: full and none tender,positive bowl sound Extremities:  trace edema   Lab Results:  Recent Labs  03/07/14 0849 03/08/14 0545  WBC 22.8* 14.3*  HGB 10.9* 9.7*  HCT 32.5* 29.4*  PLT 55* 37*   BMET:   Recent Labs  03/08/14 0545 10/11/13 0617  NA 140 138  K 4.2 4.7  CL 104 102  CO2 26 25  GLUCOSE 164* 115*  BUN 50* 57*  CREATININE 1.32 1.41*  CALCIUM 7.7* 8.2*   No results for input(s): PTH in the last 72 hours. Iron Studies: No results for input(s): IRON, TIBC, TRANSFERRIN, FERRITIN in the last 72 hours.  Studies/Results: No results found.  I have reviewed the patient's current medications.  Assessment/Plan: Problem #1 acute kidney injury: Possibly prerenal versus ATN. His BUN and creatinine slightly up possibly from lack of fluid intake. His IV was discontinued yesterday. Problem #2 hypotension:  His systolic blood pressure is low but stable Problem #3 history of liver cirrhosis. Problem #4 history of hepatitis C Problem #5 a trial fibrillation: His heart rate is controlled Problem #6 anemia: s/p blood transfussion his hemoglobin is declining further. Problem#7  Hypokalemia: his potassium was corrected. Problem#8 hypernatremia:  Sodium has normalized Plan:1] we'll start patient on normal saline at 50 mL per hour until his by mouth intake improves. 2]Basic metabolic panel in am    LOS: 12 days   Brainard Highfill S 09-Apr-2014,10:07 AM

## 2014-03-09 NOTE — Progress Notes (Signed)
Patient given morphine for agitation and pain purposes. Upon reassessment patient has agonal breathing. Heart rate brady down to 40s then asystole. Patient is DNR. Family notified. Staff at bedside. Time of passing 2050 verified by monitor and 2 RN auscultation.

## 2014-03-09 NOTE — Progress Notes (Addendum)
PROGRESS NOTE  PETERSON MATHEY WUJ:[id number] DOB: [birth year] DOA: 03/13/2014 PCP: Augustine Radar, MD   Addendum 1600 Discussed with [name], daughter in person, who has been designated by family to make decisions. She notes Mr. Balke continues to decline, is more confused and appears worse. She has spoken with family and thinks hospice should be pursued. She does not expect him to live much longer and wants him to be comfortable. I concur. For now, she wants to continue vasopressor and current meds, talk with family, but tentatively plan to discontinue vasopressor 11/16 and pursue residential hospice. If he were to bleed, he should be made comfort care and no interventions should be pursued.  Summary: 58 year old man presented 11/3 with hematemesis. Quickly decompensated, was intubated for airway protection, placed on vasopressors for hemorrhagic shock and underwent urgent EGD at the bedside same day of admission as well as transfusion of packed red blood cells and FFP. Presumed etiology of bleeding esophageal varices, treated with octreotide infusion, PPI. Hospitalization has been prolonged by persistent hypotension of unclear etiology. Unable to wean from ventilator while hypotensive. After further discussion with pulmonology and involved family members, family  requested removal of endotracheal tube, full DO NOT RESUSCITATE status, no reintubation. Patient successfully tolerated extubation and a respiratory status remains stable in nasal cannula. Renal function has improved to essentially normal and he has had no recurrent bleeding. He remains with borderline hypotension likely secondary to cirrhosis and attempts are being made to wean off vasopressors. Plan to mobilize, anticipate transfer to skilled nursing facility in the next several days if he continues to improve. Long-term prognosis is poor which has been discussed with the patient and hospice would be a  consideration.  Assessment/Plan: 1. Persistent borderline hypotension likely related to cirrhosis. Status post hemorrhagic shock secondary to GI bleed. Elevated WBC correlates with the initiation of stress dose steroids and has decreased. Chest x-ray clear, no fever. No signs of infection. Cuff pressures artificially low.  Echocardiogram revealed normal left ventricular function. Started on stress dose steroids 11/10, currently slowly weaning. Doubt blood pressure will go much higher. Will try to wean off pressors. 2. Acute respiratory failure with hypoxia, intubated 11/3 after admission for airway protection secondary to acute GI bleed, hematemesis. Successfully extubated 11/12. Stable. 3. GIB secondary to esophageal varices, complicated by outpatient anticoagulation with Xarelto. Status post endoscopy with esophageal banding 11/3, octreotide infusion, transfusion 4 units packed red blood cells, 2 units FFP. If rebleeds, may need TIPS. No bleeding since admission. Stable. 4. Acute blood loss anemia secondary to GI bleed. Stable s/p transfusion shortly after admission. Hemoglobin remains stable. 5. Acute renal failure, nonoliguric, secondary to ATN secondary to hypotension. Hepatorenal syndrome also considered. Appears to have resolved at this point. Management per nephrology. 6. Abdominal pain, mild, recurrent,possible SBP on admission, fluid obtained after the fact without evidence of SBP. Fluid culture and blood cultures no growth. Completed Levaquin. Likely secondary to recurrent ascites. 7. Decompensated alcoholic, hepatitis C cirrhosis with varices, coagulopathy, Thrombocytopenia, ascites. Status post paracentesis 11/9. Completed course of Levaquin 11/11. MELD 30 and Child Pugh C. 3 month mortality 60-80 percent. 8. History of portal vein thrombosis. Not a candidate for anticoagulation secondary to GI bleed as above. 9. Paroxysmal atrial fibrillation, not a candidate for  anticoagulation. 10. Chronic hypoxic respiratory failure on 2 L as needed 11. Moderate malnutrition.    Overall no significant change, continues to require low-dose vasopressor. There is no evidence of infection. Suspect this condition will not significantly improve. Continue  to try to wean off.  Kidney function overall stable, management per nephrology appreciated.  Large-volume paracentesis in the morning.  CBC and CMP in AM  PT evaluation   No NGT secondary to varices.  Continue PPI twice a day.   Overall his prognosis remains poor with high predicted mortality in the next 3 months time is liver disease.  Code Status: DNR/DNI DVT prophylaxis: SCDs Family Communication:  Disposition Plan: pending  Brendia Sacksaniel Goodrich, MD  [facility]  Pager [phone] If 7PM-7AM, please contact night-coverage at [email or web address], password [email or web address] 02/24/2014, 1:36 PM  LOS: 12 days   Consultants:  Gastroenterology  Pulmonology  Nephrology   Speech therapy dysphagia 1 diet, thin liquids  Procedures:  Transfusion 4 units packed red blood cells  Transfusion 2 units FFP  ETT 11/3 >>  Central line catheter 11/3 >>  11/3 EGD: ENDOSCOPIC IMPRESSION: GI BLEED DUE TO ESOPHAGEAL VARIX IN PT WHO NOW AS A MELD SCORE OF 29 AND IS CHILD PUGH C. immediate complications. PT VOMITED BLOOD DURING PROCEDURE.  Large-volume paracentesis 11/9, 4 L  Echo:- Normal LV wall thickness with LVEF 60-65%, normal diastolic function. Mild left atrial enlargement. Mildly thickened mitral valve with bowing but no frank prolapse, trivial mtiral regugitation. Sclerotic aortic valve, noderately calcified noncoronary cusp. Unable to assess PASP.  Arterial line 11/10 >>   Antibiotics:  Levaquin 11/3 >> 11/11  HPI/Subjective: Episode of vomiting yesterday. Currently the second. Overall feeling okay. He has some abdominal distention with some mild pain. Tolerating liquids.  Objective: Filed Vitals:    02/23/2014 1045 03/12/2014 1100 02/28/2014 1115 03/02/2014 1208  BP: 97/58 93/64 99/63    Pulse: 97 97 111   Temp:    97.1 F (36.2 C)  TempSrc:    Oral  Resp:      Height:      Weight:      SpO2: 97% 91% 97%     Intake/Output Summary (Last 24 hours) at 03/01/2014 1336 Last data filed at 03/21/2014 0600  Gross per 24 hour  Intake 1363.2 ml  Output   1525 ml  Net -161.8 ml     Filed Weights   03/07/14 0500 03/08/14 0500 03/07/2014 0500  Weight: 80.7 kg (177 lb 14.6 oz) 81 kg (178 lb 9.2 oz) 82.3 kg (181 lb 7 oz)    Exam:     Afebrile overall vitals are stable. Systolic blood pressure predominantly in the 90s, still on low-dose vasopressor.  Gen. Appears calm, mildly uncomfortable. Ill but nontoxic. Hiccups.  Cardiovascular regular rate and rhythm. No murmur, rub or gallop. 2+ bilateral lower extremity edema. Telemetry sinus rhythm.  Respiratory clear to auscultation bilaterally. No wheezes, rales or rhonchi. Normal respiratory effort.  Abdomen soft, distended, edematous, mild mental right lower pain with palpation.  Data Reviewed:  Urine output 1525, BM 1  BUN and creatinine slightly elevated, likely related to decreased fluid intake. Potassium normal.  Scheduled Meds: . antiseptic oral rinse  7 mL Mouth Rinse BID  . folic acid  1 mg Oral Daily  . hydrocortisone sod succinate (SOLU-CORTEF) inj  50 mg Intravenous Q8H  . insulin aspart  0-9 Units Subcutaneous TID WC  . lactulose  20 g Oral BID  . pantoprazole (PROTONIX) IV  40 mg Intravenous BID  . sodium chloride  3 mL Intravenous Q12H   Continuous Infusions: . sodium chloride 50 mL/hr at 03/19/2014 1033  . phenylephrine (NEO-SYNEPHRINE) Adult infusion 20 mcg/min (03/08/14 1800)    Principal Problem:   Upper GI bleed  Active Problems:   Abdominal pain   Hepatitis C   Esophageal varices   Cirrhosis   GIB (gastrointestinal bleeding)   Hemorrhagic shock   Acute renal failure   ATN (acute tubular necrosis)   Acute  respiratory failure   Chronic hepatitis C without hepatic coma   Arterial hypotension   Time spent 20 minutes

## 2014-03-10 DIAGNOSIS — K7030 Alcoholic cirrhosis of liver without ascites: Secondary | ICD-10-CM

## 2014-03-10 DIAGNOSIS — R578 Other shock: Secondary | ICD-10-CM

## 2014-03-13 ENCOUNTER — Ambulatory Visit: Payer: Medicaid Other | Admitting: Gastroenterology

## 2014-03-25 NOTE — Discharge Summary (Signed)
Death Summary  Darren Abbott WGN:[id number] DOB: 08/12/1955 DOA: 03/11/2014  PCP: Augustine RadarOBERSON, KRISTINA, MD   Admit date: 03/12/2014 Date of Death: 03/18/2014  Final Diagnoses:  1. Endstage cirrhosis secondary to hepatitis C, alcohol with associated varices, hypotension, renal failure, thrombocytopenia 2. GI bleed secondary to esophageal varices 3. Acute blood loss anemia secondary to GI bleed 4. Acute hypoxic respiratory failure ventilator dependent 5. Acute renal failure secondary to ATN 6. Possible SBP 7. History of portal vein thrombosis 8. Paroxysmal atrial fibrillation 9. Moderate malnutrition  History of present illness:  58 year old man PMH cirrhosis, hepatitis C presented 11/3 with hematemesis.   Hospital Course:  [name] quickly decompensated, was intubated for airway protection, placed on vasopressors for hemorrhagic shock and underwent urgent EGD at the bedside same day of admission, as well as transfusion of packed red blood cells and FFP. Presumed etiology was bleeding esophageal varices, treated with octreotide infusion, PPI. Afterwards the patient had no recurrent bleeding. Per gastroenterology, MELD 32 with 3 month mortality of 60-80%. Hospitalization was prolonged by persistent hypotension and inability to wean from ventilator while hypotensive, as well as possible SBP and acute renal failure. After further discussion with pulmonology and involved family members, family requested removal of endotracheal tube, full DO NOT RESUSCITATE status, no reintubation. Patient successfully tolerated extubation respiratory status remained stable on nasal cannula. Renal function gradually improved. However despite improvement in renal function, resolution of ventilator dependent respiratory failure and acute renal failure, the patient remained hypotensive became more encephalopathic and condition gradually declined. After further discussion with family, plans were tentatively made for hospice  as prognosis was felt to be less than 1-2 weeks. The patient subsequently died 11/15 secondary to end-stage cirrhosis.  Time: 25 minutes  Signed:  Brendia Sacksaniel Dallas Scorsone, MD  [facility] 03/14/2014, 12:34 PM

## 2014-03-25 DEATH — deceased

## 2016-03-25 IMAGING — CR DG LUMBAR SPINE 2-3V
2 series · 2 of 2 positions shown · non-contrast
Comparison: None.

CLINICAL DATA: Low back pain

EXAM:
LUMBAR SPINE - 2-3 VIEW

[view not recorded (1 of 2)]
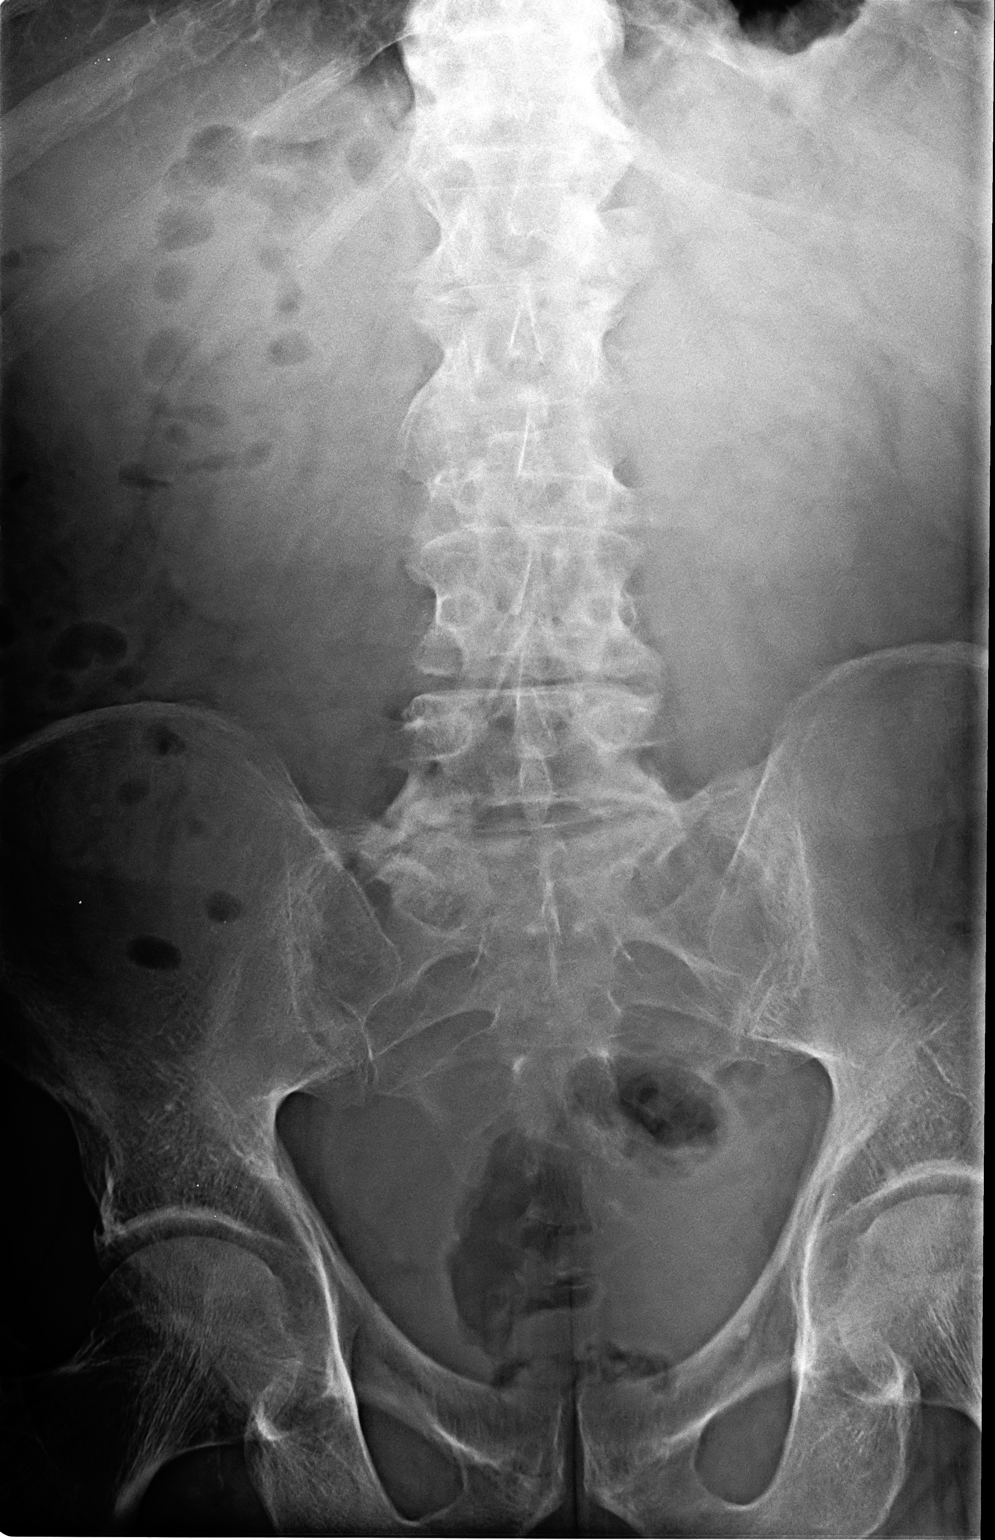

[view not recorded (2 of 2)]
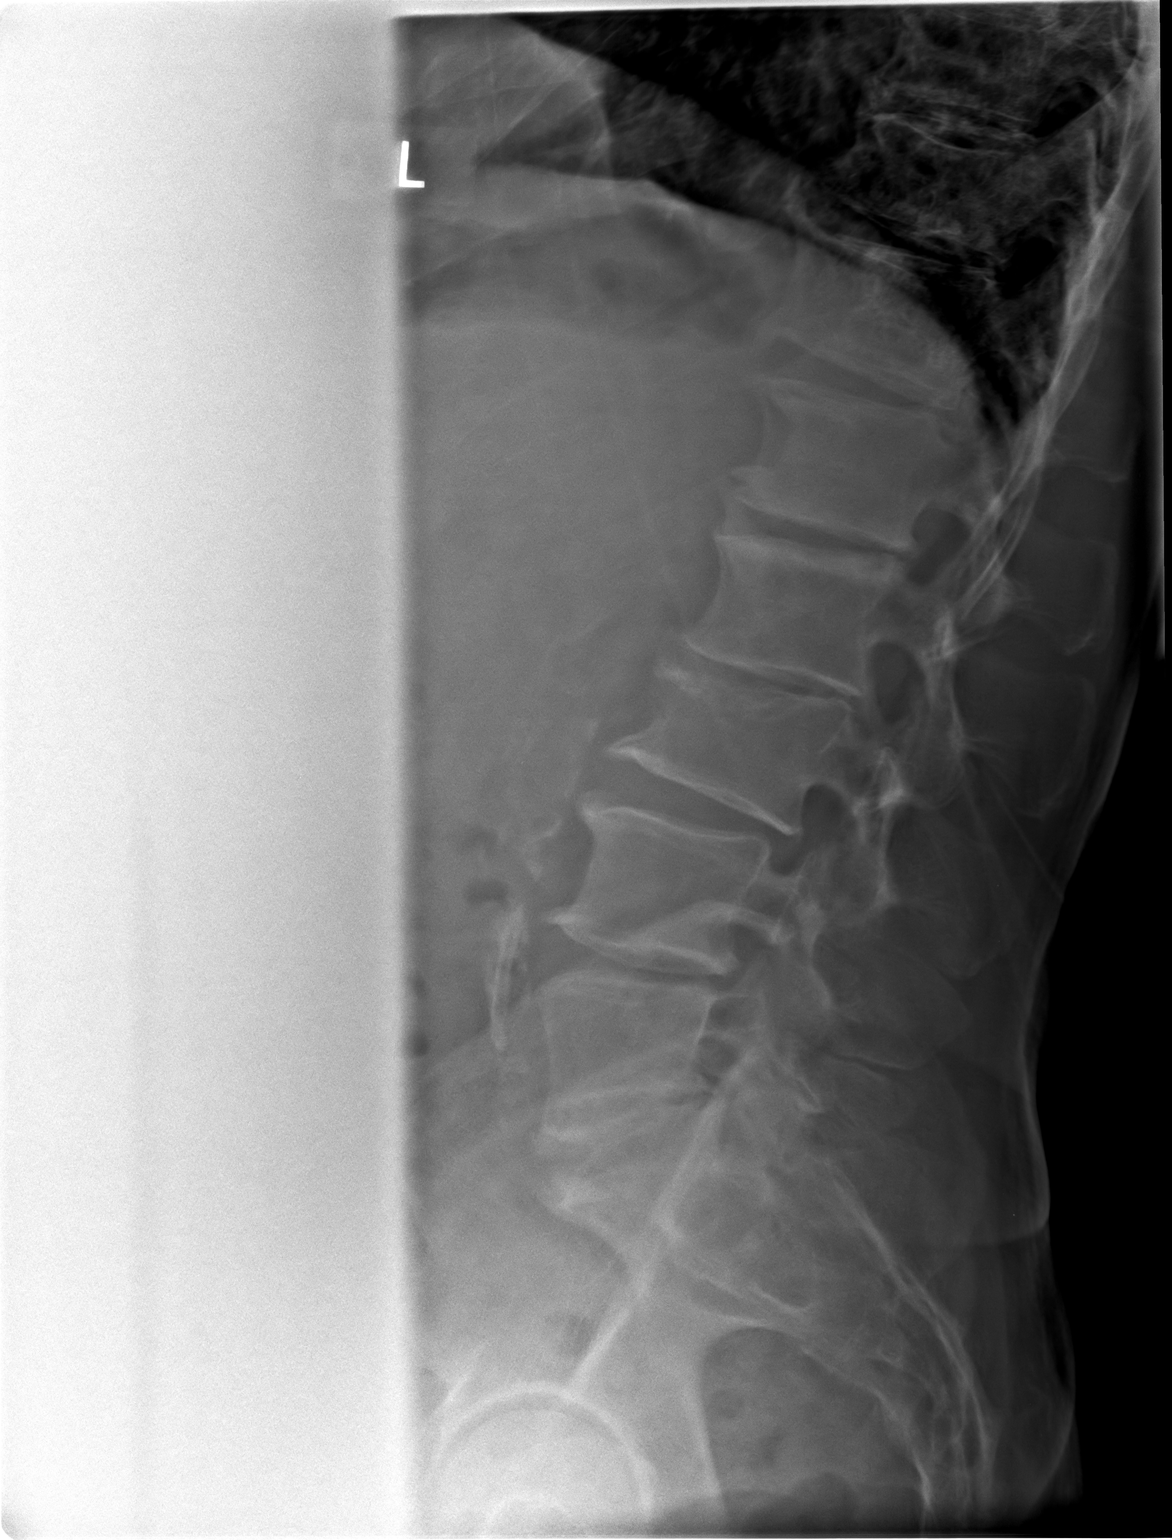

[2 of 2 positions shown; findings below may reference images not displayed]

FINDINGS: Frontal and lateral views were obtained. There are 5 non-rib-bearing
lumbar type vertebral bodies. There is age uncertain anterior
wedging of the L3 vertebral body. There is no other fracture. No
spondylolisthesis. There is moderately severe disc space narrowing
at L1-2, L2-3, L4-5, and L5-S1. There is moderate narrowing at L3-4.
No erosive change.
IMPRESSION: Multilevel osteoarthritic change. Mild anterior wedging of the L3
vertebral body, age uncertain. No spondylolisthesis.

## 2016-06-07 IMAGING — US US ABDOMEN LIMITED
1 series · 4 of 4 positions shown · non-contrast
Comparison: Multiple exams, including 12/31/2013 and 12/30/2013

CLINICAL DATA: Ascites.  Cirrhosis and hepatitis.

EXAM:
LIMITED ABDOMEN ULTRASOUND FOR ASCITES
TECHNIQUE: Limited ultrasound survey for ascites was performed in all four
abdominal quadrants.

[Series 1: us abdomen limited · 0.18mm/px · 4 of 4 slices shown]
[im 1/4]
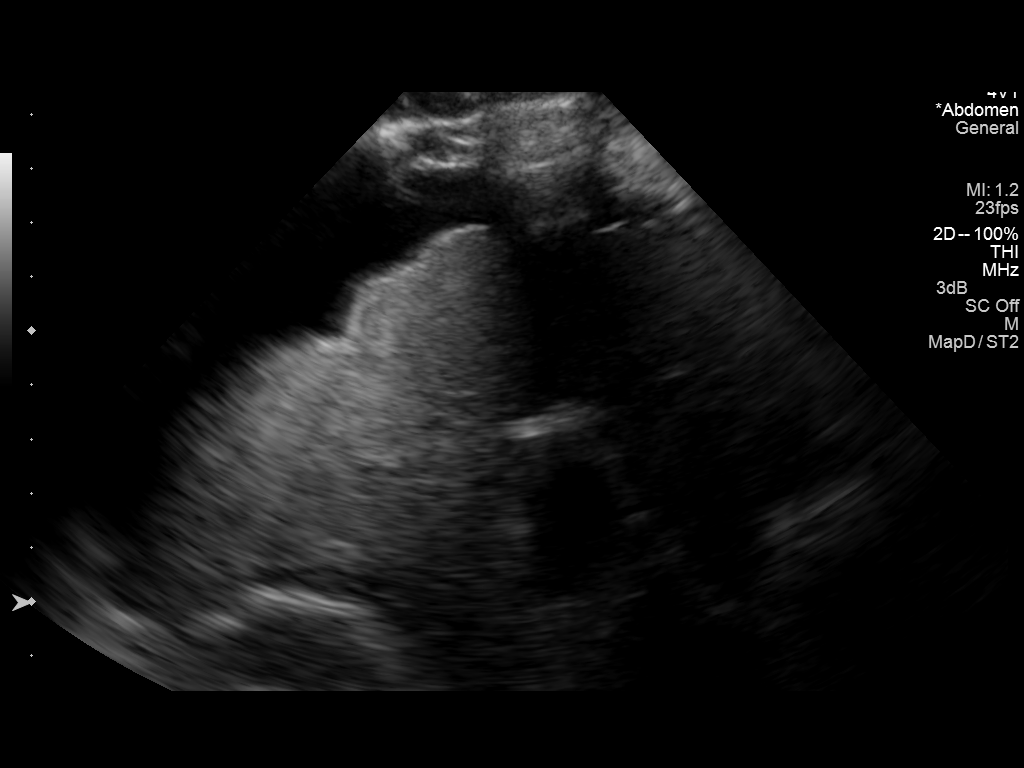
[im 2/4]
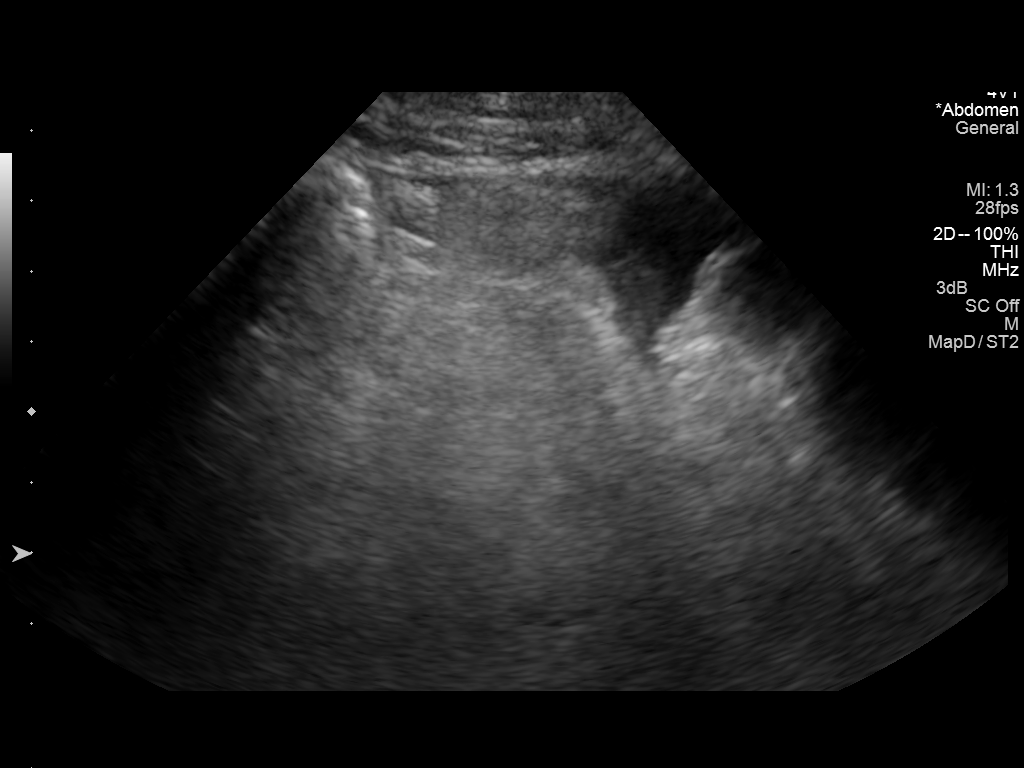
[im 3/4]
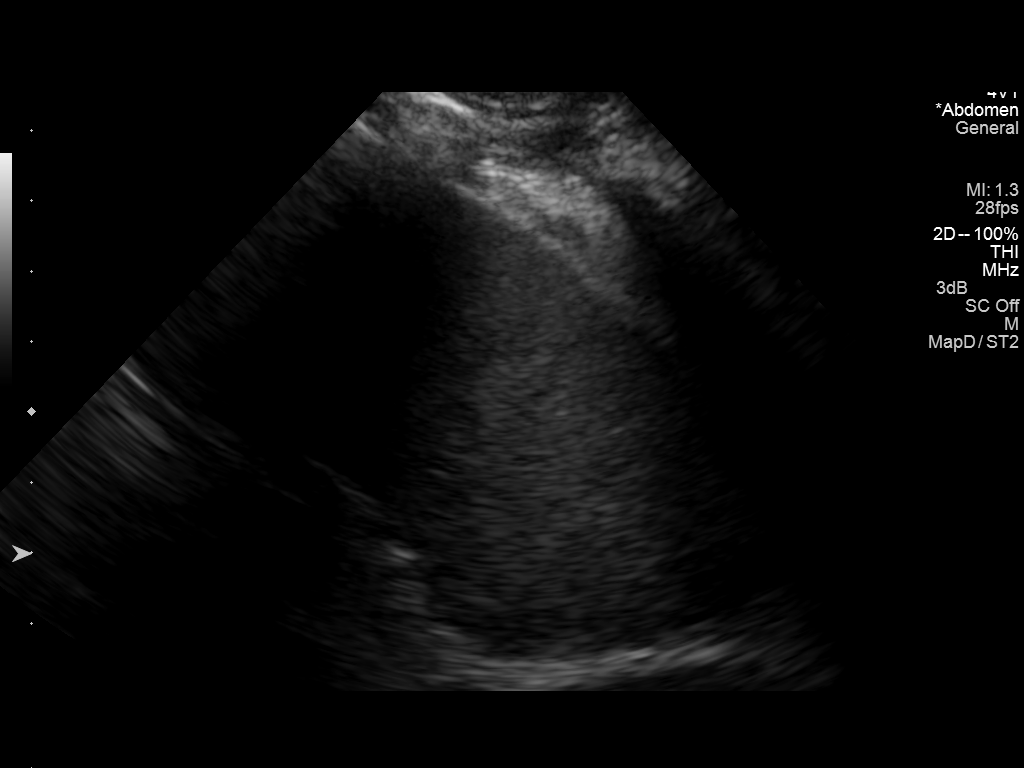
[im 4/4]
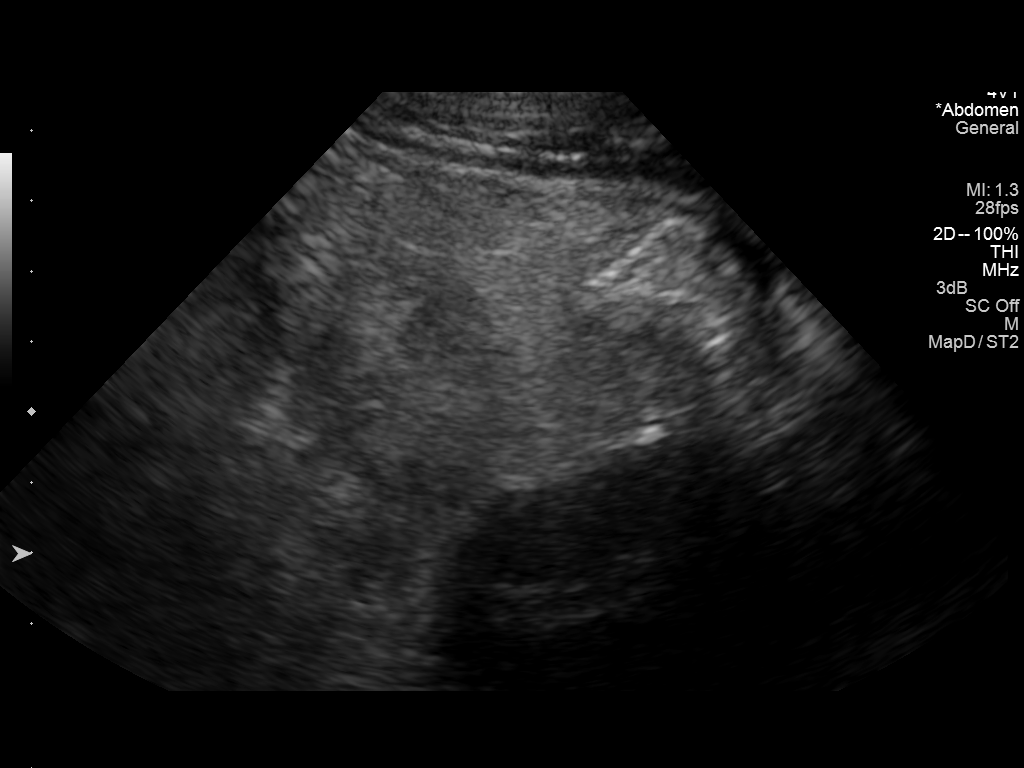

[4 of 4 positions shown; findings below may reference images not displayed]

FINDINGS: Small amount of ascites noted in the right upper quadrant, but not
enough to provide a therapeutic benefit if drained. Accordingly,
paracentesis was not performed.
IMPRESSION: 1. Small amount of ascites in the right upper quadrant, not judged
to provide a significant therapeutic benefit if drained.

## 2016-06-10 IMAGING — US US PARACENTESIS
1 series · 14 of 25 positions shown · non-contrast
Comparison: None.

CLINICAL DATA: Ascites

EXAM:
ULTRASOUND GUIDED THERAPEUTIC PARACENTESIS

[Series 1: us paracentesis · 0.24mm/px · 14 of 65 slices shown]
[im 1/65]
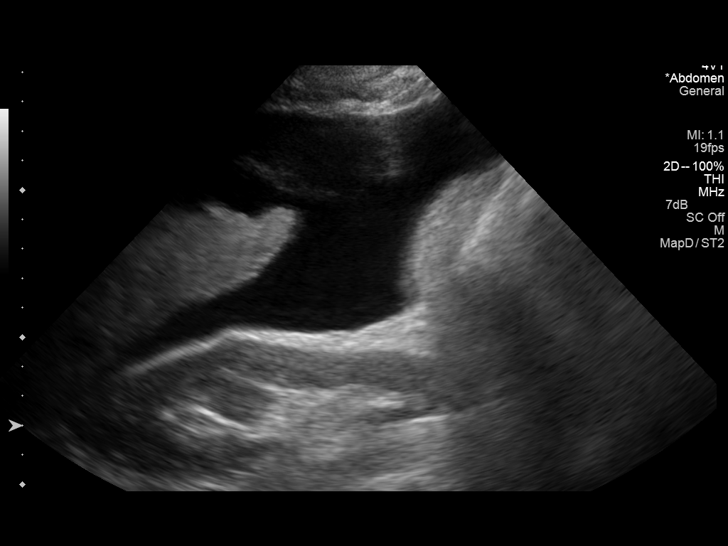
[im 6/65]
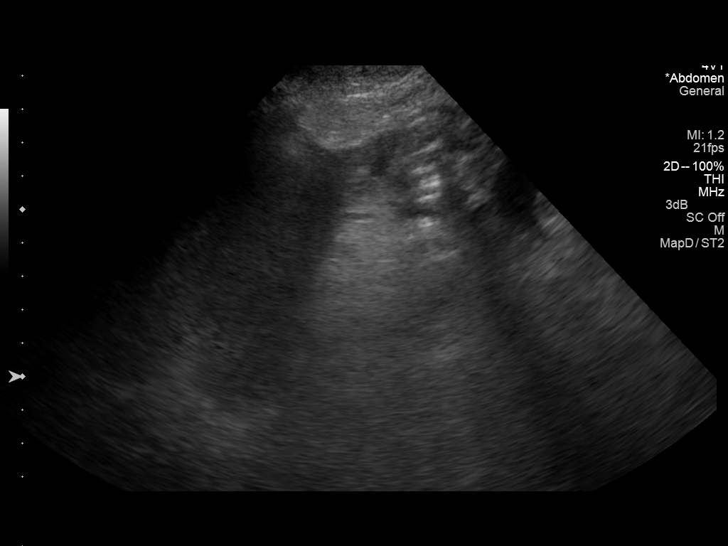
[im 11/65]
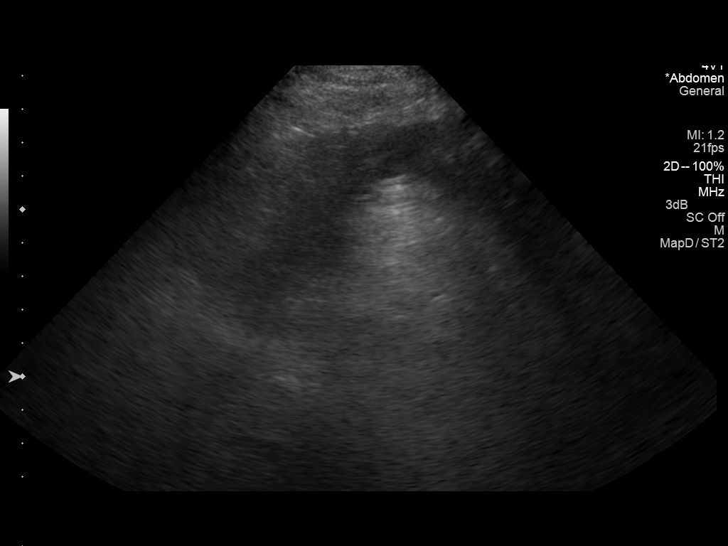
[im 17/65]
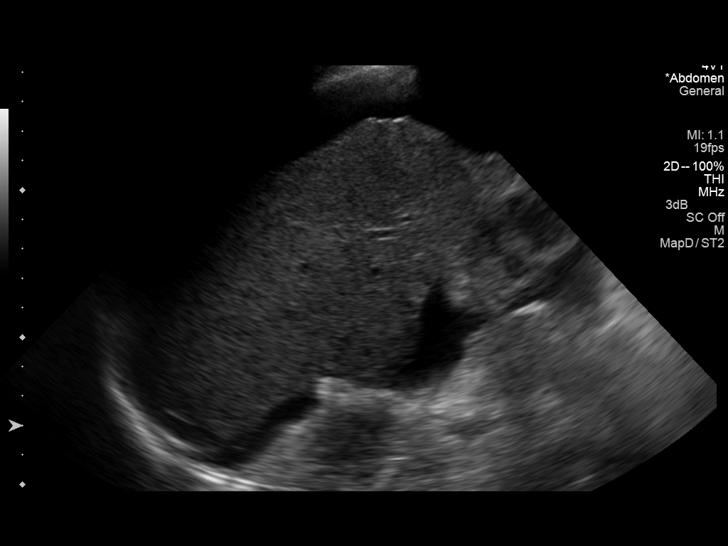
[im 22/65]
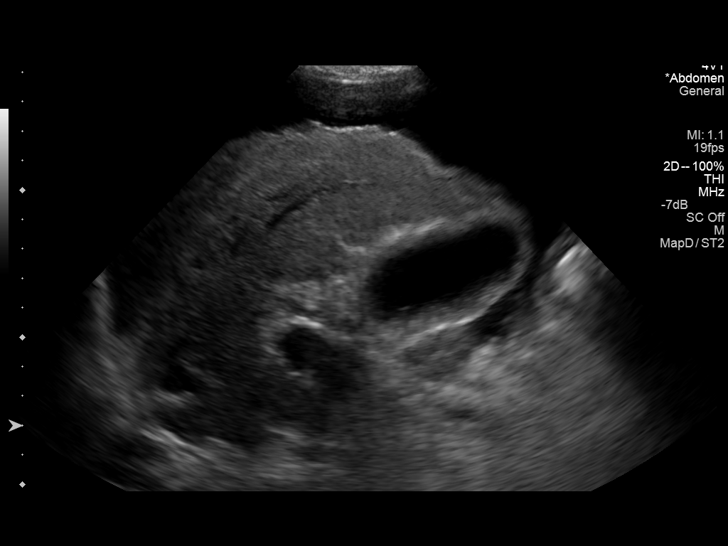
[im 25/65]
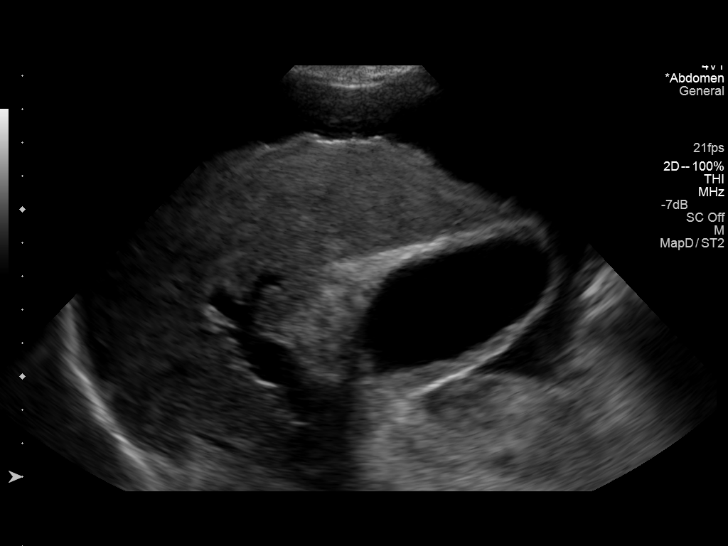
[im 30/65]
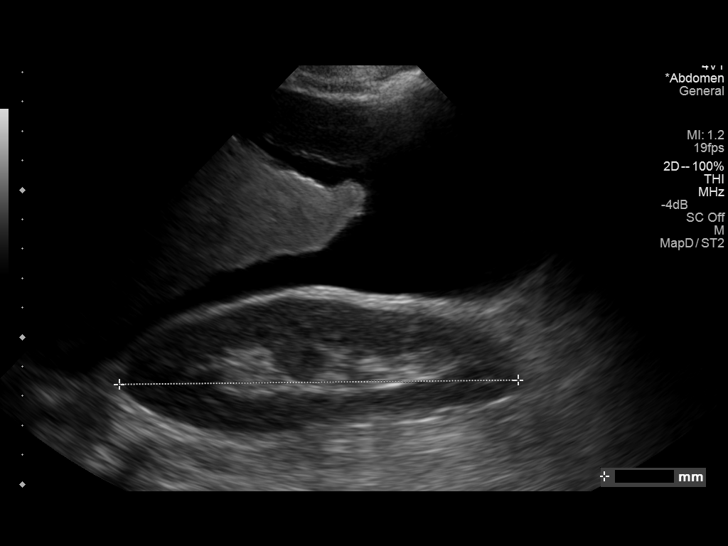
[im 35/65]
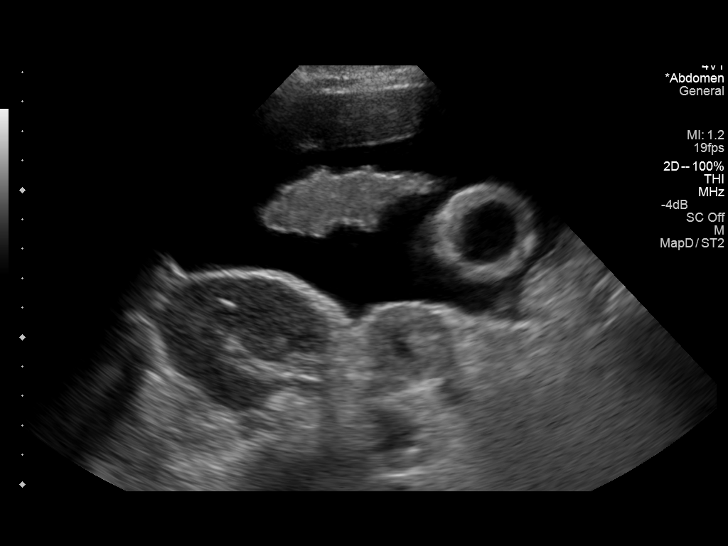
[im 41/65]
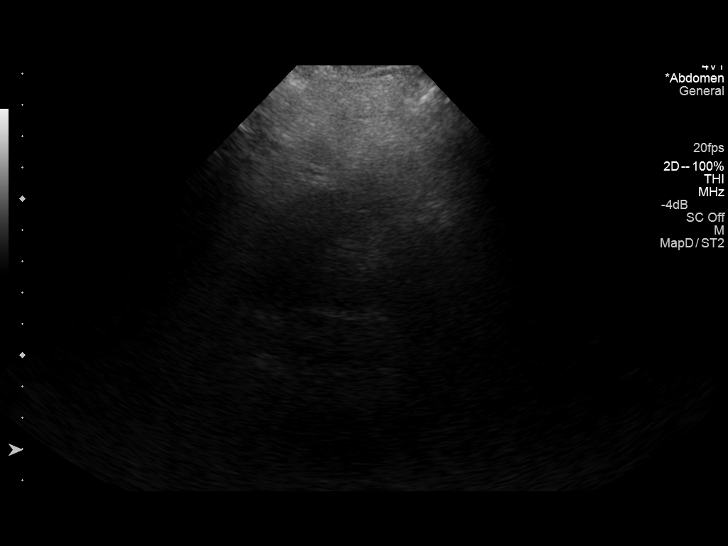
[im 43/65]
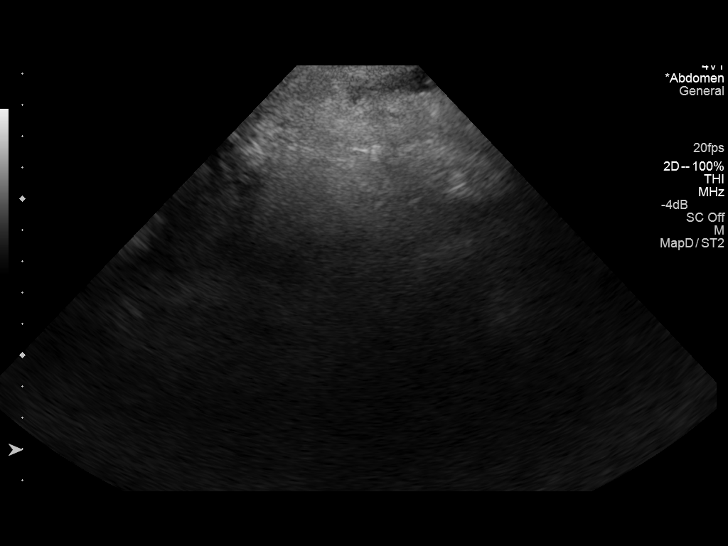
[im 49/65]
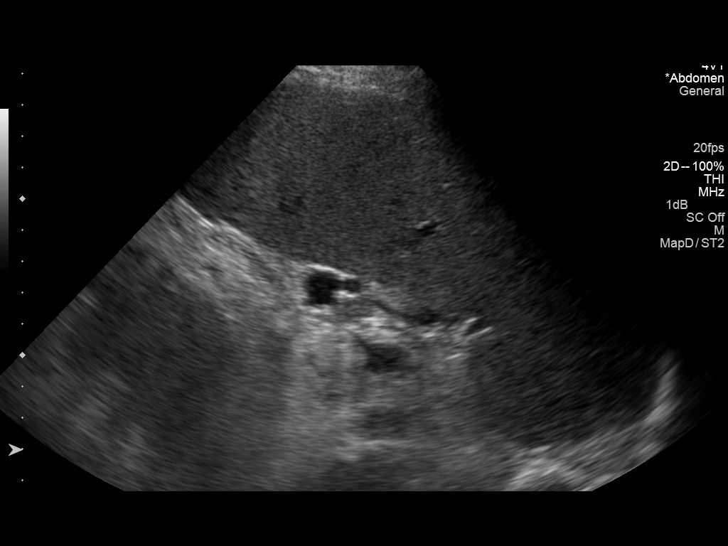
[im 54/65]
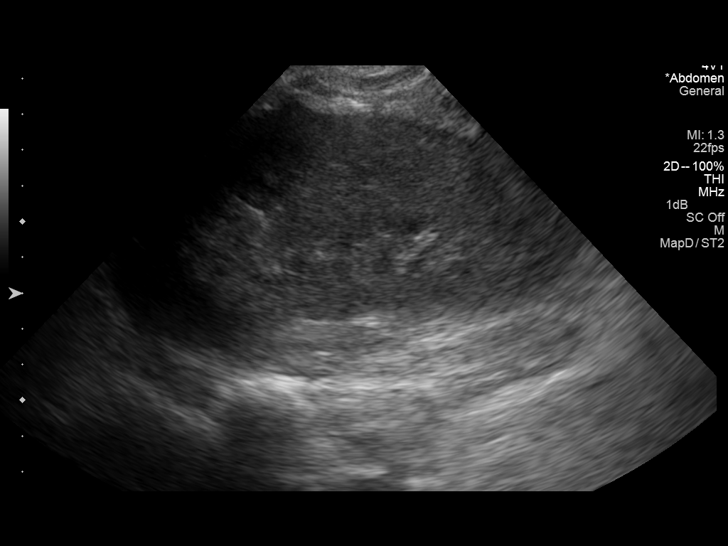
[im 59/65]
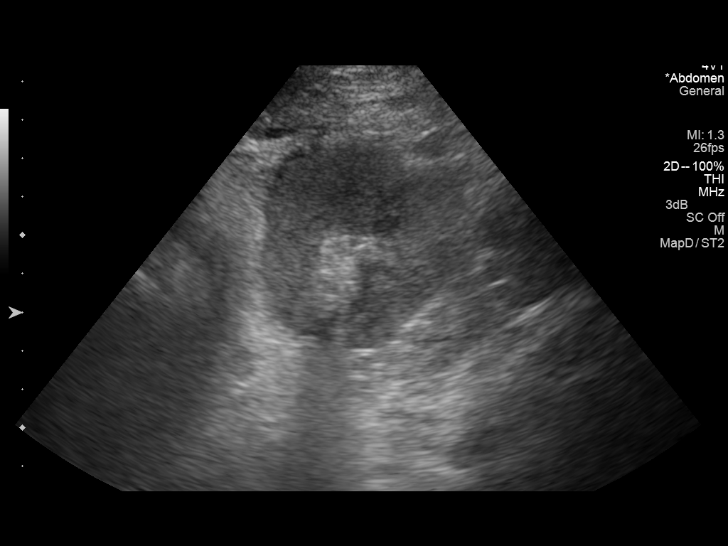
[im 65/65]
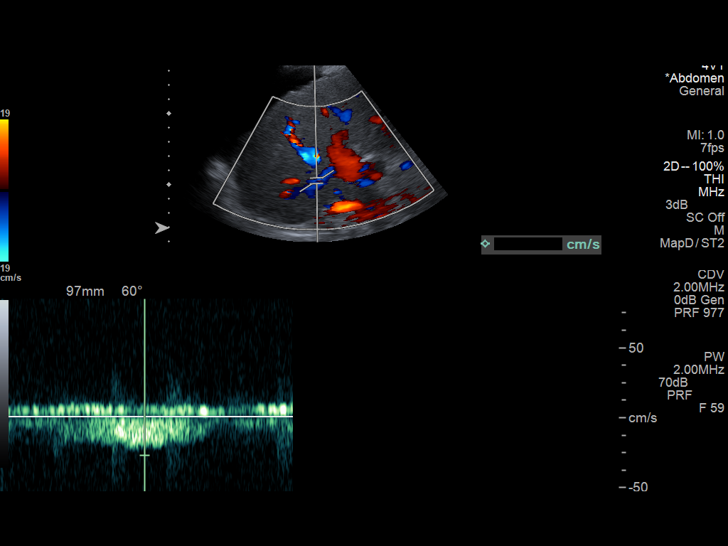

[14 of 25 positions shown; findings below may reference images not displayed]

PROCEDURE:
An ultrasound guided paracentesis was thoroughly discussed with the
patient and questions answered. The benefits, risks, alternatives
and complications were also discussed. The patient understands and
wishes to proceed with the procedure. Written consent was obtained.

Ultrasound was performed to localize and mark an adequate pocket of
fluid in the right quadrant of the abdomen. The area was then
prepped and draped in the normal sterile fashion. 1% Lidocaine was
used for local anesthesia. Under ultrasound guidance a 19 gauge Yueh
catheter was introduced. Paracentesis was performed. The catheter
was removed and a dressing applied.

Complications: None.
FINDINGS: A total of approximately 8533 mL of yellow fluid was removed. A
fluid sample was not sent for laboratory analysis.
IMPRESSION: Successful ultrasound guided paracentesis yielding 8533 mL of
ascites.

## 2016-06-28 IMAGING — US US PARACENTESIS
1 series · 5 of 5 positions shown · non-contrast
Comparison: 01/09/2014

CLINICAL DATA: Alcoholic cirrhosis, ascites

EXAM:
ULTRASOUND GUIDED DIAGNOSTIC AND THERAPEUTIC PARACENTESIS

[Series 1: us paracentesis · 0.21mm/px · 5 of 5 slices shown]
[im 1/5]
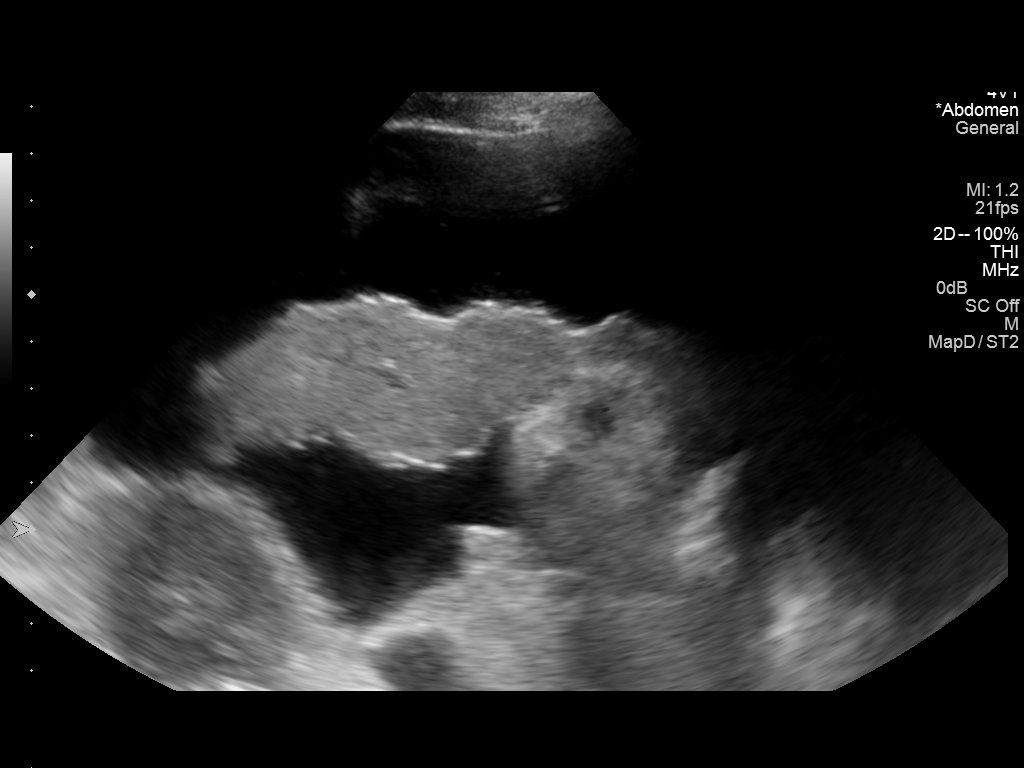
[im 2/5]
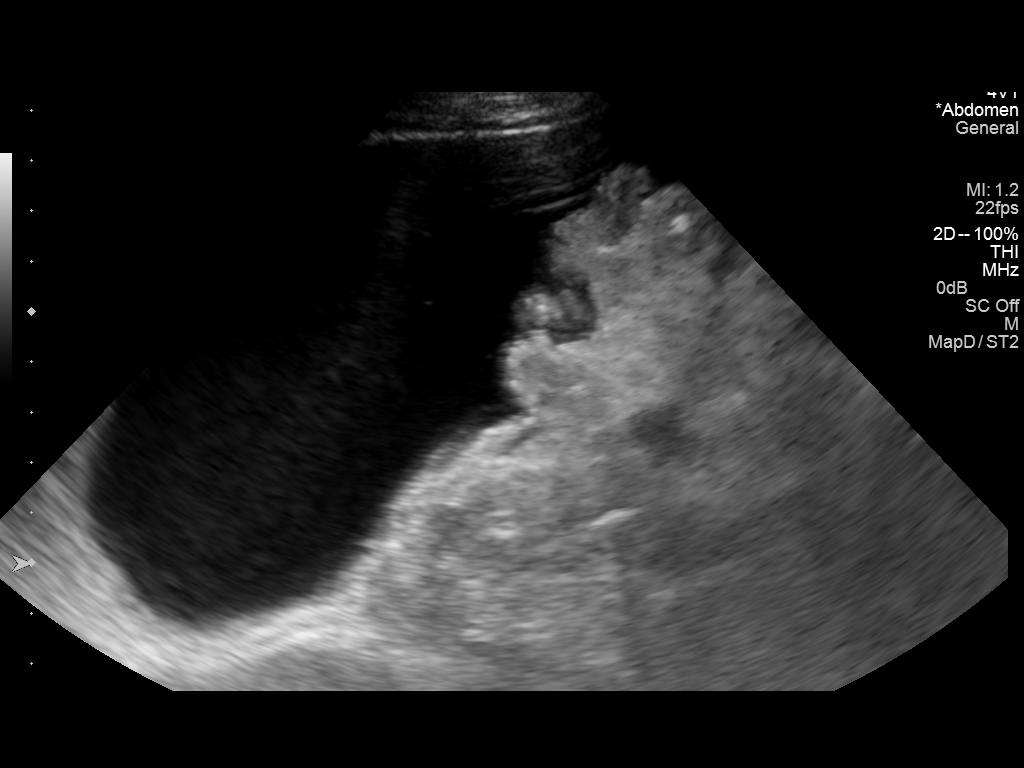
[im 3/5]
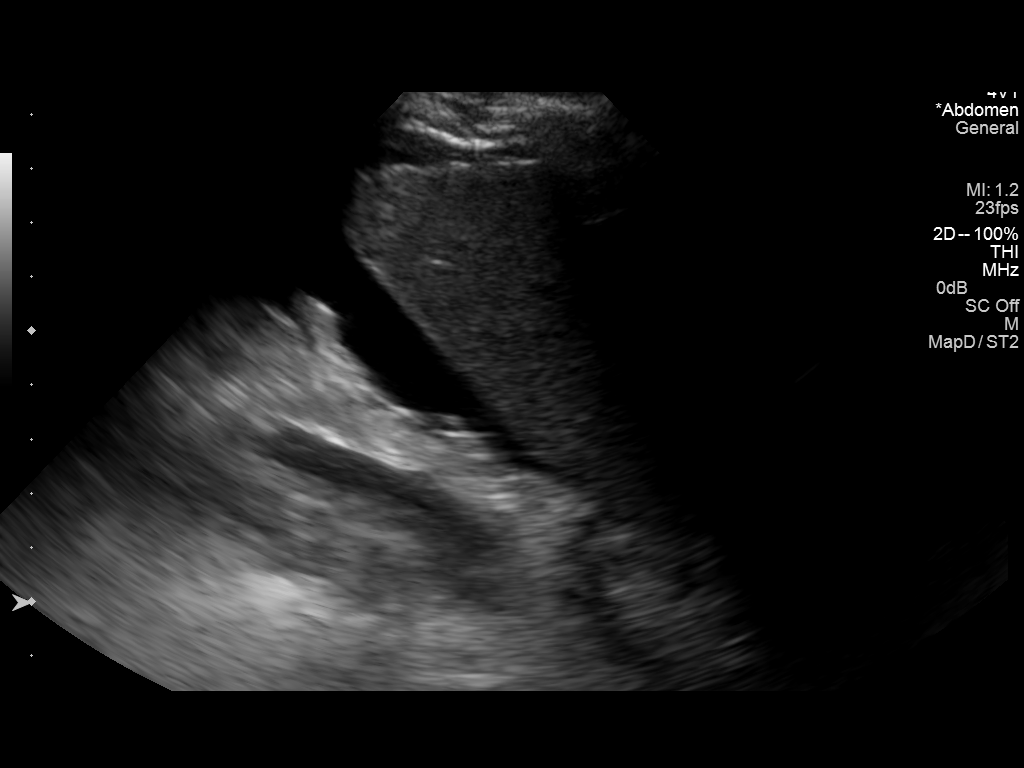
[im 4/5]
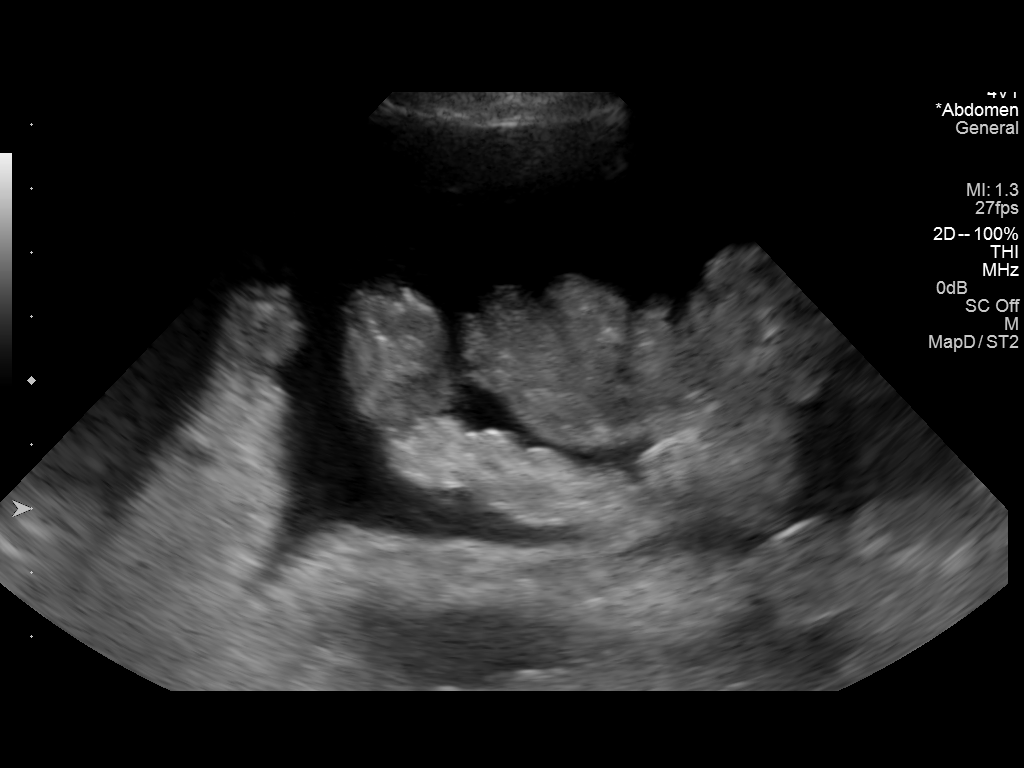
[im 5/5]
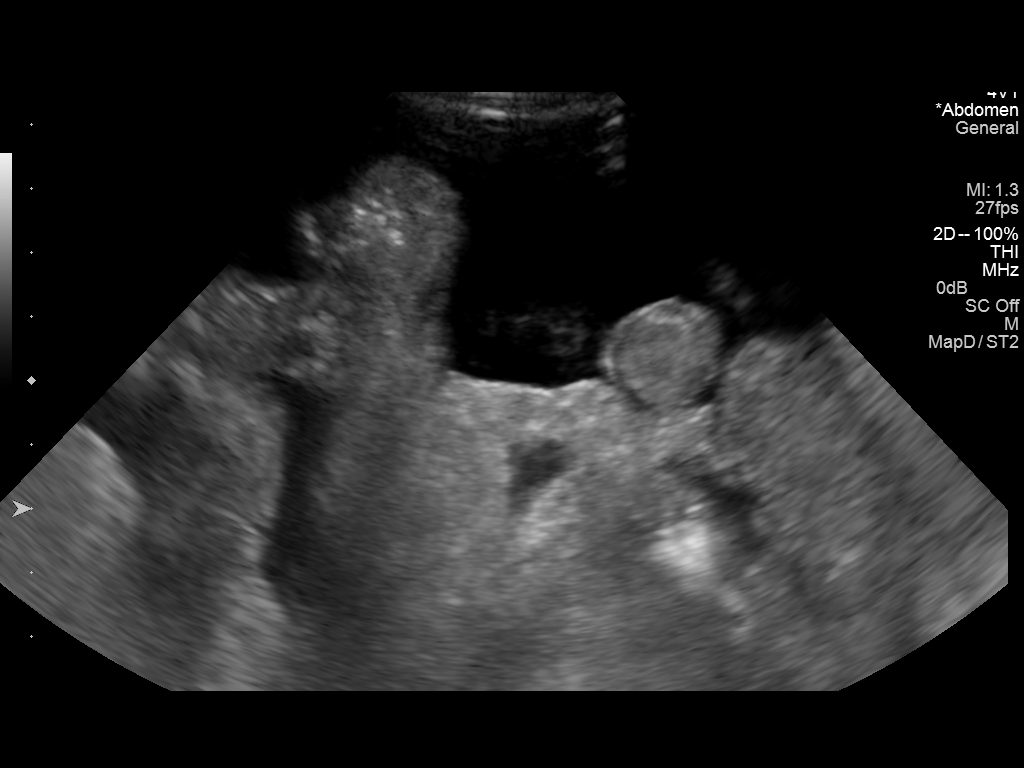

[5 of 5 positions shown; findings below may reference images not displayed]

PROCEDURE:
Procedure, benefits, and risks of procedure were discussed with
patient.

Written informed consent for procedure was obtained.

Time out protocol followed.

Adequate collection of ascites localized by ultrasound in RIGHT
lower quadrant.

Skin prepped and draped in usual sterile fashion.

Skin and soft tissues anesthetized with 10 mL of 1% lidocaine.

5 French Yueh catheter placed into peritoneal cavity.

7322 mL of yellow fluid aspirated by vacuum bottle suction.

Procedure tolerated well by patient without immediate complication.
FINDINGS: A total of approximately 7322 mL of ascitic fluid was removed. A
fluid sample of 180 mL was sent for laboratory analysis.
IMPRESSION: Successful ultrasound guided paracentesis yielding 7322 mL of
ascites.

## 2016-11-30 NOTE — Progress Notes (Signed)
REVIEWED. PT DECEASED.
